# Patient Record
Sex: Male | Born: 1950
Health system: Southern US, Community
[De-identification: ages and names within clinical notes are randomized; demographics above are authoritative.]

## PROBLEM LIST (undated history)

## (undated) DIAGNOSIS — E785 Hyperlipidemia, unspecified: Secondary | ICD-10-CM

## (undated) DIAGNOSIS — E119 Type 2 diabetes mellitus without complications: Secondary | ICD-10-CM

## (undated) DIAGNOSIS — Z8701 Personal history of pneumonia (recurrent): Secondary | ICD-10-CM

## (undated) DIAGNOSIS — J45909 Unspecified asthma, uncomplicated: Secondary | ICD-10-CM

## (undated) DIAGNOSIS — R011 Cardiac murmur, unspecified: Secondary | ICD-10-CM

## (undated) DIAGNOSIS — T7840XA Allergy, unspecified, initial encounter: Secondary | ICD-10-CM

## (undated) DIAGNOSIS — I2510 Atherosclerotic heart disease of native coronary artery without angina pectoris: Secondary | ICD-10-CM

## (undated) DIAGNOSIS — I10 Essential (primary) hypertension: Secondary | ICD-10-CM

## (undated) DIAGNOSIS — I219 Acute myocardial infarction, unspecified: Secondary | ICD-10-CM

## (undated) HISTORY — DX: Atherosclerotic heart disease of native coronary artery without angina pectoris: I25.10

## (undated) HISTORY — DX: Hyperlipidemia, unspecified: E78.5

## (undated) HISTORY — DX: Essential (primary) hypertension: I10

## (undated) HISTORY — DX: Allergy, unspecified, initial encounter: T78.40XA

## (undated) HISTORY — DX: Type 2 diabetes mellitus without complications: E11.9

## (undated) HISTORY — PX: COLONOSCOPY: SHX174

## (undated) HISTORY — DX: Cardiac murmur, unspecified: R01.1

## (undated) HISTORY — DX: Acute myocardial infarction, unspecified: I21.9

## (undated) HISTORY — PX: UPPER GASTROINTESTINAL ENDOSCOPY: SHX188

## (undated) HISTORY — DX: Unspecified asthma, uncomplicated: J45.909

## (undated) HISTORY — PX: ADENOIDECTOMY: SUR15

## (undated) HISTORY — PX: VASECTOMY: SHX75

## (undated) HISTORY — PX: TONSILLECTOMY: SUR1361

---

## 2002-07-01 HISTORY — PX: TYMPANIC MEMBRANE REPAIR: SHX294

## 2009-07-01 DIAGNOSIS — I219 Acute myocardial infarction, unspecified: Secondary | ICD-10-CM

## 2009-07-01 HISTORY — PX: CORONARY STENT PLACEMENT: SHX1402

## 2009-07-01 HISTORY — DX: Acute myocardial infarction, unspecified: I21.9

## 2009-07-01 HISTORY — PX: CORONARY ARTERY BYPASS GRAFT: SHX141

## 2009-10-02 ENCOUNTER — Emergency Department (HOSPITAL_COMMUNITY): Admission: EM | Admit: 2009-10-02 | Discharge: 2009-10-02 | Payer: Self-pay | Admitting: Family Medicine

## 2009-10-02 ENCOUNTER — Telehealth: Payer: Self-pay | Admitting: Internal Medicine

## 2009-10-02 ENCOUNTER — Ambulatory Visit: Payer: Self-pay | Admitting: Cardiovascular Disease

## 2009-10-02 ENCOUNTER — Inpatient Hospital Stay (HOSPITAL_COMMUNITY): Admission: RE | Admit: 2009-10-02 | Discharge: 2009-10-05 | Payer: Self-pay | Admitting: Cardiovascular Disease

## 2009-10-04 ENCOUNTER — Encounter: Payer: Self-pay | Admitting: Cardiovascular Disease

## 2009-10-09 ENCOUNTER — Ambulatory Visit: Payer: Self-pay | Admitting: Internal Medicine

## 2009-10-09 DIAGNOSIS — E119 Type 2 diabetes mellitus without complications: Secondary | ICD-10-CM

## 2009-10-09 DIAGNOSIS — E785 Hyperlipidemia, unspecified: Secondary | ICD-10-CM

## 2009-10-09 DIAGNOSIS — I252 Old myocardial infarction: Secondary | ICD-10-CM | POA: Insufficient documentation

## 2009-10-09 DIAGNOSIS — I10 Essential (primary) hypertension: Secondary | ICD-10-CM

## 2009-10-11 DIAGNOSIS — I2510 Atherosclerotic heart disease of native coronary artery without angina pectoris: Secondary | ICD-10-CM | POA: Insufficient documentation

## 2009-10-31 ENCOUNTER — Ambulatory Visit: Payer: Self-pay | Admitting: Cardiovascular Disease

## 2009-11-23 ENCOUNTER — Telehealth: Payer: Self-pay | Admitting: Internal Medicine

## 2010-01-17 ENCOUNTER — Ambulatory Visit: Payer: Self-pay | Admitting: Internal Medicine

## 2010-01-17 LAB — CONVERTED CEMR LAB
ALT: 27 units/L (ref 0–53)
AST: 19 units/L (ref 0–37)
Albumin: 4.1 g/dL (ref 3.5–5.2)
BUN: 21 mg/dL (ref 6–23)
Bilirubin, Direct: 0.2 mg/dL (ref 0.0–0.3)
Calcium: 9 mg/dL (ref 8.4–10.5)
Cholesterol, target level: 200 mg/dL
HDL goal, serum: 40 mg/dL
LDL Goal: 70 mg/dL
Sodium: 140 meq/L (ref 135–145)
Total Bilirubin: 0.7 mg/dL (ref 0.3–1.2)

## 2010-01-22 ENCOUNTER — Encounter: Payer: Self-pay | Admitting: Internal Medicine

## 2010-01-23 ENCOUNTER — Encounter: Payer: Self-pay | Admitting: Cardiovascular Disease

## 2010-04-18 ENCOUNTER — Ambulatory Visit: Payer: Self-pay | Admitting: Internal Medicine

## 2010-04-18 ENCOUNTER — Telehealth: Payer: Self-pay | Admitting: Internal Medicine

## 2010-04-18 LAB — CONVERTED CEMR LAB
ALT: 26 units/L (ref 0–53)
AST: 19 units/L (ref 0–37)
BUN: 19 mg/dL (ref 6–23)
Basophils Absolute: 0 10*3/uL (ref 0.0–0.1)
Calcium: 9.4 mg/dL (ref 8.4–10.5)
Cholesterol: 77 mg/dL (ref 0–200)
Creatinine, Ser: 1.1 mg/dL (ref 0.4–1.5)
Glucose, Bld: 187 mg/dL — ABNORMAL HIGH (ref 70–99)
HCT: 43.4 % (ref 39.0–52.0)
Hemoglobin, Urine: NEGATIVE
Hemoglobin: 14.8 g/dL (ref 13.0–17.0)
Leukocytes, UA: NEGATIVE
Lymphs Abs: 1.7 10*3/uL (ref 0.7–4.0)
MCHC: 34.1 g/dL (ref 30.0–36.0)
Monocytes Absolute: 0.5 10*3/uL (ref 0.1–1.0)
Neutro Abs: 3.2 10*3/uL (ref 1.4–7.7)
Nitrite: NEGATIVE
PSA: 0.48 ng/mL (ref 0.10–4.00)
RBC: 5.26 M/uL (ref 4.22–5.81)
RDW: 13.6 % (ref 11.5–14.6)
Specific Gravity, Urine: <=1.005 (ref 1.000–1.030)
Total Protein, Urine: NEGATIVE mg/dL
Triglycerides: 84 mg/dL (ref 0.0–149.0)
VLDL: 16.8 mg/dL (ref 0.0–40.0)
WBC: 5.6 10*3/uL (ref 4.5–10.5)
pH: 6 (ref 5.0–8.0)

## 2010-05-18 ENCOUNTER — Ambulatory Visit (HOSPITAL_COMMUNITY): Admission: RE | Admit: 2010-05-18 | Discharge: 2010-05-18 | Payer: Self-pay | Admitting: Cardiovascular Disease

## 2010-05-18 ENCOUNTER — Ambulatory Visit: Payer: Self-pay | Admitting: Cardiovascular Disease

## 2010-05-18 ENCOUNTER — Ambulatory Visit: Payer: Self-pay | Admitting: Cardiology

## 2010-05-18 DIAGNOSIS — I2589 Other forms of chronic ischemic heart disease: Secondary | ICD-10-CM

## 2010-06-19 ENCOUNTER — Ambulatory Visit: Payer: Self-pay | Admitting: Internal Medicine

## 2010-07-03 ENCOUNTER — Encounter: Payer: Self-pay | Admitting: Internal Medicine

## 2010-07-04 ENCOUNTER — Encounter: Payer: Self-pay | Admitting: Internal Medicine

## 2010-07-31 NOTE — Letter (Signed)
Summary: Results Follow-up Letter  [facility]  [address] [place], [place] [address]   Phone: [phone]  Fax: [phone]    01/17/2010  [address] [place], [place]  [address]  Dear Mr. Babe,   The following are the results of your recent test(s):  Test     Result     Blood sugars   still too high Liver/kidney   normal   _________________________________________________________  Please call for an appointment soon to work on the Diabetes _________________________________________________________ _________________________________________________________ _________________________________________________________  Sincerely,  Sanda Linger MD [facility]

## 2010-07-31 NOTE — Progress Notes (Signed)
  Phone Note Refill Request Message from:  Fax from Pharmacy on Nov 23, 2009 10:54 AM  Refills Requested: Medication #1:  METFORMIN HCL 500 MG TABS Take 1 tablet by mouth two times a day Initial call taken by: Rock Nephew CMA,  Nov 23, 2009 10:54 AM    Prescriptions: METFORMIN HCL 500 MG TABS (METFORMIN HCL) Take 1 tablet by mouth two times a day  #60 x 5   Entered by:   Rock Nephew CMA   Authorized by:   Etta Grandchild MD   Signed by:   Rock Nephew CMA on 11/23/2009   Method used:   Electronically to        [id number] (retail)       [address]       [place], [place]  [address]       Ph: [phone] or [phone]       Fax: [phone]   RxID:   [id number]

## 2010-07-31 NOTE — Assessment & Plan Note (Signed)
Summary: CPX/WILL COME FASTING/CD   Vital Signs:  Patient profile:   60 year old male Height:      69 inches Weight:      197 pounds BMI:     29.20 O2 Sat:      97 % on Room air Temp:     98.2 degrees F oral Pulse rate:   56 / minute Pulse rhythm:   regular Resp:     16 per minute BP sitting:   122 / 76  (left arm) Cuff size:   large  Vitals Entered By: Rock Nephew CMA (April 18, 2010 9:04 AM)  Nutrition Counseling: Patient's BMI is greater than 25 and therefore counseled on weight management options.  O2 Flow:  Room air CC: CPX w/ labs, Preventive Care, Lipid Management, Hypertension Management Is Patient Diabetic? Yes Did you bring your meter with you today? No Pain Assessment Patient in pain? no       Does patient need assistance? Functional Status Self care Ambulation Normal   Primary Care Provider:  Etta Grandchild MD  CC:  CPX w/ labs, Preventive Care, Lipid Management, and Hypertension Management.  History of Present Illness: He returns for a complete physical but he also expresses concern about his blood sugar being consistently around 200 for the last few months.  Hypertension History:      He denies headache, chest pain, palpitations, dyspnea with exertion, orthopnea, PND, peripheral edema, visual symptoms, neurologic problems, syncope, and side effects from treatment.  He notes no problems with any antihypertensive medication side effects.        Positive major cardiovascular risk factors include male age 78 years old or older, diabetes, hyperlipidemia, and hypertension.  Negative major cardiovascular risk factors include negative family history for ischemic heart disease and non-tobacco-user status.        Positive history for target organ damage include ASHD (either angina/prior MI/prior CABG).  Further assessment for target organ damage reveals no history of cardiac end-organ damage (CHF/LVH), stroke/TIA, peripheral vascular disease, renal  insufficiency, or hypertensive retinopathy.    Lipid Management History:      Positive NCEP/ATP III risk factors include male age 45 years old or older, diabetes, hypertension, and ASHD (either angina/prior MI/prior CABG).  Negative NCEP/ATP III risk factors include no family history for ischemic heart disease, non-tobacco-user status, no prior stroke/TIA, no peripheral vascular disease, and no history of aortic aneurysm.        The patient states that he knows about the "Therapeutic Lifestyle Change" diet.  His compliance with the TLC diet is good.  The patient expresses understanding of adjunctive measures for cholesterol lowering.  Adjunctive measures started by the patient include aerobic exercise, fiber, omega-3 supplements, limit alcohol consumpton, and weight reduction.  He expresses no side effects from his lipid-lowering medication.  The patient denies any symptoms to suggest myopathy or liver disease.     Preventive Screening-Counseling & Management  Alcohol-Tobacco     Alcohol drinks/day: <1     Alcohol type: all     >5/day in last 3 mos: no     Alcohol Counseling: not indicated; use of alcohol is not excessive or problematic     Feels need to cut down: no     Feels annoyed by complaints: no     Feels guilty re: drinking: no     Needs 'eye opener' in am: no     Smoking Status: quit     Tobacco Counseling: to remain off tobacco products  Hep-HIV-STD-Contraception     Hepatitis Risk: no risk noted     HIV Risk: no risk noted     STD Risk: no risk noted     Dental Visit-last 6 months yes     Dental Care Counseling: to seek dental care; no dental care within six months     TSE monthly: yes     Testicular SE Education/Counseling to perform regular STE     Sun Exposure-Excessive: yes  Safety-Violence-Falls     Seat Belt Use: yes     Helmet Use: n/a     Firearms in the Home: no firearms in the home     Smoke Detectors: yes     Violence in the Home: no risk noted     Sexual  Abuse: no      Sexual History:  currently monogamous.        Drug Use:  no.        Blood Transfusions:  no.    Clinical Review Panels:  Prevention   Last Colonoscopy:  Normal; [facility] in [place]-no records yet (04/16/2005)  Immunizations   Last Tetanus Booster:  Historical (01/17/2010)  Diabetes Management   HgBA1C:  8.7 (01/17/2010)   Creatinine:  1.2 (01/17/2010)   Last Dilated Eye Exam:  normal (01/22/2010)   Last Foot Exam:  yes (04/18/2010)  Complete Metabolic Panel   Glucose:  140 (01/17/2010)   Sodium:  140 (01/17/2010)   Potassium:  4.9 (01/17/2010)   Chloride:  109 (01/17/2010)   CO2:  28 (01/17/2010)   BUN:  21 (01/17/2010)   Creatinine:  1.2 (01/17/2010)   Albumin:  4.1 (01/17/2010)   Total Protein:  6.3 (01/17/2010)   Calcium:  9.0 (01/17/2010)   Total Bili:  0.7 (01/17/2010)   Alk Phos:  55 (01/17/2010)   SGPT (ALT):  27 (01/17/2010)   SGOT (AST):  19 (01/17/2010)   Medications Prior to Update: 1)  Coreg 25 Mg Tabs (Carvedilol) .... Take 1 Tablet By Mouth Two Times A Day 2)  Lisinopril 10 Mg Tabs (Lisinopril) .... Take 1 Tablet By Mouth Once A Day 3)  Metformin Hcl 500 Mg Tabs (Metformin Hcl) .... Take 1 Tablet By Mouth Two Times A Day 4)  Nitrostat 0.4 Mg Subl (Nitroglycerin) .... As Needed Chest Pain 5)  Effient 10 Mg Tabs (Prasugrel Hcl) .... Take 1 Tablet By Mouth Once A Day 6)  Crestor 40 Mg Tabs (Rosuvastatin Calcium) .... Take 1 Tablet By Mouth Once A Day 7)  Aspirin Ec 325 Mg Tbec (Aspirin) .... Take One Tablet By Mouth Daily 8)  Fish Oil 1000 Mg Caps (Omega-3 Fatty Acids)  Current Medications (verified): 1)  Coreg 25 Mg Tabs (Carvedilol) .... Take 1 Tablet By Mouth Two Times A Day 2)  Lisinopril 10 Mg Tabs (Lisinopril) .... Take 1 Tablet By Mouth Once A Day 3)  Metformin Hcl 500 Mg Tabs (Metformin Hcl) .... Take 1 Tablet By Mouth Two Times A Day 4)  Nitrostat 0.4 Mg Subl (Nitroglycerin) .... As Needed Chest Pain 5)  Effient 10 Mg Tabs  (Prasugrel Hcl) .... Take 1 Tablet By Mouth Once A Day 6)  Crestor 40 Mg Tabs (Rosuvastatin Calcium) .... Take 1 Tablet By Mouth Once A Day 7)  Aspirin Ec 325 Mg Tbec (Aspirin) .... Take One Tablet By Mouth Daily 8)  Fish Oil 1000 Mg Caps (Omega-3 Fatty Acids) 9)  Kombiglyze Xr 10-998 Mg Xr24h-Tab (Saxagliptin-Metformin) .... One By Mouth Once Daily For Diabetes  Allergies (verified): No Known  Drug Allergies  Past History:  Past Medical History: Last updated: 10/31/2009 MYOCARDIAL INFARCTION, HX OF (ICD-412) CORONARY ARTERY DISEASE (ICD-414.00) s/p lateral MI 4/11, s/p Promus DES OM2. HYPERTENSION (ICD-401.9) HYPERLIPIDEMIA (ICD-272.4) FAMILY HISTORY DIABETES 1ST DEGREE RELATIVE (ICD-V18.0) FAMILY HISTORY OF CAD MALE 1ST DEGREE RELATIVE <50 (ICD-V17.3) DIABETES MELLITUS, TYPE II (ICD-250.00)    Past Surgical History: Last updated: 10/31/2009 PTCA/stent Vasectomy Tonsillectomy Adenoidectomy Eardrum repair Wisdom tooth removal  Family History: Last updated: 10/31/2009 Family History of CAD Male 1st degree relative <50 Family History Diabetes 1st degree relative Family History Hypertension Mother alive, Aortic valve and root replacement Father-deceased age 82 MI Sister alive, CAD, MI Brother alive, CAD, MI Brother deceased MVA  Social History: Last updated: 10/31/2009 Occupation: Writer Former tobacco-stopped 1979 Married, 3 children Alcohol use-yes, social Drug use-no Regular exercise-yes  Risk Factors: Alcohol Use: <1 (04/18/2010) >5 drinks/d w/in last 3 months: no (04/18/2010) Exercise: yes (10/09/2009)  Risk Factors: Smoking Status: quit (04/18/2010)  Family History: Reviewed history from 10/31/2009 and no changes required. Family History of CAD Male 1st degree relative <50 Family History Diabetes 1st degree relative Family History Hypertension Mother alive, Aortic valve and root replacement Father-deceased age 59 MI Sister alive,  CAD, MI Brother alive, CAD, MI Brother deceased MVA  Social History: Reviewed history from 10/31/2009 and no changes required. 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  The patient denies anorexia, fever, weight loss, chest pain, syncope, dyspnea on exertion, peripheral edema, prolonged cough, headaches, hemoptysis, abdominal pain, melena, hematochezia, severe indigestion/heartburn, hematuria, suspicious skin lesions, transient blindness, difficulty walking, depression, abnormal bleeding, enlarged lymph nodes, angioedema, and testicular masses.   GU:  Denies decreased libido, discharge, dysuria, erectile dysfunction, genital sores, hematuria, incontinence, nocturia, urinary frequency, and urinary hesitancy. Endo:  Denies cold intolerance, excessive hunger, excessive thirst, excessive urination, heat intolerance, and polyuria.  Physical Exam  General:  alert, well-developed, well-nourished, well-hydrated, and overweight-appearing.   Head:  normocephalic, atraumatic, no abnormalities observed, and no abnormalities palpated.   Eyes:  No corneal or conjunctival inflammation noted. EOMI. Perrla. Funduscopic exam benign, without hemorrhages, exudates or papilledema. Vision grossly normal. Mouth:  Oral mucosa and oropharynx without lesions or exudates.  Teeth in good repair. Neck:  supple, full ROM, no masses, no thyromegaly, no thyroid nodules or tenderness, no JVD, normal carotid upstroke, no carotid bruits, no cervical lymphadenopathy, and no neck tenderness.   Breasts:  No masses or gynecomastia noted Lungs:  normal respiratory effort, no intercostal retractions, no accessory muscle use, normal breath sounds, no dullness, no fremitus, no crackles,  and no wheezes.   Heart:  normal rate, regular rhythm, no murmur, no gallop, no rub, and no JVD.   Abdomen:  soft, non-tender, normal bowel sounds, no distention, no masses, no guarding, no rigidity, no rebound tenderness, no abdominal hernia, no inguinal hernia, no hepatomegaly, and no splenomegaly.   Rectal:  No external abnormalities noted. Normal sphincter tone. No rectal masses or tenderness. Genitalia:  circumcised, no hydrocele, no varicocele, no scrotal masses, no testicular masses or atrophy, no cutaneous lesions, and no urethral discharge.   Prostate:  Prostate gland firm and smooth, no enlargement, nodularity, tenderness, mass, asymmetry or induration. Msk:  No deformity or scoliosis noted of thoracic or lumbar spine.  Pulses:  R and L carotid,radial,femoral,dorsalis pedis and posterior tibial pulses are full and equal bilaterally Extremities:  No clubbing, cyanosis, edema, or deformity noted with normal full range of motion of all joints.   Neurologic:  No cranial nerve deficits noted. Station and gait are normal. Plantar reflexes are down-going bilaterally. DTRs are symmetrical throughout. Sensory, motor and coordinative functions appear intact. Skin:  Intact without suspicious lesions or rashes Cervical Nodes:  no anterior cervical adenopathy and no posterior cervical adenopathy.   Axillary Nodes:  no R axillary adenopathy and no L axillary adenopathy.   Inguinal Nodes:  no R inguinal adenopathy and no L inguinal adenopathy.   Psych:  Cognition and judgment appear intact. Alert and cooperative with normal attention span and concentration. No apparent delusions, illusions, hallucinations  Diabetes Management Exam:    Foot Exam (with socks and/or shoes not present):       Sensory-Pinprick/Light touch:          Left medial foot (L-4): normal          Left dorsal foot (L-5): normal          Left lateral foot (S-1): normal          Right medial foot (L-4): normal          Right  dorsal foot (L-5): normal          Right lateral foot (S-1): normal       Sensory-Monofilament:          Left foot: normal          Right foot: normal       Inspection:          Left foot: normal          Right foot: normal       Nails:          Left foot: normal          Right foot: normal   Impression & Recommendations:  Problem # 1:  ROUTINE GENERAL MEDICAL EXAM@HEALTH  CARE FACL (ICD-V70.0) Assessment Unchanged  Orders: Venipuncture (62831) TLB-Lipid Panel (80061-LIPID) TLB-BMP (Basic Metabolic Panel-BMET) (80048-METABOL) TLB-CBC Platelet - w/Differential (85025-CBCD) TLB-Hepatic/Liver Function Pnl (80076-HEPATIC) TLB-TSH (Thyroid Stimulating Hormone) (84443-TSH) TLB-A1C / Hgb A1C (Glycohemoglobin) (83036-A1C) TLB-PSA (Prostate Specific Antigen) (84153-PSA) TLB-Udip w/ Micro (81001-URINE) Gastroenterology Referral (GI) Hemoccult Guaiac-1 spec.(in office) (82270)  Colonoscopy: Normal; [facility] in [place]-no records yet (04/16/2005) Td Booster: Historical (01/17/2010)   HgbA1C: 8.7 (01/17/2010)    Discussed using sunscreen, use of alcohol, drug use, self testicular exam, routine dental care, routine eye care, routine physical exam, seat belts, multiple vitamins,  and recommendations for immunizations.  Discussed exercise and checking cholesterol.   Also recommend checking PSA.  Problem # 2:  DIABETES MELLITUS, TYPE II (ICD-250.00) Assessment: Deteriorated  His updated medication list for this problem includes:    Lisinopril 10 Mg Tabs (Lisinopril) .Marland Kitchen... Take 1 tablet by mouth once a day    Metformin Hcl 500 Mg Tabs (Metformin hcl) .Marland Kitchen... Take 1 tablet by mouth two times a day    Aspirin Ec 325 Mg Tbec (Aspirin) .Marland Kitchen... Take one tablet by mouth daily    Kombiglyze Xr 10-998 Mg Xr24h-tab (Saxagliptin-metformin) ..... One by mouth once daily for diabetes  Orders: Venipuncture (51761) TLB-Lipid Panel (80061-LIPID) TLB-BMP (Basic Metabolic Panel-BMET) (80048-METABOL) TLB-CBC  Platelet - w/Differential (85025-CBCD) TLB-Hepatic/Liver Function Pnl (80076-HEPATIC) TLB-TSH (Thyroid Stimulating Hormone) (84443-TSH) TLB-A1C / Hgb A1C (Glycohemoglobin) (83036-A1C) TLB-PSA (Prostate Specific Antigen) (84153-PSA) TLB-Udip w/ Micro (81001-URINE)  Labs Reviewed: Creat: 1.2 (01/17/2010)     Last Eye Exam: normal (01/22/2010) Reviewed HgBA1c results: 8.7 (01/17/2010)  Problem # 3:  HYPERTENSION (ICD-401.9) Assessment: Improved  His updated medication list for this problem includes:    Coreg 25 Mg Tabs (Carvedilol) .Marland Kitchen... Take 1 tablet by mouth two times a day    Lisinopril 10 Mg Tabs (Lisinopril) .Marland Kitchen... Take 1 tablet by mouth once a day  Orders: Venipuncture ([address]) TLB-Lipid Panel (80061-LIPID) TLB-BMP (Basic Metabolic Panel-BMET) (80048-METABOL) TLB-CBC Platelet - w/Differential (85025-CBCD) TLB-Hepatic/Liver Function Pnl (80076-HEPATIC) TLB-TSH (Thyroid Stimulating Hormone) (84443-TSH) TLB-A1C / Hgb A1C (Glycohemoglobin) (83036-A1C) TLB-PSA (Prostate Specific Antigen) (84153-PSA) TLB-Udip w/ Micro (81001-URINE)  BP today: 122/76 Prior BP: 128/80 (01/17/2010)  Prior 10 Yr Risk Heart Disease: N/A (01/17/2010)  Labs Reviewed: K+: 4.9 (01/17/2010) Creat: : 1.2 (01/17/2010)     Problem # 4:  HYPERLIPIDEMIA (ICD-272.4) Assessment: Unchanged  His updated medication list for this problem includes:    Crestor 40 Mg Tabs (Rosuvastatin calcium) .Marland Kitchen... Take 1 tablet by mouth once a day  Orders: Venipuncture ([address]) TLB-Lipid Panel (80061-LIPID) TLB-BMP (Basic Metabolic Panel-BMET) (80048-METABOL) TLB-CBC Platelet - w/Differential (85025-CBCD) TLB-Hepatic/Liver Function Pnl (80076-HEPATIC) TLB-TSH (Thyroid Stimulating Hormone) (84443-TSH) TLB-A1C / Hgb A1C (Glycohemoglobin) (83036-A1C) TLB-PSA (Prostate Specific Antigen) (84153-PSA) TLB-Udip w/ Micro (81001-URINE)  Complete Medication List: 1)  Coreg 25 Mg Tabs (Carvedilol) .... Take 1 tablet by mouth two  times a day 2)  Lisinopril 10 Mg Tabs (Lisinopril) .... Take 1 tablet by mouth once a day 3)  Metformin Hcl 500 Mg Tabs (Metformin hcl) .... Take 1 tablet by mouth two times a day 4)  Nitrostat 0.4 Mg Subl (Nitroglycerin) .... As needed chest pain 5)  Effient 10 Mg Tabs (Prasugrel hcl) .... Take 1 tablet by mouth once a day 6)  Crestor 40 Mg Tabs (Rosuvastatin calcium) .... Take 1 tablet by mouth once a day 7)  Aspirin Ec 325 Mg Tbec (Aspirin) .... Take one tablet by mouth daily 8)  Fish Oil 1000 Mg Caps (Omega-3 fatty acids) 9)  Kombiglyze Xr 10-998 Mg Xr24h-tab (Saxagliptin-metformin) .... One by mouth once daily for diabetes  Hypertension Assessment/Plan:      The patient's hypertensive risk group is category C: Target organ damage and/or diabetes.  Today's blood pressure is 122/76.  His blood pressure goal is < 130/80.  Lipid Assessment/Plan:      Based on NCEP/ATP III, the patient's risk factor category is "history of coronary disease, peripheral vascular disease, cerebrovascular disease, or aortic aneurysm along with either diabetes, current smoker, or LDL > 130 plus HDL < 40 plus triglycerides > 200".  The patient's lipid goals are as follows: Total cholesterol goal is 200; LDL cholesterol goal is 70; HDL cholesterol goal is 40; Triglyceride goal is 150.    Colorectal Screening:  Current Recommendations:    Hemoccult: NEG X 1 today; Given X 3    Colonoscopy recommended: scheduled with G.I.  PSA Screening:    Reviewed PSA screening recommendations: PSA ordered  Immunization & Chemoprophylaxis:    Tetanus vaccine: Historical  (01/17/2010)   Patient Instructions: 1)  Please schedule a follow-up appointment in 2 months. 2)  It is important that you exercise regularly at least 20 minutes 5 times a week. If you develop chest pain, have severe difficulty breathing, or feel very tired , stop exercising immediately and seek medical attention. 3)  You need to lose weight. Consider a  lower calorie diet and regular exercise.  4)  Schedule a colonoscopy/sigmoidoscopy to  help detect colon cancer. 5)  Check your blood sugars regularly. If your readings are usually above 200 or below 70 you should contact our office. 6)  It is important that your Diabetic A1c level is checked every 3 months. 7)  See your eye doctor yearly to check for diabetic eye damage. 8)  Check your feet each night for sore areas, calluses or signs of infection. 9)  Check your Blood Pressure regularly. If it is above 130/80: you should make an appointment. Prescriptions: KOMBIGLYZE XR 10-998 MG XR24H-TAB (SAXAGLIPTIN-METFORMIN) One by mouth once daily for diabetes  #140 x 0   Entered and Authorized by:   Etta Grandchild MD   Signed by:   Etta Grandchild MD on 04/18/2010   Method used:   Samples Given   RxID:   [id number]    Orders Added: 1)  Venipuncture [36415] 2)  TLB-Lipid Panel [80061-LIPID] 3)  TLB-BMP (Basic Metabolic Panel-BMET) [80048-METABOL] 4)  TLB-CBC Platelet - w/Differential [85025-CBCD] 5)  TLB-Hepatic/Liver Function Pnl [80076-HEPATIC] 6)  TLB-TSH (Thyroid Stimulating Hormone) [84443-TSH] 7)  TLB-A1C / Hgb A1C (Glycohemoglobin) [83036-A1C] 8)  TLB-PSA (Prostate Specific Antigen) [84153-PSA] 9)  TLB-Udip w/ Micro [81001-URINE] 10)  Gastroenterology Referral [GI] 11)  Hemoccult Guaiac-1 spec.(in office) [82270] 12)  Est. Patient 40-64 years [99396] 13)  Est. Patient Level IV [14782]   Immunization History:  Tetanus/Td Immunization History:    Tetanus/Td:  historical (01/17/2010)  Not Administered:    Influenza Vaccine not given due to: declined   Immunization History:  Tetanus/Td Immunization History:    Tetanus/Td:  Historical (01/17/2010)

## 2010-07-31 NOTE — Letter (Signed)
Summary: Results Follow-up Letter  [facility]  [address] [place], [place] [address]   Phone: [phone]  Fax: [phone]    04/18/2010  [address] [place], [place]  [address]  Dear Scott Dodson,   The following are the results of your recent test(s):  Test     Result     Blood sugars   way too high Potassium     high Liver/kidney   normal Thyroid     normal Prostate     normal Urine       normal CBC       normal   _________________________________________________________  Please call for an appointment as soon as possible for a potassium recheck _________________________________________________________ _________________________________________________________ _________________________________________________________  Sincerely,  Sanda Linger MD [facility]

## 2010-07-31 NOTE — Miscellaneous (Signed)
Summary: Appointment Canceled  Appointment status changed to canceled by [employer] on 05/04/2010 3:52 PM.  Cancellation Comments --------------------- echo/414.01,401.0/pla  Appointment Information ----------------------- Appt Type:  CARDIOLOGY ANCILLARY VISIT      Date:  Tuesday, May 08, 2010      Time:  2:00 PM for 60 min   Urgency:  Routine   Made By:  Pearson Grippe  To Visit:  [id number]    Reason:  echo/414.01,401.0/pla  Appt Comments ------------- -- 05/04/10 15:52: ([facility]EM[facility]) CANCELED -- echo/414.01,401.0/pla -- 04/09/10 16:24: (CEM[facility]) BOOKED -- Routine CARDIOLOGY ANCILLARY VISIT at 05/08/2010 2:00 PM for 60 min echo/414.01,401.0/pla

## 2010-07-31 NOTE — Letter (Signed)
Summary: [employer]   Imported By: Marylou Mccoy 03/19/2010 09:25:41  _____________________________________________________________________  External Attachment:    Type:   Image     Comment:   External Document

## 2010-07-31 NOTE — Progress Notes (Signed)
**Note De-identified Aarian Cleaver Obfuscation**       **Note De-Identified Koral Thaden Obfuscation** Prevention & Chronic Care Immunizations   Influenza vaccine: Not documented   Influenza vaccine deferral: Refused  (04/18/2010)    Tetanus booster: 01/17/2010: Historical    Pneumococcal vaccine: Not documented  Colorectal Screening   Hemoccult: Not documented   Hemoccult action/deferral: NEG X 1 today; Given X 3  (04/18/2010)    Colonoscopy: Normal; [facility] in [place] records yet  (04/16/2005)   Colonoscopy action/deferral: scheduled with G.I.  (04/18/2010)  Other Screening   PSA: Not documented   PSA action/deferral: PSA ordered  (04/18/2010)   Smoking status: quit  (04/18/2010)  Diabetes Mellitus   HgbA1C: 8.7  (01/17/2010)    Eye exam: normal  (01/22/2010)   Eye exam due: 01/2011    Foot exam: yes  (04/18/2010)   High risk foot: Not documented   Foot care education: Not documented    Urine microalbumin/creatinine ratio: Not documented  Lipids   Total Cholesterol: Not documented   LDL: Not documented   LDL Direct: Not documented   HDL: Not documented   Triglycerides: Not documented    SGOT (AST): 19  (01/17/2010)   SGPT (ALT): 27  (01/17/2010)   Alkaline phosphatase: 55  (01/17/2010)   Total bilirubin: 0.7  (01/17/2010)  Hypertension   Last Blood Pressure: 122 / 76  (04/18/2010)   Serum creatinine: 1.2  (01/17/2010)   Serum potassium 4.9  (01/17/2010)  Self-Management Support :    Diabetes self-management support: Not documented    Hypertension self-management support: Not documented    Lipid self-management support: Not documented

## 2010-07-31 NOTE — Letter (Signed)
Summary: [facility]   Imported By: Sherian Rein 01/26/2010 12:12:41  _____________________________________________________________________  External Attachment:    Type:   Image     Comment:   External Document

## 2010-07-31 NOTE — Letter (Signed)
Summary: Lipid Letter  [facility]  [address] [place], [place] [address]   Phone: [phone]  Fax: [phone]    04/18/2010  Scott Dodson [address] [place], [place]  [address]  Dear Scott Dodson:  We have carefully reviewed your last lipid profile from 04/18/2010 and the results are noted below with a summary of recommendations for lipid management.    Cholesterol:       77     Goal: <200   HDL "good" Cholesterol:   57.84     Goal: >40   LDL "bad" Cholesterol:   31     Goal: <70   Triglycerides:       84.0     Goal: <150        TLC Diet (Therapeutic Lifestyle Change): Saturated Fats & Transfatty acids should be kept < 7% of total calories ***Reduce Saturated Fats Polyunstaurated Fat can be up to 10% of total calories Monounsaturated Fat Fat can be up to 20% of total calories Total Fat should be no greater than 25-35% of total calories Carbohydrates should be 50-60% of total calories Protein should be approximately 15% of total calories Fiber should be at least 20-30 grams a day ***Increased fiber may help lower LDL Total Cholesterol should be < 200mg /day Consider adding plant stanol/sterols to diet (example: Benacol spread) ***A higher intake of unsaturated fat may reduce Triglycerides and Increase HDL    Adjunctive Measures (may lower LIPIDS and reduce risk of Heart Attack) include: Aerobic Exercise (20-30 minutes 3-4 times a week) Limit Alcohol Consumption Weight Reduction Aspirin 75-81 mg a day by mouth (if not allergic or contraindicated) Dietary Fiber 20-30 grams a day by mouth     Current Medications: 1)    Coreg 25 Mg Tabs (Carvedilol) .... Take 1 tablet by mouth two times a day 2)    Lisinopril 10 Mg Tabs (Lisinopril) .... Take 1 tablet by mouth once a day 3)    Metformin Hcl 500 Mg Tabs (Metformin hcl) .... Take 1 tablet by mouth two times a day 4)    Nitrostat 0.4 Mg Subl (Nitroglycerin) .... As needed chest pain 5)    Effient 10 Mg  Tabs (Prasugrel hcl) .... Take 1 tablet by mouth once a day 6)    Crestor 40 Mg Tabs (Rosuvastatin calcium) .... Take 1 tablet by mouth once a day 7)    Aspirin Ec 325 Mg Tbec (Aspirin) .... Take one tablet by mouth daily 8)    Fish Oil 1000 Mg Caps (Omega-3 fatty acids) 9)    Kombiglyze Xr 10-998 Mg Xr24h-tab (Saxagliptin-metformin) .... One by mouth once daily for diabetes  If you have any questions, please call. We appreciate being able to work with you.   Sincerely,    [facility] Etta Grandchild MD

## 2010-07-31 NOTE — Assessment & Plan Note (Signed)
Summary: 6 month follow up/CAD-stent 4/11/pla   Visit Type:  6 month follow up Primary Lacrystal Barbe:  Etta Grandchild MD  CC:  no complaints.  History of Present Illness: 60 yo WM with history of HTN, hyperlipidemia and DM admitted 10/02/09 with an acute lateral STEMI . He was cathed by Dr. Excell Seltzer who found a totally occluded second obtuse marginal branch of the Circumflex. A Promus drug eluting stent was placed in the ostium of the second obtuse marginal branch of the Circumflex.  He did well post MI and was discharged home on 10/05/09.  He is here today for follow up. I last saw him in April. He has been doing well. He is tolerating his medications well. He is having no chest pain, SOB, dizziness, palpitations, near syncope or syncope.  His energy level has been normal.   Echo today with low normal LV function, unchanged wall motion abnormality.     Current Medications (verified): 1)  Coreg 25 Mg Tabs (Carvedilol) .... Take 1 Tablet By Mouth Two Times A Day 2)  Lisinopril 10 Mg Tabs (Lisinopril) .... Take 1 Tablet By Mouth Once A Day 3)  Nitrostat 0.4 Mg Subl (Nitroglycerin) .... As Needed Chest Pain 4)  Effient 10 Mg Tabs (Prasugrel Hcl) .... Take 1 Tablet By Mouth Once A Day 5)  Crestor 40 Mg Tabs (Rosuvastatin Calcium) .... Take 1 Tablet By Mouth Once A Day 6)  Aspirin Ec 325 Mg Tbec (Aspirin) .... Take One Tablet By Mouth Daily 7)  Fish Oil 1000 Mg Caps (Omega-3 Fatty Acids) 8)  Kombiglyze Xr 10-998 Mg Xr24h-Tab (Saxagliptin-Metformin) .... One By Mouth Once Daily For Diabetes 9)  Garlic 320 Mg Caps (Garlic) .... Once Daily 10)  M2 Chromium 500 Mcg Tabs (Specialty Vitamins Products) .... Once Daily  Allergies (verified): No Known Drug Allergies  Past History:  Past Medical History: Reviewed history from 10/31/2009 and no changes required. MYOCARDIAL INFARCTION, HX OF (ICD-412) CORONARY ARTERY DISEASE (ICD-414.00) s/p lateral MI 4/11, s/p Promus DES OM2. HYPERTENSION  (ICD-401.9) HYPERLIPIDEMIA (ICD-272.4) FAMILY HISTORY DIABETES 1ST DEGREE RELATIVE (ICD-V18.0) FAMILY HISTORY OF CAD MALE 1ST DEGREE RELATIVE <50 (ICD-V17.3) DIABETES MELLITUS, TYPE II (ICD-250.00)    Social History: Reviewed history from 10/31/2009 and no changes required. Occupation: Writer Former tobacco-stopped 1979 Married, 3 children Alcohol use-yes, social Drug use-no Regular exercise-yes  Review of Systems  The patient denies fatigue, malaise, fever, weight gain/loss, vision loss, decreased hearing, hoarseness, chest pain, palpitations, shortness of breath, prolonged cough, wheezing, sleep apnea, coughing up blood, abdominal pain, blood in stool, nausea, vomiting, diarrhea, heartburn, incontinence, blood in urine, muscle weakness, joint pain, leg swelling, rash, skin lesions, headache, fainting, dizziness, depression, anxiety, enlarged lymph nodes, easy bruising or bleeding, and environmental allergies.    Vital Signs:  Patient profile:   60 year old male Height:      69 inches Weight:      200.50 pounds BMI:     29.72 Pulse rate:   62 / minute BP sitting:   140 / 82  (left arm) Cuff size:   regular  Vitals Entered By: Caralee Ates CMA (May 18, 2010 9:46 AM)  Physical Exam  General:  General: Well developed, well nourished, NAD Musculoskeletal: Muscle strength 5/5 all ext Psychiatric: Mood and affect normal Neck: No JVD, no carotid bruits, no thyromegaly, no lymphadenopathy. Lungs:Clear bilaterally, no wheezes, rhonci, crackles CV: RRR no murmurs, gallops rubs Abdomen: soft, NT, ND, BS present Extremities: No edema, pulses 2+.  Impression & Recommendations:  Problem # 1:  CORONARY ARTERY DISEASE (ICD-414.00) s/p drug eluting stent OM2 in April 2011. He is on ASA and Effient. He will lower his ASA to 81 mg per day. Continue Effient for at least 6 more months. He is to continue his statin, beta blocker and Ace-inhibitor.   His updated  medication list for this problem includes:    Coreg 25 Mg Tabs (Carvedilol) .Marland Kitchen... Take 1 tablet by mouth two times a day    Lisinopril 10 Mg Tabs (Lisinopril) .Marland Kitchen... Take 1 tablet by mouth once a day    Nitrostat 0.4 Mg Subl (Nitroglycerin) .Marland Kitchen... As needed chest pain    Effient 10 Mg Tabs (Prasugrel hcl) .Marland Kitchen... Take 1 tablet by mouth once a day    Aspirin Ec 325 Mg Tbec (Aspirin) .Marland Kitchen... Take one tablet by mouth daily  Problem # 2:  CARDIOMYOPATHY, ISCHEMIC (ICD-414.8) See above. Continue meds. Volume ok. LV function slightly improved.   His updated medication list for this problem includes:    Coreg 25 Mg Tabs (Carvedilol) .Marland Kitchen... Take 1 tablet by mouth two times a day    Lisinopril 10 Mg Tabs (Lisinopril) .Marland Kitchen... Take 1 tablet by mouth once a day    Nitrostat 0.4 Mg Subl (Nitroglycerin) .Marland Kitchen... As needed chest pain    Effient 10 Mg Tabs (Prasugrel hcl) .Marland Kitchen... Take 1 tablet by mouth once a day    Aspirin Ec 325 Mg Tbec (Aspirin) .Marland Kitchen... Take one tablet by mouth daily  Problem # 3:  HYPERTENSION (ICD-401.9) BP at upper limits of normal. He reports good control at home. He will record daily BP at home and check his machine vs manual cuff at his wifes next primary care visit. He will call if his BP is elevated. I would titrate his Ace-inhibitor next if better BP control is needed.   His updated medication list for this problem includes:    Coreg 25 Mg Tabs (Carvedilol) .Marland Kitchen... Take 1 tablet by mouth two times a day    Lisinopril 10 Mg Tabs (Lisinopril) .Marland Kitchen... Take 1 tablet by mouth once a day    Aspirin Ec 325 Mg Tbec (Aspirin) .Marland Kitchen... Take one tablet by mouth daily  Patient Instructions: 1)  Your physician recommends that you schedule a follow-up appointment in: 6 months. 2)  Your physician recommends that you continue on your current medications as directed. Please refer to the Current Medication list given to you today.

## 2010-07-31 NOTE — Assessment & Plan Note (Signed)
Summary: 2 MO ROV /NWS #   Vital Signs:  Patient profile:   60 year old male Height:      69 inches Weight:      196 pounds BMI:     29.05 O2 Sat:      96 % on Room air Temp:     97.2 degrees F oral Pulse rate:   64 / minute Pulse rhythm:   regular Resp:     16 per minute BP sitting:   128 / 80  (left arm) Cuff size:   large  Vitals Entered By: Rock Nephew CMA (January 17, 2010 8:12 AM)  Nutrition Counseling: Patient's BMI is greater than 25 and therefore counseled on weight management options.  O2 Flow:  Room air CC: follow up/ med refills, Lipid Management, Preventive Care Is Patient Diabetic? Yes Did you bring your meter with you today? No Pain Assessment Patient in pain? no        Primary Care Provider:  Etta Grandchild MD  CC:  follow up/ med refills, Lipid Management, and Preventive Care.  History of Present Illness:  Follow-Up Visit      This is a 60 year old man who presents for Follow-up visit.  The patient denies chest pain, palpitations, dizziness, syncope, low blood sugar symptoms, high blood sugar symptoms, edema, SOB, DOE, PND, and orthopnea.  Since the last visit the patient notes no new problems or concerns.  The patient reports taking meds as prescribed, monitoring BP, monitoring blood sugars, and dietary compliance.  When questioned about possible medication side effects, the patient notes none.    Lipid Management History:      Positive NCEP/ATP III risk factors include male age 26 years old or older, diabetes, hypertension, and ASHD (either angina/prior MI/prior CABG).  Negative NCEP/ATP III risk factors include no family history for ischemic heart disease, non-tobacco-user status, no prior stroke/TIA, no peripheral vascular disease, and no history of aortic aneurysm.        The patient states that he knows about the "Therapeutic Lifestyle Change" diet.  His compliance with the TLC diet is good.  The patient expresses understanding of adjunctive measures  for cholesterol lowering.  Adjunctive measures started by the patient include aerobic exercise, fiber, ASA, omega-3 supplements, limit alcohol consumpton, and weight reduction.  He expresses no side effects from his lipid-lowering medication.  The patient denies any symptoms to suggest myopathy or liver disease.     Preventive Screening-Counseling & Management  Alcohol-Tobacco     Alcohol drinks/day: <1     Alcohol type: all     >5/day in last 3 mos: no     Alcohol Counseling: not indicated; use of alcohol is not excessive or problematic     Feels need to cut down: no     Feels annoyed by complaints: no     Feels guilty re: drinking: no     Needs 'eye opener' in am: no     Smoking Status: quit     Tobacco Counseling: to remain off tobacco products  Hep-HIV-STD-Contraception     Hepatitis Risk: no risk noted     HIV Risk: no risk noted     STD Risk: no risk noted      Sexual History:  currently monogamous.        Drug Use:  no.        Blood Transfusions:  no.    Current Medications (verified): 1)  Coreg 25 Mg Tabs (Carvedilol) .... Take  1 Tablet By Mouth Two Times A Day 2)  Lisinopril 10 Mg Tabs (Lisinopril) .... Take 1 Tablet By Mouth Once A Day 3)  Metformin Hcl 500 Mg Tabs (Metformin Hcl) .... Take 1 Tablet By Mouth Two Times A Day 4)  Nitrostat 0.4 Mg Subl (Nitroglycerin) .... As Needed Chest Pain 5)  Effient 10 Mg Tabs (Prasugrel Hcl) .... Take 1 Tablet By Mouth Once A Day 6)  Crestor 40 Mg Tabs (Rosuvastatin Calcium) .... Take 1 Tablet By Mouth Once A Day 7)  Aspirin Ec 325 Mg Tbec (Aspirin) .... Take One Tablet By Mouth Daily 8)  Fish Oil 1000 Mg Caps (Omega-3 Fatty Acids)  Allergies (verified): No Known Drug Allergies  Past History:  Past Medical History: Last updated: 10/31/2009 MYOCARDIAL INFARCTION, HX OF (ICD-412) CORONARY ARTERY DISEASE (ICD-414.00) s/p lateral MI 4/11, s/p Promus DES OM2. HYPERTENSION (ICD-401.9) HYPERLIPIDEMIA (ICD-272.4) FAMILY HISTORY  DIABETES 1ST DEGREE RELATIVE (ICD-V18.0) FAMILY HISTORY OF CAD MALE 1ST DEGREE RELATIVE <50 (ICD-V17.3) DIABETES MELLITUS, TYPE II (ICD-250.00)    Past Surgical History: Last updated: 10/31/2009 PTCA/stent Vasectomy Tonsillectomy Adenoidectomy Eardrum repair Wisdom tooth removal  Family History: Last updated: 10/31/2009 Family History of CAD Male 1st degree relative <50 Family History Diabetes 1st degree relative Family History Hypertension Mother alive, Aortic valve and root replacement Father-deceased age 52 MI Sister alive, CAD, MI Brother alive, CAD, MI Brother deceased MVA  Social History: Last updated: 10/31/2009 Occupation: Writer Former tobacco-stopped 1979 Married, 3 children Alcohol use-yes, social Drug use-no Regular exercise-yes  Risk Factors: Alcohol Use: <1 (01/17/2010) >5 drinks/d w/in last 3 months: no (01/17/2010) Exercise: yes (10/09/2009)  Risk Factors: Smoking Status: quit (01/17/2010)  Family History: Reviewed history from 10/31/2009 and no changes required. Family History of CAD Male 1st degree relative <50 Family History Diabetes 1st degree relative Family History Hypertension Mother alive, Aortic valve and root replacement Father-deceased age 39 MI Sister alive, CAD, MI Brother alive, CAD, MI Brother deceased MVA  Social History: Reviewed history from 10/31/2009 and no changes required. 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    Physical Exam  General:  alert, well-developed, well-nourished, well-hydrated, and overweight-appearing.   Mouth:  Oral mucosa and oropharynx without lesions or exudates.  Teeth in good repair. Neck:  supple, full  ROM, no masses, no thyromegaly, no JVD, normal carotid upstroke, no carotid bruits, and no cervical lymphadenopathy.   Lungs:  normal respiratory effort, no intercostal retractions, no accessory muscle use, normal breath sounds, no dullness, no fremitus, no crackles, and no wheezes.   Heart:  normal rate, regular rhythm, no murmur, no gallop, and no rub.   Abdomen:  soft, non-tender, normal bowel sounds, no distention, no masses, no guarding, no rigidity, no hepatomegaly, and no splenomegaly.   Msk:  No deformity or scoliosis noted of thoracic or lumbar spine.   Pulses:  R and L carotid,radial,femoral,dorsalis pedis and posterior tibial pulses are full and equal bilaterally Extremities:  No clubbing, cyanosis, edema, or deformity noted with normal full range of motion of all joints.   Neurologic:  No cranial nerve deficits noted. Station and gait are normal. Plantar reflexes are down-going bilaterally. DTRs are symmetrical throughout. Sensory, motor and coordinative functions appear intact. Skin:  turgor normal, color normal, no rashes, no suspicious lesions, no  ecchymoses, no petechiae, no purpura, no ulcerations, and no edema.   Cervical Nodes:  no anterior cervical adenopathy and no posterior cervical adenopathy.   Psych:  Cognition and judgment appear intact. Alert and cooperative with normal attention span and concentration. No apparent delusions, illusions, hallucinations  Diabetes Management Exam:    Foot Exam (with socks and/or shoes not present):       Sensory-Pinprick/Light touch:          Left medial foot (L-4): normal          Left dorsal foot (L-5): normal          Left lateral foot (S-1): normal          Right medial foot (L-4): normal          Right dorsal foot (L-5): normal          Right lateral foot (S-1): normal       Sensory-Monofilament:          Left foot: normal          Right foot: normal       Inspection:          Left foot: normal          Right foot: normal        Nails:          Left foot: normal          Right foot: normal    Eye Exam:       Eye Exam done elsewhere          Date: 12/19/2009          Results: normal          Done by: Ashley Royalty   Impression & Recommendations:  Problem # 1:  HYPERTENSION (ICD-401.9) Assessment Improved  His updated medication list for this problem includes:    Coreg 25 Mg Tabs (Carvedilol) .Marland Kitchen... Take 1 tablet by mouth two times a day    Lisinopril 10 Mg Tabs (Lisinopril) .Marland Kitchen... Take 1 tablet by mouth once a day  Orders: Venipuncture (60454) TLB-BMP (Basic Metabolic Panel-BMET) (80048-METABOL) TLB-Hepatic/Liver Function Pnl (80076-HEPATIC) TLB-A1C / Hgb A1C (Glycohemoglobin) (83036-A1C)  BP today: 128/80 Prior BP: 138/85 (10/31/2009)  10 Yr Risk Heart Disease: N/A  Problem # 2:  DIABETES MELLITUS, TYPE II (ICD-250.00) Assessment: Unchanged  His updated medication list for this problem includes:    Lisinopril 10 Mg Tabs (Lisinopril) .Marland Kitchen... Take 1 tablet by mouth once a day    Metformin Hcl 500 Mg Tabs (Metformin hcl) .Marland Kitchen... Take 1 tablet by mouth two times a day    Aspirin Ec 325 Mg Tbec (Aspirin) .Marland Kitchen... Take one tablet by mouth daily  Orders: Venipuncture (09811) TLB-BMP (Basic Metabolic Panel-BMET) (80048-METABOL) TLB-Hepatic/Liver Function Pnl (80076-HEPATIC) TLB-A1C / Hgb A1C (Glycohemoglobin) (83036-A1C)  Last Eye Exam: normal (12/19/2009)  Problem # 3:  HYPERLIPIDEMIA (ICD-272.4) Assessment: Unchanged  His updated medication list for this problem includes:    Crestor 40 Mg Tabs (Rosuvastatin calcium) .Marland Kitchen... Take 1 tablet by mouth once a day  Orders: Venipuncture (91478) TLB-BMP (Basic Metabolic Panel-BMET) (80048-METABOL) TLB-Hepatic/Liver Function Pnl (80076-HEPATIC) TLB-A1C / Hgb A1C (Glycohemoglobin) (83036-A1C)  Lipid Goals: Chol Goal: 200 (01/17/2010)   HDL Goal: 40 (01/17/2010)   LDL Goal: 70 (01/17/2010)   TG Goal: 150 (01/17/2010)  10 Yr Risk Heart Disease: N/A  Problem # 4:   CORONARY ARTERY DISEASE (ICD-414.00) Assessment: Improved  His updated medication list for this problem includes:    Coreg  25 Mg Tabs (Carvedilol) .Marland Kitchen... Take 1 tablet by mouth two times a day    Lisinopril 10 Mg Tabs (Lisinopril) .Marland Kitchen... Take 1 tablet by mouth once a day    Nitrostat 0.4 Mg Subl (Nitroglycerin) .Marland Kitchen... As needed chest pain    Effient 10 Mg Tabs (Prasugrel hcl) .Marland Kitchen... Take 1 tablet by mouth once a day    Aspirin Ec 325 Mg Tbec (Aspirin) .Marland Kitchen... Take one tablet by mouth daily  Lipid Goals: Chol Goal: 200 (01/17/2010)   HDL Goal: 40 (01/17/2010)   LDL Goal: 70 (01/17/2010)   TG Goal: 150 (01/17/2010)  Complete Medication List: 1)  Coreg 25 Mg Tabs (Carvedilol) .... Take 1 tablet by mouth two times a day 2)  Lisinopril 10 Mg Tabs (Lisinopril) .... Take 1 tablet by mouth once a day 3)  Metformin Hcl 500 Mg Tabs (Metformin hcl) .... Take 1 tablet by mouth two times a day 4)  Nitrostat 0.4 Mg Subl (Nitroglycerin) .... As needed chest pain 5)  Effient 10 Mg Tabs (Prasugrel hcl) .... Take 1 tablet by mouth once a day 6)  Crestor 40 Mg Tabs (Rosuvastatin calcium) .... Take 1 tablet by mouth once a day 7)  Aspirin Ec 325 Mg Tbec (Aspirin) .... Take one tablet by mouth daily 8)  Fish Oil 1000 Mg Caps (Omega-3 fatty acids)  Lipid Assessment/Plan:      Based on NCEP/ATP III, the patient's risk factor category is "history of coronary disease, peripheral vascular disease, cerebrovascular disease, or aortic aneurysm along with either diabetes, current smoker, or LDL > 130 plus HDL < 40 plus triglycerides > 200".  The patient's lipid goals are as follows: Total cholesterol goal is 200; LDL cholesterol goal is 70; HDL cholesterol goal is 40; Triglyceride goal is 150.    Colorectal Screening:  Colonoscopy Results:    Date of Exam: 04/16/2005    Results: Normal; [facility] records yet  Patient Instructions: 1)  Please schedule a follow-up appointment in 3 months for a complete  physical. 2)  It is important that you exercise regularly at least 20 minutes 5 times a week. If you develop chest pain, have severe difficulty breathing, or feel very tired , stop exercising immediately and seek medical attention. 3)  You need to lose weight. Consider a lower calorie diet and regular exercise.  4)  Check your blood sugars regularly. If your readings are usually above 200 or below 70 you should contact our office. 5)  It is important that your Diabetic A1c level is checked every 3 months. 6)  See your eye doctor yearly to check for diabetic eye damage. 7)  Check your feet each night for sore areas, calluses or signs of infection. 8)  Check your Blood Pressure regularly. If it is above 130/80: you should make an appointment. Prescriptions: METFORMIN HCL 500 MG TABS (METFORMIN HCL) Take 1 tablet by mouth two times a day  #180 x 3   Entered and Authorized by:   Etta Grandchild MD   Signed by:   Etta Grandchild MD on 01/17/2010   Method used:   Electronically to        [facility]  [address] [id number] (retail)       [address]       [place], [place]  [address]       Ph: [phone] or [phone]       Fax: [phone]   RxID:  [id number]    Tetanus Vaccine (to be given today)     Appended Document: 2 MO ROV /NWS #    Diabetes Management Exam:    Eye Exam:       Eye Exam done elsewhere          Date: 01/22/2010          Results: normal          Done by: Norm Parcel

## 2010-07-31 NOTE — Assessment & Plan Note (Signed)
Summary: eph/ gd   Visit Type:  Follow-up Primary Provider:  Etta Grandchild MD  CC:  No complaints. .  History of Present Illness: 60 yo WM with history of HTN, hyperlipidemia and DM admitted 10/02/09 with an acute lateral STEMI . He was cathed by Dr. Excell Seltzer who found a totally occluded second obtuse marginal branch of the Circumflex. A Promus drug eluting stent was placed in the ostium of the second obtuse marginal branch of the Circumflex.  He did well post MI and was discharged home on 10/05/09.  He is here today for follow up. He has been doing well since discharge. He is tolerating his medications well. He is having no chest pain, SOB, dizziness, palpitations, near syncope or syncope.  His energy level has been normal. He is walking up to three miles per day. He returned to work and has done well.   Problems Prior to Update: 1)  Myocardial Infarction, Hx of  (ICD-412) 2)  Coronary Artery Disease  (ICD-414.00) 3)  Hypertension  (ICD-401.9) 4)  Hyperlipidemia  (ICD-272.4) 5)  Family History Diabetes 1st Degree Relative  (ICD-V18.0) 6)  Family History of Cad Male 1st Degree Relative <50  (ICD-V17.3) 7)  Diabetes Mellitus, Type II  (ICD-250.00)  Current Medications (verified): 1)  Coreg 25 Mg Tabs (Carvedilol) .... Take 1 Tablet By Mouth Two Times A Day 2)  Lisinopril 10 Mg Tabs (Lisinopril) .... Take 1 Tablet By Mouth Once A Day 3)  Metformin Hcl 500 Mg Tabs (Metformin Hcl) .... Take 1 Tablet By Mouth Two Times A Day 4)  Nitrostat 0.4 Mg Subl (Nitroglycerin) .... As Needed Chest Pain 5)  Effient 10 Mg Tabs (Prasugrel Hcl) .... Take 1 Tablet By Mouth Once A Day 6)  Crestor 40 Mg Tabs (Rosuvastatin Calcium) .... Take 1 Tablet By Mouth Once A Day 7)  Aspirin Ec 325 Mg Tbec (Aspirin) .... Take One Tablet By Mouth Daily  Allergies (verified): No Known Drug Allergies  Past History:  Past Medical History: MYOCARDIAL INFARCTION, HX OF (ICD-412) CORONARY ARTERY DISEASE (ICD-414.00) s/p  lateral MI 4/11, s/p Promus DES OM2. HYPERTENSION (ICD-401.9) HYPERLIPIDEMIA (ICD-272.4) FAMILY HISTORY DIABETES 1ST DEGREE RELATIVE (ICD-V18.0) FAMILY HISTORY OF CAD MALE 1ST DEGREE RELATIVE <50 (ICD-V17.3) DIABETES MELLITUS, TYPE II (ICD-250.00)    Past Surgical History: PTCA/stent Vasectomy Tonsillectomy Adenoidectomy Eardrum repair Wisdom tooth removal  Family History: Reviewed history from 10/09/2009 and no changes required. Family History of CAD Male 1st degree relative <50 Family History Diabetes 1st degree relative Family History Hypertension Mother alive, Aortic valve and root replacement Father-deceased age 71 MI Sister alive, CAD, MI Brother alive, CAD, MI Brother deceased MVA  Social History: Reviewed history from 10/09/2009 and no changes required. Occupation: Writer Former tobacco-stopped 1979 Married, 3 children Alcohol use-yes, social Drug use-no Regular exercise-yes  Review of Systems  The patient denies fatigue, malaise, fever, weight gain/loss, vision loss, decreased hearing, hoarseness, chest pain, palpitations, shortness of breath, prolonged cough, wheezing, sleep apnea, coughing up blood, abdominal pain, blood in stool, nausea, vomiting, diarrhea, heartburn, incontinence, blood in urine, muscle weakness, joint pain, leg swelling, rash, skin lesions, headache, fainting, dizziness, depression, anxiety, enlarged lymph nodes, easy bruising or bleeding, and environmental allergies.    Vital Signs:  Patient profile:   60 year old male Height:      69 inches Weight:      200 pounds BMI:     29.64 Pulse rate:   55 / minute BP sitting:   138 /  85  (left arm) Cuff size:   large  Vitals Entered By: Oswald Hillock (Oct 31, 2009 12:15 PM)  Physical Exam  General:  General: Well developed, well nourished, NAD HEENT: OP clear, mucus membranes moist SKIN: warm, dry Neuro: No focal deficits Musculoskeletal: Muscle strength 5/5 all  ext Psychiatric: Mood and affect normal Neck: No JVD, no carotid bruits, no thyromegaly, no lymphadenopathy. Lungs:Clear bilaterally, no wheezes, rhonci, crackles CV: RRR no murmurs, gallops rubs Abdomen: soft, NT, ND, BS present Extremities: No edema, pulses 2+.    Cardiac Cath  Procedure date:  10/02/2009  Findings:      FINDINGS:  Aortic pressure 163/105 with a mean of 132.   Left ventricular pressure is 164/34.   Coronary angiography:  Left mainstem is patent and there is no significant stenosis present.  The vessel divides into the LAD and left circumflex.   LAD:  The LAD is a large-caliber vessel that reaches the left ventricular apex.  It supplies a large first diagonal branch and small second and third diagonals.  The mid LAD has a 30-40% stenosis and there is mild diffuse plaque with minor luminal irregularities further down in the mid and distal vessel.  There is TIMI 3 flow in the LAD.   Left circumflex:  The left circumflex is dominant.  The vessel has minor irregularities in the proximal and midportions, but no areas of significant stenosis.  The first OM is small.  The second OM is totally occluded and this is the infarct artery.  There is a left posterolateral and left PDA branch present without significant stenosis.   Right coronary artery:  The RCA is a small, nondominant vessel.  There is an 80-90% stenosis in the midportion of the vessel.   Left ventriculography:  The LVEF is 45%.  There is inferoapical hypokinesis.  There is no significant mitral regurgitation.   ASSESSMENT: 1. Inferolateral ST-elevation myocardial infarction secondary to total     occlusion of the second obtuse marginal branch. 2. Successful primary percutaneous coronary intervention with a long     drug-eluting stent. 3. Severe right coronary artery stenosis (small, nondominant vessel). 4. Nonobstructive left anterior descending stenosis. 5. Mild segmental left ventricular  systolic dysfunction with elevated     left ventricular end-diastolic pressure.  EKG  Procedure date:  10/31/2009  Findings:      Sinus brady, rate 49 bpm. T wave inversions inferior, anterolateral.   Echocardiogram  Procedure date:  10/04/2009  Findings:       Left ventricle: The cavity size was normal. Wall thickness was   increased in a pattern of mild LVH. Systolic function was mildly to   moderately reduced. The estimated ejection fraction was in the range   of 40% to 45%. There is severe hypokinesis of the basal-mid   inferoposterior myocardium. Doppler parameters are consistent with   abnormal left ventricular relaxation (grade 1 diastolic   dysfunction).    Impression & Recommendations:  Problem # 1:  CORONARY ARTERY DISEASE (ICD-414.00) s/p recent lateral STEMI. with placement of a drug eluting stent in the second OM. Doing well. Continue ASA and Effient for at least 12 months, but given presentation with STEMI, consider longer therapy. Can reduce ASA to 162 mg per day after three months. Continue beta blocker, Ace-inhibitor, statin.   His updated medication list for this problem includes:    Coreg 25 Mg Tabs (Carvedilol) .Marland Kitchen... Take 1 tablet by mouth two times a day    Lisinopril 10  Mg Tabs (Lisinopril) .Marland Kitchen... Take 1 tablet by mouth once a day    Nitrostat 0.4 Mg Subl (Nitroglycerin) .Marland Kitchen... As needed chest pain    Effient 10 Mg Tabs (Prasugrel hcl) .Marland Kitchen... Take 1 tablet by mouth once a day    Aspirin Ec 325 Mg Tbec (Aspirin) .Marland Kitchen... Take one tablet by mouth daily  Orders: EKG w/ Interpretation (93000)  Problem # 2:  HYPERTENSION (ICD-401.9) Stable. Continue current meds.  His updated medication list for this problem includes:    Coreg 25 Mg Tabs (Carvedilol) .Marland Kitchen... Take 1 tablet by mouth two times a day    Lisinopril 10 Mg Tabs (Lisinopril) .Marland Kitchen... Take 1 tablet by mouth once a day    Aspirin Ec 325 Mg Tbec (Aspirin) .Marland Kitchen... Take one tablet by mouth daily  Problem # 3:   HYPERLIPIDEMIA (ICD-272.4) On statin.  His updated medication list for this problem includes:    Crestor 40 Mg Tabs (Rosuvastatin calcium) .Marland Kitchen... Take 1 tablet by mouth once a day  Patient Instructions: 1)  Your physician recommends that you schedule a follow-up appointment in: 6 months 2)  Your physician has requested that you have an echocardiogram.  Echocardiography is a painless test that uses sound waves to create images of your heart. It provides your doctor with information about the size and shape of your heart and how well your heart's chambers and valves are working.  This procedure takes approximately one hour. There are no restrictions for this procedure. To be done in 6 months

## 2010-07-31 NOTE — Assessment & Plan Note (Signed)
Summary: NEW [facility] PT--PKG--STC   Vital Signs:  Patient profile:   60 year old male Height:      69 inches Weight:      200.25 pounds BMI:     29.68 O2 Sat:      97 % Temp:     98.3 degrees F oral Pulse rate:   60 / minute Pulse rhythm:   regular Resp:     16 per minute BP sitting:   112 / 70  (left arm)  Vitals Entered By: Rock Nephew CMA (October 09, 2009 4:04 PM)  Nutrition Counseling: Patient's BMI is greater than 25 and therefore counseled on weight management options.  Primary Care Provider:  Etta Grandchild MD   History of Present Illness: New to me this gentleman is s/p a stent for ischemia one week ago and says that he is doing well with no CP/DOE/SOB/EDEMA/BLEEDING.  Preventive Screening-Counseling & Management  Alcohol-Tobacco     Alcohol drinks/day: <1     Alcohol type: all     >5/day in last 3 mos: no     Alcohol Counseling: not indicated; use of alcohol is not excessive or problematic     Feels need to cut down: no     Feels annoyed by complaints: no     Feels guilty re: drinking: no     Needs 'eye opener' in am: no     Smoking Status: quit > 6 months  Caffeine-Diet-Exercise     Does Patient Exercise: yes  Hep-HIV-STD-Contraception     Hepatitis Risk: no risk noted     HIV Risk: no risk noted     STD Risk: no risk noted      Sexual History:  currently monogamous.        Drug Use:  no.        Blood Transfusions:  no.    Current Medications (verified): 1)  Coreg 25 Mg Tabs (Carvedilol) .... Take 1 Tablet By Mouth Two Times A Day 2)  Lisinopril 10 Mg Tabs (Lisinopril) .... Take 1 Tablet By Mouth Once A Day 3)  Metformin Hcl 500 Mg Tabs (Metformin Hcl) .... Take 1 Tablet By Mouth Two Times A Day 4)  Nitrostat 0.4 Mg Subl (Nitroglycerin) .... As Needed Chest Pain 5)  Effient 10 Mg Tabs (Prasugrel Hcl) .... Take 1 Tablet By Mouth Once A Day 6)  Crestor 40 Mg Tabs (Rosuvastatin Calcium) .... Take 1 Tablet By Mouth Once A Day  Allergies  (verified): No Known Drug Allergies  Past History:  Past Medical History: Diabetes mellitus, type II Hyperlipidemia Hypertension Myocardial infarction, hx of Coronary artery disease  Past Surgical History: PTCA/stent Vasectomy  Family History: Family History of CAD Male 1st degree relative <50 Family History Diabetes 1st degree relative Family History Hypertension  Social History: Occupation: Writer Married Alcohol use-yes Drug use-no Regular exercise-yes Smoking Status:  quit > 6 months Hepatitis Risk:  no risk noted HIV Risk:  no risk noted STD Risk:  no risk noted Sexual History:  currently monogamous Blood Transfusions:  no Drug Use:  no Does Patient Exercise:  yes  Review of Systems  The patient denies anorexia, weight loss, weight gain, chest pain, syncope, dyspnea on exertion, peripheral edema, prolonged cough, headaches, abdominal pain, hematuria, difficulty walking, depression, and angioedema.   Endo:  Complains of polyuria; denies cold intolerance, excessive hunger, excessive thirst, excessive urination, heat intolerance, and weight change.  Physical Exam  General:  alert, well-developed, well-nourished, well-hydrated, and  overweight-appearing.   Head:  normocephalic, atraumatic, no abnormalities observed, and no abnormalities palpated.   Mouth:  Oral mucosa and oropharynx without lesions or exudates.  Teeth in good repair. Neck:  supple, full ROM, no masses, no thyromegaly, no JVD, normal carotid upstroke, no carotid bruits, and no cervical lymphadenopathy.   Lungs:  normal respiratory effort, no intercostal retractions, no accessory muscle use, normal breath sounds, no dullness, no fremitus, no crackles, and no wheezes.   Heart:  normal rate, regular rhythm, no murmur, no gallop, and no rub.   Abdomen:  soft, non-tender, normal bowel sounds, no distention, no masses, no guarding, no rigidity, no hepatomegaly, and no splenomegaly.   Msk:   No deformity or scoliosis noted of thoracic or lumbar spine.   Pulses:  R and L carotid,radial,femoral,dorsalis pedis and posterior tibial pulses are full and equal bilaterally Extremities:  No clubbing, cyanosis, edema, or deformity noted with normal full range of motion of all joints.   Neurologic:  No cranial nerve deficits noted. Station and gait are normal. Plantar reflexes are down-going bilaterally. DTRs are symmetrical throughout. Sensory, motor and coordinative functions appear intact. Skin:  turgor normal, color normal, no rashes, no suspicious lesions, no ecchymoses, no petechiae, no purpura, no ulcerations, and no edema.   Cervical Nodes:  no anterior cervical adenopathy and no posterior cervical adenopathy.   Axillary Nodes:  no R axillary adenopathy and no L axillary adenopathy.   Inguinal Nodes:  no R inguinal adenopathy and no L inguinal adenopathy.   Psych:  Cognition and judgment appear intact. Alert and cooperative with normal attention span and concentration. No apparent delusions, illusions, hallucinations  Diabetes Management Exam:    Foot Exam (with socks and/or shoes not present):       Sensory-Pinprick/Light touch:          Left medial foot (L-4): normal          Left dorsal foot (L-5): normal          Left lateral foot (S-1): normal          Right medial foot (L-4): normal          Right dorsal foot (L-5): normal          Right lateral foot (S-1): normal       Sensory-Monofilament:          Left foot: normal          Right foot: normal       Inspection:          Left foot: normal          Right foot: normal       Nails:          Left foot: normal          Right foot: normal   Impression & Recommendations:  Problem # 1:  DIABETES MELLITUS, TYPE II (ICD-250.00) Assessment Unchanged his A1C=12.1 in the hospital His updated medication list for this problem includes:    Lisinopril 10 Mg Tabs (Lisinopril) .Marland Kitchen... Take 1 tablet by mouth once a day    Metformin  Hcl 500 Mg Tabs (Metformin hcl) .Marland Kitchen... Take 1 tablet by mouth two times a day  Orders: Ophthalmology Referral (Ophthalmology)  Problem # 2:  HYPERTENSION (ICD-401.9) Assessment: Improved  His updated medication list for this problem includes:    Coreg 25 Mg Tabs (Carvedilol) .Marland Kitchen... Take 1 tablet by mouth two times a day    Lisinopril 10 Mg Tabs (Lisinopril) .Marland KitchenMarland KitchenMarland KitchenMarland Kitchen  Take 1 tablet by mouth once a day  BP today: 112/70  Problem # 3:  HYPERLIPIDEMIA (ICD-272.4) Assessment: Improved  His updated medication list for this problem includes:    Crestor 40 Mg Tabs (Rosuvastatin calcium) .Marland Kitchen... Take 1 tablet by mouth once a day  Complete Medication List: 1)  Coreg 25 Mg Tabs (Carvedilol) .... Take 1 tablet by mouth two times a day 2)  Lisinopril 10 Mg Tabs (Lisinopril) .... Take 1 tablet by mouth once a day 3)  Metformin Hcl 500 Mg Tabs (Metformin hcl) .... Take 1 tablet by mouth two times a day 4)  Nitrostat 0.4 Mg Subl (Nitroglycerin) .... As needed chest pain 5)  Effient 10 Mg Tabs (Prasugrel hcl) .... Take 1 tablet by mouth once a day 6)  Crestor 40 Mg Tabs (Rosuvastatin calcium) .... Take 1 tablet by mouth once a day  Patient Instructions: 1)  Please schedule a follow-up appointment in 2 months. 2)  It is important that you exercise regularly at least 20 minutes 5 times a week. If you develop chest pain, have severe difficulty breathing, or feel very tired , stop exercising immediately and seek medical attention. 3)  You need to lose weight. Consider a lower calorie diet and regular exercise.  4)  Take an Aspirin every day. 5)  Check your blood sugars regularly. If your readings are usually above 200  or below 70 you should contact our office. 6)  It is important that your Diabetic A1c level is checked every 3 months. 7)  See your eye doctor yearly to check for diabetic eye damage. 8)  Check your feet each night for sore areas, calluses or signs of infection. 9)  Check your Blood Pressure  regularly. If it is above 130: 80you should make an appointment.

## 2010-07-31 NOTE — Progress Notes (Signed)
Summary: CHEST PAIN   Phone Note Call from Patient   Summary of Call: Pt is scheduled to be a new patient of Dr Yetta Barre on April 11th. Spoke w/wife. Pt has c/o chest pressure, jaw discomfort and sob off and on since last wednesday. I advised ER. She says he is reluctant to get evaluated but has just recently voiced these symptoms. She will try to bring pt to ER or UC if possible and keep f/u office visit on the 11th.  Initial call taken by: Lamar Sprinkles, CMA,  October 02, 2009 3:01 PM

## 2010-08-02 NOTE — Letter (Signed)
Summary: [facility]   Imported By: Sherian Rein 07/12/2010 14:12:20  _____________________________________________________________________  External Attachment:    Type:   Image     Comment:   External Document

## 2010-08-02 NOTE — Letter (Signed)
Summary: Referral - not able to see patient  [facility]  [address] [place], [place] [address]   Phone: [phone]  Fax: [phone]    July 03, 2010   Sanda Linger, MD [address] [place], [place] [address]    Re:   Scott Dodson DOB:  02-Oct-1950 MRN:   [id number]    Dear Dr. Yetta Barre:  Thank you for your kind referral of the above patient.  We have attempted to schedule the recommended procedure Screening Colonoscopy but have not been able to schedule because:  X   The patient was not available by phone and/or has not returned our calls.  ___ The patient declined to schedule the procedure at this time.  We appreciate the referral and hope that we will have the opportunity to treat this patient in the future.    Sincerely,    [facility] [phone]

## 2010-09-19 LAB — BASIC METABOLIC PANEL
BUN: 11 mg/dL (ref 6–23)
CO2: 25 mEq/L (ref 19–32)
CO2: 26 mEq/L (ref 19–32)
Calcium: 8.8 mg/dL (ref 8.4–10.5)
Calcium: 8.8 mg/dL (ref 8.4–10.5)
Calcium: 8.9 mg/dL (ref 8.4–10.5)
Creatinine, Ser: 0.97 mg/dL (ref 0.4–1.5)
Creatinine, Ser: 0.99 mg/dL (ref 0.4–1.5)
GFR calc Af Amer: >60 mL/min (ref >60)
GFR calc Af Amer: >60 mL/min (ref >60)
GFR calc Af Amer: >60 mL/min (ref >60)
GFR calc non Af Amer: >60 mL/min (ref >60)
GFR calc non Af Amer: >60 mL/min (ref >60)
Glucose, Bld: 278 mg/dL — ABNORMAL HIGH (ref 70–99)
Glucose, Bld: 282 mg/dL — ABNORMAL HIGH (ref 70–99)
Potassium: 4.8 mEq/L (ref 3.5–5.1)
Sodium: 137 mEq/L (ref 135–145)
Sodium: 137 mEq/L (ref 135–145)

## 2010-09-19 LAB — GLUCOSE, CAPILLARY
Glucose-Capillary: 227 mg/dL — ABNORMAL HIGH (ref 70–99)
Glucose-Capillary: 240 mg/dL — ABNORMAL HIGH (ref 70–99)
Glucose-Capillary: 262 mg/dL — ABNORMAL HIGH (ref 70–99)
Glucose-Capillary: 313 mg/dL — ABNORMAL HIGH (ref 70–99)
Glucose-Capillary: 318 mg/dL — ABNORMAL HIGH (ref 70–99)
Glucose-Capillary: 343 mg/dL — ABNORMAL HIGH (ref 70–99)
Glucose-Capillary: 392 mg/dL — ABNORMAL HIGH (ref 70–99)
Glucose-Capillary: 431 mg/dL — ABNORMAL HIGH (ref 70–99)

## 2010-09-19 LAB — POCT I-STAT, CHEM 8
BUN: 17 mg/dL (ref 6–23)
Calcium, Ion: 1.15 mmol/L (ref 1.12–1.32)
Chloride: 97 mEq/L (ref 96–112)
Glucose, Bld: 338 mg/dL — ABNORMAL HIGH (ref 70–99)
HCT: 40 % (ref 39.0–52.0)
Hemoglobin: 13.6 g/dL (ref 13.0–17.0)
Potassium: 3.6 mEq/L (ref 3.5–5.1)
Sodium: 130 mEq/L — ABNORMAL LOW (ref 135–145)
TCO2: 22 mmol/L (ref 0–100)

## 2010-09-19 LAB — LIPID PANEL
Cholesterol: 173 mg/dL (ref 0–200)
HDL: 38 mg/dL — ABNORMAL LOW (ref >39)
LDL Cholesterol: 104 mg/dL — ABNORMAL HIGH (ref 0–99)

## 2010-09-19 LAB — COMPREHENSIVE METABOLIC PANEL
ALT: 50 U/L (ref 0–53)
BUN: 15 mg/dL (ref 6–23)
CO2: 22 mEq/L (ref 19–32)
Chloride: 97 mEq/L (ref 96–112)
GFR calc non Af Amer: >60 mL/min (ref >60)
Glucose, Bld: 345 mg/dL — ABNORMAL HIGH (ref 70–99)
Sodium: 126 mEq/L — ABNORMAL LOW (ref 135–145)
Total Bilirubin: 0.6 mg/dL (ref 0.3–1.2)

## 2010-09-19 LAB — CBC
HCT: 39.6 % (ref 39.0–52.0)
HCT: 41.3 % (ref 39.0–52.0)
Hemoglobin: 15.1 g/dL (ref 13.0–17.0)
MCHC: 33.5 g/dL (ref 30.0–36.0)
MCHC: 33.8 g/dL (ref 30.0–36.0)
MCV: 80.4 fL (ref 78.0–100.0)
MCV: 81 fL (ref 78.0–100.0)
Platelets: 172 10*3/uL (ref 150–400)
Platelets: 226 10*3/uL (ref 150–400)
RDW: 13 % (ref 11.5–15.5)
RDW: 13.2 % (ref 11.5–15.5)
RDW: 13.4 % (ref 11.5–15.5)

## 2010-09-19 LAB — HEMOGLOBIN A1C: Mean Plasma Glucose: 301 mg/dL

## 2010-09-19 LAB — MRSA PCR SCREENING: MRSA by PCR: NEGATIVE

## 2010-09-19 LAB — DIFFERENTIAL
Basophils Absolute: 0 10*3/uL (ref 0.0–0.1)
Lymphocytes Relative: 21 % (ref 12–46)
Monocytes Absolute: 0.9 10*3/uL (ref 0.1–1.0)
Monocytes Relative: 11 % (ref 3–12)

## 2010-09-19 LAB — CARDIAC PANEL(CRET KIN+CKTOT+MB+TROPI)
CK, MB: 2.5 ng/mL (ref 0.3–4.0)
Relative Index: 2 (ref 0.0–2.5)

## 2010-09-19 LAB — TROPONIN I: Troponin I: 8.57 ng/mL (ref 0.00–0.06)

## 2010-09-19 LAB — GLUCOSE, RANDOM: Glucose, Bld: 410 mg/dL — ABNORMAL HIGH (ref 70–99)

## 2010-09-19 LAB — CK TOTAL AND CKMB (NOT AT ARMC)
CK, MB: 4.3 ng/mL — ABNORMAL HIGH (ref 0.3–4.0)
Total CK: 157 U/L (ref 7–232)

## 2011-03-08 ENCOUNTER — Other Ambulatory Visit: Payer: Self-pay | Admitting: *Deleted

## 2011-03-08 MED ORDER — ROSUVASTATIN CALCIUM 40 MG PO TABS: 40.0000 mg | ORAL_TABLET | Freq: Every day | ORAL | Status: DC

## 2011-04-01 ENCOUNTER — Other Ambulatory Visit: Payer: Self-pay | Admitting: Cardiovascular Disease

## 2011-04-02 ENCOUNTER — Other Ambulatory Visit: Payer: Self-pay

## 2011-04-02 MED ORDER — CARVEDILOL 25 MG PO TABS: 25.0000 mg | ORAL_TABLET | Freq: Two times a day (BID) | ORAL | Status: DC

## 2011-04-03 ENCOUNTER — Other Ambulatory Visit: Payer: Self-pay

## 2011-04-03 MED ORDER — ROSUVASTATIN CALCIUM 40 MG PO TABS: 40.0000 mg | ORAL_TABLET | Freq: Every day | ORAL | Status: DC

## 2011-04-03 MED ORDER — LISINOPRIL 10 MG PO TABS: 10.0000 mg | ORAL_TABLET | Freq: Every day | ORAL | Status: DC

## 2011-04-03 MED ORDER — PRASUGREL HCL 10 MG PO TABS: 10.0000 mg | ORAL_TABLET | Freq: Every day | ORAL | Status: DC

## 2011-04-03 MED ORDER — CARVEDILOL 25 MG PO TABS: 25.0000 mg | ORAL_TABLET | Freq: Two times a day (BID) | ORAL | Status: DC

## 2011-04-03 NOTE — Telephone Encounter (Signed)
Medication ordered

## 2012-02-12 ENCOUNTER — Other Ambulatory Visit: Payer: Self-pay | Admitting: Cardiovascular Disease

## 2012-04-19 ENCOUNTER — Other Ambulatory Visit: Payer: Self-pay | Admitting: Cardiovascular Disease

## 2012-04-20 ENCOUNTER — Other Ambulatory Visit: Payer: Self-pay | Admitting: Cardiovascular Disease

## 2012-04-20 ENCOUNTER — Other Ambulatory Visit: Payer: Self-pay | Admitting: *Deleted

## 2012-04-20 NOTE — Telephone Encounter (Signed)
Pt needs to schedule appt with Dr. Clifton James. I called and spoke with him and appt made for May 26, 2012 at 12:15.  He states he does not need refill of Coreg yet and will get refilled when here for visit in November.

## 2012-04-20 NOTE — Telephone Encounter (Signed)
He is due for follow up appt with Dr. Clifton James. I spoke with pt and he is doing well. Appt made for him to see Dr. Clifton James on May 26, 2012 at 12:15.  Will send 3 month supply of Effient with no refills to mail order. He is aware he will need to keep this appt for further refills.

## 2012-05-16 ENCOUNTER — Other Ambulatory Visit: Payer: Self-pay | Admitting: Cardiovascular Disease

## 2012-05-26 ENCOUNTER — Encounter: Payer: Self-pay | Admitting: Cardiovascular Disease

## 2012-05-26 ENCOUNTER — Ambulatory Visit (INDEPENDENT_AMBULATORY_CARE_PROVIDER_SITE_OTHER): Payer: 59 | Admitting: Cardiovascular Disease

## 2012-05-26 VITALS — BP 188/101 | HR 71 | Resp 16 | Ht 69.0 in | Wt 204.8 lb

## 2012-05-26 DIAGNOSIS — I2510 Atherosclerotic heart disease of native coronary artery without angina pectoris: Secondary | ICD-10-CM

## 2012-05-26 DIAGNOSIS — I10 Essential (primary) hypertension: Secondary | ICD-10-CM

## 2012-05-26 MED ORDER — ATORVASTATIN CALCIUM 10 MG PO TABS: 10.0000 mg | ORAL_TABLET | Freq: Every day | ORAL | Status: DC

## 2012-05-26 MED ORDER — LISINOPRIL 10 MG PO TABS: 10.0000 mg | ORAL_TABLET | Freq: Every day | ORAL | Status: DC

## 2012-05-26 NOTE — Progress Notes (Signed)
History of Present Illness: 61 yo WM with history of HTN, hyperlipidemia and DM admitted 10/02/09 with an acute lateral STEMI . He was cathed by Dr. Excell Seltzer who found a totally occluded second obtuse marginal branch of the Circumflex. A Promus drug eluting stent was placed in the ostium of the second obtuse marginal branch of the Circumflex. He did well post MI and was discharged home on 10/05/09. He was last seen in our office November 2011.  He is here today for cardiac follow up.  He has been out of his Crestor and Lisinopril. He is having no chest pain, SOB, dizziness, palpitations, near syncope or syncope. His energy level has been normal. He is walking up to three miles per day. He returned to work and has done well.   Primary Care Physician: Sanda Linger  Last Lipid Profile:  Lipid Panel     Component Value Date/Time   CHOL 77 04/18/2010 0908   TRIG 84.0 04/18/2010 0908   HDL 28.90* 04/18/2010 0908   CHOLHDL 3 04/18/2010 0908   VLDL 16.8 04/18/2010 0908   LDLCALC 31 04/18/2010 0908      Past Medical History  Diagnosis Date  . Myocardial infarct   . Coronary artery disease     s/p lateral MI 4 /11, s/p Promus DES OM2  . Hypertension   . Hyperlipidemia     No past surgical history on file.  Current Outpatient Prescriptions  Medication Sig Dispense Refill  . aspirin 81 MG tablet Take 81 mg by mouth daily.      . carvedilol (COREG) 25 MG tablet TAKE 1 TABLET (25MG  TOTAL) TWICE A DAY  60 tablet  0  . EFFIENT 10 MG TABS TAKE 1 TABLET DAILY  90 tablet  0  . [DISCONTINUED] carvedilol (COREG) 25 MG tablet TAKE 1 TABLET TWICE A DAY        No Known Allergies  History   Social History  . Marital Status: Married    Spouse Name: N/A    Number of Children: N/A  . Years of Education: N/A   Occupational History  . Company secretary    Social History Main Topics  . Smoking status: Not on file  . Smokeless tobacco: Not on file  . Alcohol Use: Not on file  . Drug  Use: Not on file  . Sexually Active: Not on file   Other Topics Concern  . Not on file   Social History Narrative   Occupation- Writer Former -tobacco -stopped ,3 children  Alcohol use - yes , social Drug use - noRegular exercise- yes     No family history on file.  Review of Systems:  As stated in the HPI and otherwise negative.   BP 188/101  Pulse 71  Resp 16  Ht 5\' 9"  (1.753 m)  Wt 204 lb 12.8 oz (92.897 kg)  BMI 30.24 kg/m2  Physical Examination: General: Well developed, well nourished, NAD HEENT: OP clear, mucus membranes moist SKIN: warm, dry. No rashes. Neuro: No focal deficits Musculoskeletal: Muscle strength 5/5 all ext Psychiatric: Mood and affect normal Neck: No JVD, no carotid bruits, no thyromegaly, no lymphadenopathy. Lungs:Clear bilaterally, no wheezes, rhonci, crackles Cardiovascular: Regular rate and rhythm. No murmurs, gallops or rubs. Abdomen:Soft. Bowel sounds present. Non-tender.  Extremities: No lower extremity edema. Pulses are 2 + in the bilateral DP/PT.  EKG: NSR, rate 65 bpm. T wave flattening anterolateral leads.   Cardiac Cath 10/02/2009   Left mainstem  is patent and there is no  significant stenosis present. The vessel divides into the LAD and left  circumflex.  LAD: The LAD is a large-caliber vessel that reaches the left  ventricular apex. It supplies a large first diagonal branch and small  second and third diagonals. The mid LAD has a 30-40% stenosis and there  is mild diffuse plaque with minor luminal irregularities further down in  the mid and distal vessel. There is TIMI 3 flow in the LAD.  Left circumflex: The left circumflex is dominant. The vessel has minor  irregularities in the proximal and midportions, but no areas of  significant stenosis. The first OM is small. The second OM is totally  occluded and this is the infarct artery. There is a left posterolateral  and left PDA branch present without  significant stenosis.  Right coronary artery: The RCA is a small, nondominant vessel. There  is an 80-90% stenosis in the midportion of the vessel.  Left ventriculography: The LVEF is 45%. There is inferoapical  hypokinesis. There is no significant mitral regurgitation.   Echo 2011:  Left ventricle: The cavity size was normal. Wall thickness was  increased in a pattern of mild LVH. Systolic function was mildly to  moderately reduced. The estimated ejection fraction was in the range  of 40% to 45%. There is severe hypokinesis of the basal-mid  inferoposterior myocardium. Doppler parameters are consistent with  abnormal left ventricular relaxation (grade 1 diastolic  dysfunction).    Assessment and Plan:   1. CAD: s/p lateral STEMI 2011 with placement of a drug eluting stent in the second OM. Doing well. Will continue ASA. Will stop Effient. Continue Coreg. Will restart Ace-inhibitor, statin.   2. HTN: Elevated off of Lisinopril. Will restart Lisinopril 10 mg po Qdaily.   3. HYPERLIPIDEMIA: No recent lipids. He stopped his statin. Will start atorvastatin 10 mg po QHS. Check lipids and LFTs in 12 weeks.

## 2012-05-26 NOTE — Patient Instructions (Addendum)
Your physician wants you to follow-up in:  12 months. You will receive a reminder letter in the mail two months in advance. If you don't receive a letter, please call our office to schedule the follow-up appointment.  Your physician has recommended you make the following change in your medication: Stop Effient.  Start atorvastatin 10 mg by mouth daily; Start Lisinopril 10 mg by mouth daily.  Your physician recommends that you return for fasting lab work in:  3 months.--week of August 10, 2012.  The lab opens at 8:30 daily.

## 2012-07-08 ENCOUNTER — Other Ambulatory Visit: Payer: Self-pay | Admitting: Cardiovascular Disease

## 2012-07-22 ENCOUNTER — Other Ambulatory Visit: Payer: Self-pay

## 2012-07-22 DIAGNOSIS — I2510 Atherosclerotic heart disease of native coronary artery without angina pectoris: Secondary | ICD-10-CM

## 2012-07-22 MED ORDER — ATORVASTATIN CALCIUM 10 MG PO TABS: 10.0000 mg | ORAL_TABLET | Freq: Every day | ORAL | Status: DC

## 2012-08-08 ENCOUNTER — Ambulatory Visit (INDEPENDENT_AMBULATORY_CARE_PROVIDER_SITE_OTHER): Payer: 59 | Admitting: Family Medicine

## 2012-08-08 VITALS — BP 198/98 | HR 72 | Temp 98.1°F | Resp 18 | Ht 68.5 in | Wt 203.0 lb

## 2012-08-08 DIAGNOSIS — R519 Headache, unspecified: Secondary | ICD-10-CM

## 2012-08-08 DIAGNOSIS — R51 Headache: Secondary | ICD-10-CM

## 2012-08-08 DIAGNOSIS — T2020XA Burn of second degree of head, face, and neck, unspecified site, initial encounter: Secondary | ICD-10-CM

## 2012-08-08 DIAGNOSIS — I10 Essential (primary) hypertension: Secondary | ICD-10-CM

## 2012-08-08 MED ORDER — HYDROCODONE-ACETAMINOPHEN 5-500 MG PO TABS: 1.0000 | ORAL_TABLET | ORAL | Status: DC | PRN
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
## 2012-08-08 NOTE — Patient Instructions (Addendum)
Change dressing tomorrow if needed, otherwise return Monday morning for me to recheck and change dressing as directed.  If you have problems tomorrow call PA Herbert Seta I have discussed the case with.  Ibuprofen for pain. If more severe pain use the hydrocodone.

## 2012-08-10 ENCOUNTER — Ambulatory Visit (INDEPENDENT_AMBULATORY_CARE_PROVIDER_SITE_OTHER): Payer: 59 | Admitting: Family Medicine

## 2012-08-10 VITALS — BP 158/90 | HR 63 | Temp 98.0°F | Resp 16 | Ht 70.0 in | Wt 203.0 lb

## 2012-08-10 DIAGNOSIS — Z5189 Encounter for other specified aftercare: Secondary | ICD-10-CM

## 2012-08-10 DIAGNOSIS — T300 Burn of unspecified body region, unspecified degree: Secondary | ICD-10-CM

## 2012-08-10 DIAGNOSIS — T2025XD Burn of second degree of scalp [any part], subsequent encounter: Secondary | ICD-10-CM

## 2012-08-10 DIAGNOSIS — Z23 Encounter for immunization: Secondary | ICD-10-CM
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
## 2012-08-10 NOTE — Patient Instructions (Addendum)
Continue dressing changes and silvadene to scalp each day.  Recheck in 4 days (08/14/12 after 2 pm with Dr. Alwyn Ren). Return to the clinic or go to the nearest emergency room if any of your symptoms worsen or new symptoms occur.

## 2012-08-11 ENCOUNTER — Other Ambulatory Visit (INDEPENDENT_AMBULATORY_CARE_PROVIDER_SITE_OTHER): Payer: 59

## 2012-08-11 DIAGNOSIS — I2510 Atherosclerotic heart disease of native coronary artery without angina pectoris: Secondary | ICD-10-CM

## 2012-08-11 LAB — HEPATIC FUNCTION PANEL
ALT: 19 U/L (ref 0–53)
AST: 13 U/L (ref 0–37)
Total Bilirubin: 1 mg/dL (ref 0.3–1.2)
Total Protein: 6.9 g/dL (ref 6.0–8.3)

## 2012-08-11 LAB — LDL CHOLESTEROL, DIRECT: Direct LDL: 74.6 mg/dL

## 2012-08-19 ENCOUNTER — Telehealth: Payer: Self-pay | Admitting: *Deleted

## 2012-08-19 DIAGNOSIS — I10 Essential (primary) hypertension: Secondary | ICD-10-CM

## 2012-08-19 NOTE — Telephone Encounter (Signed)
Spoke with pt who reports elevated blood pressure. He checks blood pressure several times per week and usual range is 159-162/98-99. This AM was 168/99.  This has been going on since last office visit in November. He is taking Coreg and Lisinopril as listed on med list. States he is feeling fine and has no other complaints. Will send to Dr. Clifton James for review

## 2012-08-20 MED ORDER — CARVEDILOL 25 MG PO TABS: 25.0000 mg | ORAL_TABLET | Freq: Two times a day (BID) | ORAL | Status: DC

## 2012-08-20 MED ORDER — LISINOPRIL 20 MG PO TABS: 20.0000 mg | ORAL_TABLET | Freq: Every day | ORAL | Status: DC

## 2012-08-20 NOTE — Telephone Encounter (Signed)
Spoke with pt and gave him instructions from Dr. Clifton James. He will call us if blood pressure remains elevated after increasing Lisinopril. Will send to [facility] on [address].

## 2012-08-20 NOTE — Telephone Encounter (Signed)
We can increase his Lisinopril to 20 mg po Qdaily.

## 2013-02-15 ENCOUNTER — Emergency Department (INDEPENDENT_AMBULATORY_CARE_PROVIDER_SITE_OTHER): Payer: 59

## 2013-02-15 ENCOUNTER — Emergency Department (HOSPITAL_COMMUNITY)
Admission: EM | Admit: 2013-02-15 | Discharge: 2013-02-16 | Disposition: A | Discharge status: Home / Self Care | Payer: 59 | Attending: Emergency Medicine | Admitting: Emergency Medicine

## 2013-02-15 ENCOUNTER — Encounter (HOSPITAL_COMMUNITY): Payer: Self-pay | Admitting: Emergency Medicine

## 2013-02-15 ENCOUNTER — Emergency Department (INDEPENDENT_AMBULATORY_CARE_PROVIDER_SITE_OTHER)
Admission: EM | Admit: 2013-02-15 | Discharge: 2013-02-15 | Disposition: A | Discharge status: Hospice | Payer: 59 | Source: Home / Self Care | Attending: Emergency Medicine | Admitting: Emergency Medicine

## 2013-02-15 DIAGNOSIS — Z7982 Long term (current) use of aspirin: Secondary | ICD-10-CM | POA: Insufficient documentation

## 2013-02-15 DIAGNOSIS — R51 Headache: Secondary | ICD-10-CM | POA: Insufficient documentation

## 2013-02-15 DIAGNOSIS — J069 Acute upper respiratory infection, unspecified: Secondary | ICD-10-CM | POA: Insufficient documentation

## 2013-02-15 DIAGNOSIS — R0602 Shortness of breath: Secondary | ICD-10-CM | POA: Insufficient documentation

## 2013-02-15 DIAGNOSIS — E7800 Pure hypercholesterolemia, unspecified: Secondary | ICD-10-CM | POA: Insufficient documentation

## 2013-02-15 DIAGNOSIS — R9389 Abnormal findings on diagnostic imaging of other specified body structures: Secondary | ICD-10-CM

## 2013-02-15 DIAGNOSIS — R918 Other nonspecific abnormal finding of lung field: Secondary | ICD-10-CM

## 2013-02-15 DIAGNOSIS — I2510 Atherosclerotic heart disease of native coronary artery without angina pectoris: Secondary | ICD-10-CM | POA: Insufficient documentation

## 2013-02-15 DIAGNOSIS — R509 Fever, unspecified: Secondary | ICD-10-CM | POA: Insufficient documentation

## 2013-02-15 DIAGNOSIS — Z9861 Coronary angioplasty status: Secondary | ICD-10-CM | POA: Insufficient documentation

## 2013-02-15 DIAGNOSIS — R05 Cough: Secondary | ICD-10-CM | POA: Insufficient documentation

## 2013-02-15 DIAGNOSIS — J3489 Other specified disorders of nose and nasal sinuses: Secondary | ICD-10-CM | POA: Insufficient documentation

## 2013-02-15 DIAGNOSIS — I10 Essential (primary) hypertension: Secondary | ICD-10-CM | POA: Insufficient documentation

## 2013-02-15 DIAGNOSIS — R059 Cough, unspecified: Secondary | ICD-10-CM | POA: Insufficient documentation

## 2013-02-15 DIAGNOSIS — R0902 Hypoxemia: Secondary | ICD-10-CM

## 2013-02-15 DIAGNOSIS — I252 Old myocardial infarction: Secondary | ICD-10-CM | POA: Insufficient documentation

## 2013-02-15 DIAGNOSIS — J029 Acute pharyngitis, unspecified: Secondary | ICD-10-CM | POA: Insufficient documentation

## 2013-02-15 DIAGNOSIS — Z87891 Personal history of nicotine dependence: Secondary | ICD-10-CM | POA: Insufficient documentation

## 2013-02-15 LAB — BASIC METABOLIC PANEL
BUN: 17 mg/dL (ref 6–23)
CO2: 22 mEq/L (ref 19–32)
Chloride: 101 mEq/L (ref 96–112)
Creatinine, Ser: 1.03 mg/dL (ref 0.50–1.35)
GFR calc Af Amer: 89 mL/min — ABNORMAL LOW (ref >90)

## 2013-02-15 LAB — CBC
HCT: 42.5 % (ref 39.0–52.0)
MCV: 81.1 fL (ref 78.0–100.0)
RDW: 13.2 % (ref 11.5–15.5)
WBC: 12.5 10*3/uL — ABNORMAL HIGH (ref 4.0–10.5)

## 2013-02-15 NOTE — ED Notes (Signed)
PT. REPORTS MID CHEST HEAVINESS WITH CHEST CONGESTION / PRODUCTIVE COUGH ONSET YESTERDAY , SLIGHT SOB , NO NAUSEA OR DIAPHORESIS. CHEST X-RAY DONE AT URGENT CARE THIS EVENING . PT. STATED HISTORY OF CAD / CORONARY STENT BY [place] CARDIOLOGIST.

## 2013-02-15 NOTE — ED Notes (Addendum)
Pt seen at [facility] PTA, sent here d/t CXR, c/o cough, congestion (nasal and chest), cold sx, fever, some mild sob, light headed & chest pressure, voice is hoarse, (denies: nvd, dizziness, palpitations or chest pain), sx onset Saturday, pt has strong cardiac hx, pt "thinks sx are respiritory related and not heart related", no dyspnea noted, congested cough present, no pedal edema present. Reports "had fever PTA". CXR PTA reviewed. Labs resulted & reviewed.

## 2013-02-15 NOTE — ED Notes (Signed)
Dr.Wofford EDP at [facility]

## 2013-02-15 NOTE — ED Provider Notes (Signed)
CSN: [id number]     Arrival date & time 02/15/13  2119 History     First MD Initiated Contact with Patient 02/15/13 2303     Chief Complaint  Patient presents with  . Chest Pain   (Consider location/radiation/quality/duration/timing/severity/associated sxs/prior Treatment) Patient is a 62 y.o. male presenting with URI.  URI Presenting symptoms: congestion, cough, fever and sore throat   Severity:  Severe Onset quality:  Gradual Duration:  2 days Timing:  Constant Progression:  Worsening Chronicity:  New Relieved by:  Nothing Ineffective treatments:  None tried Associated symptoms: headaches   Associated symptoms: no sinus pain     Past Medical History  Diagnosis Date  . Myocardial infarct   . Coronary artery disease     s/p lateral MI 4 /11, s/p Promus DES OM2  . Hypertension   . Hyperlipidemia   . Allergy    Past Surgical History  Procedure Laterality Date  . Coronary stent placement    . Tonsillectomy    . Adenoidectomy    . Vasectomy     No family history on file. History  Substance Use Topics  . Smoking status: Former Games developer  . Smokeless tobacco: Never Used  . Alcohol Use: Yes    Review of Systems  Constitutional: Positive for fever.  HENT: Positive for congestion and sore throat.   Respiratory: Positive for cough and shortness of breath.   Cardiovascular: Negative for chest pain.  Gastrointestinal: Negative for nausea, vomiting, abdominal pain and diarrhea.  Neurological: Positive for headaches.  All other systems reviewed and are negative.    Allergies  Review of patient's allergies indicates no known allergies.  Home Medications   Current Outpatient Rx  Name  Route  Sig  Dispense  Refill  . aspirin 81 MG tablet   Oral   Take 81 mg by mouth daily.         Marland Kitchen atorvastatin (LIPITOR) 10 MG tablet   Oral   Take 1 tablet (10 mg total) by mouth daily.   90 tablet   3   . carvedilol (COREG) 25 MG tablet   Oral   Take 1 tablet (25 mg  total) by mouth 2 (two) times daily with a meal.   180 tablet   3   . lisinopril (PRINIVIL,ZESTRIL) 20 MG tablet   Oral   Take 1 tablet (20 mg total) by mouth daily.   90 tablet   3    BP 139/79  Pulse 81  Temp(Src) 98.7 F (37.1 C) (Oral)  Resp 22  Ht 5\' 9"  (1.753 m)  Wt 190 lb (86.183 kg)  BMI 28.05 kg/m2  SpO2 95% Physical Exam  Nursing note and vitals reviewed. Constitutional: He is oriented to person, place, and time. He appears well-developed and well-nourished. No distress.  HENT:  Head: Normocephalic and atraumatic.  Nose: Right sinus exhibits no maxillary sinus tenderness and no frontal sinus tenderness. Left sinus exhibits no maxillary sinus tenderness and no frontal sinus tenderness.  Mouth/Throat: Oropharynx is clear and moist.  congestion  Eyes: Conjunctivae are normal. Pupils are equal, round, and reactive to light. No scleral icterus.  Neck: Neck supple.  Cardiovascular: Normal rate, regular rhythm, normal heart sounds and intact distal pulses.   No murmur heard. Pulmonary/Chest: Effort normal and breath sounds normal. No stridor. No respiratory distress. He has no wheezes. He has no rales.  Abdominal: Soft. He exhibits no distension. There is no tenderness.  Musculoskeletal: Normal range of motion. He exhibits no edema  and no tenderness.  Neurological: He is alert and oriented to person, place, and time.  Skin: Skin is warm and dry. No rash noted.  Psychiatric: He has a normal mood and affect. His behavior is normal.    ED Course   Procedures (including critical care time)  Labs Reviewed  CBC - Abnormal; Notable for the following:    WBC 12.5 (*)    Platelets 138 (*)    All other components within normal limits  BASIC METABOLIC PANEL - Abnormal; Notable for the following:    Glucose, Bld 215 (*)    GFR calc non Af Amer 77 (*)    GFR calc Af Amer 89 (*)    All other components within normal limits  PRO B NATRIURETIC PEPTIDE - Abnormal; Notable for  the following:    Pro B Natriuretic peptide (BNP) 470.4 (*)    All other components within normal limits  POCT I-STAT TROPONIN I   Dg Chest 2 View  02/15/2013   *RADIOLOGY REPORT*  Clinical Data: Shortness of breath.  Nasal congestion.  CHEST - 2 VIEW  Comparison: 10/02/2009  Findings: There is a focal area of atelectasis at the left base posteriorly.  There is also peribronchial thickening consistent bronchitis.  No consolidative infiltrate or effusion.  Heart size and vascularity are normal.  No acute osseous abnormality.  IMPRESSION: Bronchitic changes with focal atelectasis at the left base posteriorly.   Original Report Authenticated By: Francene Boyers, M.D.  All radiology studies independently viewed by me.      1. Acute URI     MDM  62 yo male with 2 days of subjective fevers, chills, URI symptoms, and cough.  Low grade temp here.  Sent from urgent care secondary to atelectasis seen on CXR and ?Hypoxia.  I see no documented hypoxia in urgent care's notes and patient is not hypoxic or in any respiratory distress here.  Denies chest pain.  Does endorse shortness of breath earlier.  Lungs clear.  Labs drawn per protocol show mild leukocytosis.  Otherwise nonspecific.  Suspect viral URi, but will ambulate to check pulse ox.    1:18 AM Pt has remained well appearing.  One documented pulse ox of 89% appears to be a false reading, as he has been consistently in the mid 90s, reports no SOB, is in no distress, and ambulated without becoming hypoxic.  Advised symptomatic care for his URI and advised close PCP follow up.  There does not appear to be an indication for abx use at this time.  Return precautions given.    Candyce Churn, MD 02/16/13 [phone]

## 2013-02-15 NOTE — ED Provider Notes (Signed)
CSN: [id number]     Arrival date & time 02/15/13  1916 History     First MD Initiated Contact with Patient 02/15/13 1959     Chief Complaint  Patient presents with  . Shortness of Breath  . Nasal Congestion   (Consider location/radiation/quality/duration/timing/severity/associated sxs/prior Treatment) HPI Comments: 62 year old male with a history of type 2 diabetes, MI/CAD with ischemic cardiomyopathy, hypertension, hyperlipidemia presents complaining of sore throat, nasal congestion, chest congestion, cough, shortness of breath on exertion. This began 3 days ago with a sore throat. The next day, he started to have a lot of pressure in his sinuses. Now, the sinuses are started to clear up but he is still having the sore throat. He feels like this is from the sinuses down into his chest. His cough is mostly nonproductive. He denies feeling short of breath at rest but he does get short of breath very easily with walking. He denies pleuritic chest pain or any chest pain. He does admit to an episode of shaking chills earlier today as well as some mild nausea. He also has been feeling lightheaded today. He has tried taking over-the-counter medications for this without relief. He denies measured fever at home, leg swelling, or recent travel.  Patient is a 62 y.o. male presenting with shortness of breath.  Shortness of Breath Associated symptoms: cough and sore throat   Associated symptoms: no abdominal pain, no chest pain, no fever, no neck pain, no rash and no vomiting     Past Medical History  Diagnosis Date  . Myocardial infarct   . Coronary artery disease     s/p lateral MI 4 /11, s/p Promus DES OM2  . Hypertension   . Hyperlipidemia   . Allergy    History reviewed. No pertinent past surgical history. History reviewed. No pertinent family history. History  Substance Use Topics  . Smoking status: Former Games developer  . Smokeless tobacco: Never Used  . Alcohol Use: Not on file    Review  of Systems  Constitutional: Positive for chills and fatigue. Negative for fever.  HENT: Positive for congestion, sore throat and sinus pressure. Negative for neck pain and neck stiffness.   Eyes: Negative for visual disturbance.  Respiratory: Positive for cough, chest tightness and shortness of breath.   Cardiovascular: Negative for chest pain, palpitations and leg swelling.  Gastrointestinal: Positive for nausea. Negative for vomiting, abdominal pain, diarrhea and constipation.  Genitourinary: Negative for dysuria, urgency, frequency and hematuria.  Musculoskeletal: Negative for myalgias and arthralgias.  Skin: Negative for rash.  Neurological: Positive for light-headedness. Negative for dizziness and weakness.    Allergies  Review of patient's allergies indicates no known allergies.  Home Medications   Current Outpatient Rx  Name  Route  Sig  Dispense  Refill  . aspirin 81 MG tablet   Oral   Take 81 mg by mouth daily.         Marland Kitchen atorvastatin (LIPITOR) 10 MG tablet   Oral   Take 1 tablet (10 mg total) by mouth daily.   90 tablet   3   . carvedilol (COREG) 25 MG tablet   Oral   Take 1 tablet (25 mg total) by mouth 2 (two) times daily with a meal.   180 tablet   3   . lisinopril (PRINIVIL,ZESTRIL) 20 MG tablet   Oral   Take 1 tablet (20 mg total) by mouth daily.   90 tablet   3   . HYDROcodone-acetaminophen (VICODIN) 5-500 MG per  tablet   Oral   Take 1 tablet by mouth every 4 (four) hours as needed for pain.   12 tablet   0    BP 151/81  Pulse 101  Temp(Src) 100.5 F (38.1 C) (Oral)  Resp 20  SpO2 94% Physical Exam  Nursing note and vitals reviewed. Constitutional: He is oriented to person, place, and time. He appears well-developed and well-nourished. No distress.  HENT:  Head: Normocephalic and atraumatic.  Mouth/Throat: Oropharynx is clear and moist. No oropharyngeal exudate.  Neck: Normal range of motion. Neck supple.  Cardiovascular: Normal rate and  regular rhythm.  Exam reveals no gallop and no friction rub.   No murmur heard. Pulmonary/Chest: Effort normal. He has rales (Right upper lobe).  Lymphadenopathy:    He has cervical adenopathy (tonsillar).  Neurological: He is oriented to person, place, and time. Coordination normal.  Skin: Skin is warm and dry. No rash noted. He is not diaphoretic.  Psychiatric: He has a normal mood and affect. Judgment normal.    ED Course   Procedures (including critical care time)  Labs Reviewed - No data to display Dg Chest 2 View  02/15/2013   *RADIOLOGY REPORT*  Clinical Data: Shortness of breath.  Nasal congestion.  CHEST - 2 VIEW  Comparison: 10/02/2009  Findings: There is a focal area of atelectasis at the left base posteriorly.  There is also peribronchial thickening consistent bronchitis.  No consolidative infiltrate or effusion.  Heart size and vascularity are normal.  No acute osseous abnormality.  IMPRESSION: Bronchitic changes with focal atelectasis at the left base posteriorly.   Original Report Authenticated By: Francene Boyers, M.D.   1. Hypoxemia   2. URI (upper respiratory infection)   3. Abnormal chest x-ray     MDM  62 year old male with shortness of breath, chest congestion, sore throat , low oxygen saturation, abnormal chest x-ray. Recent history of MI. Transferred to the emergency department for further workup.  Graylon Good, PA-C 02/15/13 2051

## 2013-02-15 NOTE — ED Provider Notes (Signed)
Medical screening examination/treatment/procedure(s) were performed by non-physician practitioner and as supervising physician I was immediately available for consultation/collaboration.  Leslee Home, M.D.  Reuben Likes, MD 02/15/13 2139

## 2013-02-15 NOTE — ED Notes (Signed)
C/o sob and chest congestion which started Saturday evening.  Patient states it started as a sore throat.  Patient states when he coughs throat hurts.  No appetite is low.  allerclear taken but no relief.  Cough is a little productive with green mucus.

## 2013-02-16 NOTE — ED Notes (Signed)
Alert, NAD, calm, interactive, denies sx or questions, "feels better, OK and fine", VSS, wife at [facility]

## 2013-02-16 NOTE — ED Notes (Signed)
Dr. Loretha Stapler at [facility]

## 2013-05-15 ENCOUNTER — Other Ambulatory Visit: Payer: Self-pay | Admitting: Cardiovascular Disease

## 2013-07-20 ENCOUNTER — Ambulatory Visit: Payer: 59 | Admitting: Family Medicine

## 2013-07-20 ENCOUNTER — Ambulatory Visit: Payer: 59

## 2013-07-20 VITALS — BP 120/60 | HR 86 | Temp 99.1°F | Resp 16 | Ht 69.0 in | Wt 192.0 lb

## 2013-07-20 DIAGNOSIS — J209 Acute bronchitis, unspecified: Secondary | ICD-10-CM

## 2013-07-20 DIAGNOSIS — R05 Cough: Secondary | ICD-10-CM

## 2013-07-20 DIAGNOSIS — R059 Cough, unspecified: Secondary | ICD-10-CM

## 2013-07-20 LAB — POCT CBC
GRANULOCYTE PERCENT: 75 % (ref 37–80)
HEMATOCRIT: 43.1 % — AB (ref 43.5–53.7)
HEMOGLOBIN: 13.7 g/dL — AB (ref 14.1–18.1)
Lymph, poc: 2.2 (ref 0.6–3.4)
MCH, POC: 28 pg (ref 27–31.2)
MCHC: 31.8 g/dL (ref 31.8–35.4)
MCV: 88 fL (ref 80–97)
MID (cbc): 0.9 (ref 0–0.9)
MPV: 9.2 fL (ref 0–99.8)
POC GRANULOCYTE: 9.2 — AB (ref 2–6.9)
POC LYMPH %: 17.9 % (ref 10–50)
POC MID %: 7.1 %M (ref 0–12)
Platelet Count, POC: 255 10*3/uL (ref 142–424)
RBC: 4.9 M/uL (ref 4.69–6.13)
RDW, POC: 13.8 %
WBC: 12.3 10*3/uL — AB (ref 4.6–10.2)

## 2013-07-20 MED ORDER — AZITHROMYCIN 250 MG PO TABS: ORAL_TABLET | ORAL | Status: DC

## 2013-07-20 MED ORDER — HYDROCOD POLST-CHLORPHEN POLST 10-8 MG/5ML PO LQCR: 5.0000 mL | Freq: Two times a day (BID) | ORAL | Status: DC | PRN

## 2013-07-20 MED ORDER — IPRATROPIUM BROMIDE 0.03 % NA SOLN: 2.0000 | Freq: Four times a day (QID) | NASAL | Status: DC

## 2013-07-20 NOTE — Progress Notes (Signed)
Subjective:    Patient ID: Scott Dodson, male    DOB: [birth year], 63 y.o.   MRN: [id number] Chief Complaint  Patient presents with  . Cough  . Headache  . chest congestion    HPI  Started feeling ill 3 wks prior - keeps thinking it was going away but still a little sluggish, then coughed all night and can hardly move today.  Started w/ a head cold then moved into throat and then chest after 3-4d so now has had a coughing illness for 2 1/2 wks. Has tried mucinex, robitussion, nyquil, dayquil and a few other things otc none of which provided any relief.  Has had subj f/c.  Now is having left shoulder pain which has persisted all day, rib pain from all the coughing.  Cough has been productive and varied in sputum - white, green, gray. Has had episodes of rigors and then has had some episodes of SHoB and wheezing - definitely worse at night and when laying down.  Past Medical History  Diagnosis Date  . Myocardial infarct   . Coronary artery disease     s/p lateral MI 4 /11, s/p Promus DES OM2  . Hypertension   . Hyperlipidemia   . Allergy   . Asthma   . Heart murmur    Current Outpatient Prescriptions on File Prior to Visit  Medication Sig Dispense Refill  . aspirin 81 MG tablet Take 81 mg by mouth daily.      Marland Kitchen atorvastatin (LIPITOR) 10 MG tablet Take 1 tablet (10 mg total) by mouth daily.  90 tablet  3  . carvedilol (COREG) 25 MG tablet Take 1 tablet (25 mg total) by mouth 2 (two) times daily with a meal.  180 tablet  3  . lisinopril (PRINIVIL,ZESTRIL) 20 MG tablet Take 1 tablet (20 mg total) by mouth daily.  90 tablet  3   No current facility-administered medications on file prior to visit.   No Known Allergies  Review of Systems  Constitutional: Positive for fever, chills, diaphoresis, activity change and fatigue. Negative for appetite change and unexpected weight change.  HENT: Positive for congestion, postnasal drip, rhinorrhea and sore throat. Negative for ear pain, mouth  sores and sinus pressure.   Respiratory: Positive for cough, shortness of breath and wheezing.   Cardiovascular: Positive for chest pain.  Gastrointestinal: Negative for nausea, vomiting, abdominal pain, diarrhea and constipation.  Genitourinary: Negative for dysuria.  Musculoskeletal: Positive for arthralgias, myalgias and neck pain.  Skin: Negative for rash.  Neurological: Positive for headaches. Negative for syncope.  Hematological: Negative for adenopathy.  Psychiatric/Behavioral: Positive for sleep disturbance.      BP 120/60  Pulse 86  Temp(Src) 99.1 F (37.3 C) (Oral)  Resp 16  Ht 5\' 9"  (1.753 m)  Wt 192 lb (87.091 kg)  BMI 28.34 kg/m2  SpO2 95% Objective:   Physical Exam  Constitutional: He is oriented to person, place, and time. He appears well-developed and well-nourished. No distress.  HENT:  Head: Normocephalic and atraumatic.  Right Ear: External ear and ear canal normal. A middle ear effusion is present.  Left Ear: External ear and ear canal normal. A middle ear effusion is present.  Nose: Mucosal edema and rhinorrhea present.  Mouth/Throat: Uvula is midline and mucous membranes are normal. Posterior oropharyngeal erythema present. No oropharyngeal exudate or posterior oropharyngeal edema.  Eyes: Conjunctivae are normal. Right eye exhibits no discharge. Left eye exhibits no discharge. No scleral icterus.  Neck: Normal range of  motion. Neck supple. No thyromegaly present.  Cardiovascular: Normal rate, regular rhythm, normal heart sounds and intact distal pulses.   Pulmonary/Chest: Effort normal. No respiratory distress. He has decreased breath sounds in the right lower field and the left lower field.  Lymphadenopathy:       Head (right side): No submandibular adenopathy present.       Head (left side): No submandibular adenopathy present.    He has no cervical adenopathy.       Right: No supraclavicular adenopathy present.       Left: No supraclavicular adenopathy  present.  Neurological: He is alert and oriented to person, place, and time.  Skin: Skin is warm. He is diaphoretic. No erythema.  Psychiatric: He has a normal mood and affect. His behavior is normal.   Results for orders placed in visit on 07/20/13  POCT CBC      Result Value Range   WBC 12.3 (*) 4.6 - 10.2 K/uL   Lymph, poc 2.2  0.6 - 3.4   POC LYMPH PERCENT 17.9  10 - 50 %L   MID (cbc) 0.9  0 - 0.9   POC MID % 7.1  0 - 12 %M   POC Granulocyte 9.2 (*) 2 - 6.9   Granulocyte percent 75.0  37 - 80 %G   RBC 4.90  4.69 - 6.13 M/uL   Hemoglobin 13.7 (*) 14.1 - 18.1 g/dL   HCT, POC 43.1 (*) 43.5 - 53.7 %   MCV 88.0  80 - 97 fL   MCH, POC 28.0  27 - 31.2 pg   MCHC 31.8  31.8 - 35.4 g/dL   RDW, POC 13.8     Platelet Count, POC 255  142 - 424 K/uL   MPV 9.2  0 - 99.8 fL      [facility] reading (PRIMARY) by  Dr. Brigitte Pulse. CXR: increased interstitial markers, increased right lower lobe infiltrate Assessment & Plan:   Cough - Plan: POCT CBC, DG Chest 2 View  Acute bronchitis  Meds ordered this encounter  Medications  . azithromycin (ZITHROMAX) 250 MG tablet    Sig: Take 2 tabs PO x 1 dose, then 1 tab PO QD x 4 days    Dispense:  6 tablet    Refill:  0  . chlorpheniramine-HYDROcodone (TUSSIONEX PENNKINETIC ER) 10-8 MG/5ML LQCR    Sig: Take 5 mLs by mouth every 12 (twelve) hours as needed.    Dispense:  120 mL    Refill:  0  . ipratropium (ATROVENT) 0.03 % nasal spray    Sig: Place 2 sprays into the nose 4 (four) times daily.    Dispense:  30 mL    Refill:  1   Delman Cheadle, MD MPH

## 2013-07-20 NOTE — Patient Instructions (Signed)
Hot showers or breathing in steam may help loosen the congestion.  Using a netti pot or sinus rinse is also likely to help you feel better and keep this from progressing.  Use the atrovent nasal spray as needed throughout the day and use the fluticasone nasal spray every night before bed for at least 2 weeks.  I recommend augmenting with generic mucinex to help you move out the congestion.  If no improvement or you are getting worse, come back as you might need a course of steroids but hopefully with all of the above, you can avoid it.  You should stay away from all cough and cold medications containing decongestant, especially phenylephrine and pseudoephedrine (will be listed under the active ingredient list).  To make it easier, CoricidinHBP is a product tailored towards people with hypertension.    Acute Bronchitis Bronchitis is inflammation of the airways that extend from the windpipe into the lungs (bronchi). The inflammation often causes mucus to develop. This leads to a cough, which is the most common symptom of bronchitis.  In acute bronchitis, the condition usually develops suddenly and goes away over time, usually in a couple weeks. Smoking, allergies, and asthma can make bronchitis worse. Repeated episodes of bronchitis may cause further lung problems.  CAUSES Acute bronchitis is most often caused by the same virus that causes a cold. The virus can spread from person to person (contagious).  SIGNS AND SYMPTOMS   Cough.   Fever.   Coughing up mucus.   Body aches.   Chest congestion.   Chills.   Shortness of breath.   Sore throat.  DIAGNOSIS  Acute bronchitis is usually diagnosed through a physical exam. Tests, such as chest X-rays, are sometimes done to rule out other conditions.  TREATMENT  Acute bronchitis usually goes away in a couple weeks. Often times, no medical treatment is necessary. Medicines are sometimes given for relief of fever or cough. Antibiotics are  usually not needed but may be prescribed in certain situations. In some cases, an inhaler may be recommended to help reduce shortness of breath and control the cough. A cool mist vaporizer may also be used to help thin bronchial secretions and make it easier to clear the chest.  HOME CARE INSTRUCTIONS  Get plenty of rest.   Drink enough fluids to keep your urine clear or pale yellow (unless you have a medical condition that requires fluid restriction). Increasing fluids may help thin your secretions and will prevent dehydration.   Only take over-the-counter or prescription medicines as directed by your health care provider.   Avoid smoking and secondhand smoke. Exposure to cigarette smoke or irritating chemicals will make bronchitis worse. If you are a smoker, consider using nicotine gum or skin patches to help control withdrawal symptoms. Quitting smoking will help your lungs heal faster.   Reduce the chances of another bout of acute bronchitis by washing your hands frequently, avoiding people with cold symptoms, and trying not to touch your hands to your mouth, nose, or eyes.   Follow up with your health care provider as directed.  SEEK MEDICAL CARE IF: Your symptoms do not improve after 1 week of treatment.  SEEK IMMEDIATE MEDICAL CARE IF:  You develop an increased fever or chills.   You have chest pain.   You have severe shortness of breath.  You have bloody sputum.   You develop dehydration.  You develop fainting.  You develop repeated vomiting.  You develop a severe headache. MAKE SURE YOU:  Understand these instructions.  Will watch your condition.  Will get help right away if you are not doing well or get worse. Document Released: 07/25/2004 Document Revised: 02/17/2013 Document Reviewed: 12/08/2012 [facility] Patient Information 2014 [place].

## 2013-07-25 ENCOUNTER — Ambulatory Visit: Payer: 59 | Admitting: Family Medicine

## 2013-07-25 ENCOUNTER — Ambulatory Visit: Payer: 59

## 2013-07-25 VITALS — BP 158/92 | HR 77 | Temp 98.4°F | Resp 16 | Ht 69.0 in | Wt 190.4 lb

## 2013-07-25 DIAGNOSIS — J189 Pneumonia, unspecified organism: Secondary | ICD-10-CM

## 2013-07-25 DIAGNOSIS — R0789 Other chest pain: Secondary | ICD-10-CM

## 2013-07-25 DIAGNOSIS — J90 Pleural effusion, not elsewhere classified: Secondary | ICD-10-CM

## 2013-07-25 DIAGNOSIS — R509 Fever, unspecified: Secondary | ICD-10-CM

## 2013-07-25 DIAGNOSIS — R071 Chest pain on breathing: Secondary | ICD-10-CM

## 2013-07-25 LAB — POCT CBC
Granulocyte percent: 64.5 %G (ref 37–80)
HCT, POC: 43 % — AB (ref 43.5–53.7)
HEMOGLOBIN: 13.2 g/dL — AB (ref 14.1–18.1)
LYMPH, POC: 1 (ref 0.6–3.4)
MCH: 27 pg (ref 27–31.2)
MCHC: 30.7 g/dL — AB (ref 31.8–35.4)
MCV: 88 fL (ref 80–97)
MID (CBC): 0.7 (ref 0–0.9)
MPV: 9.4 fL (ref 0–99.8)
POC GRANULOCYTE: 3 (ref 2–6.9)
POC LYMPH %: 21.1 % (ref 10–50)
POC MID %: 14.4 % — AB (ref 0–12)
Platelet Count, POC: 279 10*3/uL (ref 142–424)
RBC: 4.89 M/uL (ref 4.69–6.13)
RDW, POC: 13.5 %
WBC: 4.6 10*3/uL (ref 4.6–10.2)

## 2013-07-25 MED ORDER — HYDROCODONE-ACETAMINOPHEN 5-325 MG PO TABS: 1.0000 | ORAL_TABLET | Freq: Four times a day (QID) | ORAL | Status: DC | PRN

## 2013-07-25 NOTE — Patient Instructions (Signed)
Your blood count is improved and chest xray also looks to be improved today, but not back to normal. I don't see a rib fracture, so you may have pulled a muscle with coughing. It is okay to use Ibuprofen or Aleve over the counter as needed in addition to the other pain medication as discussed, but this should not be taken with cough syrup. Use one or the other. Recheck with myself or Dr. Brigitte Pulse in the next 3-4 days. Return to the clinic or go to the nearest emergency room if any of your symptoms worsen or new symptoms occur.    Pneumonia, Adult Pneumonia is an infection of the lungs. Pleural Effusion The lining covering your lungs and the inside of your chest is called the pleura. Usually, the space between the 2 pleura contains no air and only a thin layer of fluid. A pleural effusion is an abnormal buildup of fluid in the pleural space. Fluid gathers when there is increased pressure in the lung vessels. This forces fluids out of the lungs and into the pleural space. Vessels may also leak fluids when there are infections, such as pneumonia, or other causes of soreness and redness (inflammation). Fluids leak into the lungs when protein in the blood is low or when certain vessels (lymphatics) are blocked. Finding a pleural effusion is important because it is usually caused by another disease. In order to treat a pleural effusion, your caregiver needs to find its cause. If left untreated, a large amount of fluid can build up and cause collapse of the lung. CAUSES   Heart failure.  Infections (pneumonia, tuberculosis), pulmonary embolism, pulmonary infarction.  Cancer (primary lung and metastatic), asbestosis.  Liver failure (cirrhosis).  Nephrotic syndrome, peritoneal dialysis, kidney problems (uremia).  Collagen vascular disease (systemic lupus erythematosis, rheumatoid arthritis).  Injury (trauma) to the chest or rupture of the digestive tube (esophagus).  Material in the chest or pleural  space (hemothorax, chylothorax).  Pancreatitis.  Surgery.  Drug reactions. SYMPTOMS  A pleural effusion can decrease the amount of space available for breathing and make you short of breath. The fluid can become infected, which may cause pain and fever. Often, the pain is worse when taking a deep breath. The underlying disease (heart failure, pneumonia, blood clot, tuberculosis, cancer) may also cause symptoms. DIAGNOSIS   Your caregiver can usually tell what is wrong by talking to you (taking a history), doing an exam, and taking a routine X-ray. If the X-ray shows fluid in your chest, often fluid is removed from your chest with a needle for testing (diagnostic thoracentesis).  Sometimes, more specialized X-rays may be needed.  Sometimes, a small piece of tissue is removed and examined by a specialist (biopsy). TREATMENT  Treatment varies based on what caused the pleural effusion. Treatments include:  Removing as much fluid as possible using a needle (thoracentesis) to improve the cough and shortness of breath. This is a simple procedure which can be done at bedside. The risks are bleeding, infection, collapse of a lung, or low blood pressure.  Placing a tube in the chest to drain the effusion (tube thoracostomy). This is often used when there is an infection in the fluid. This is a simple procedure which can often be done at bedside or in a clinic. The procedure may be painful. The risks are the same as using a needle to drain the fluid. The chest tube usually remains for a few days and is connected to suction to improve fluid drainage. The  tube, after placement, usually does not cause much discomfort.  Surgical removal of fibrous debris in and around the pleural space (decortication). This may be done with a flexible telescope (thoracoscope) through a small or large cut (incision). This is helpful for patients who have fibrosis or scar tissue that prevents complete lung expansion. The risks  are infection, blood loss, and side effects from general anesthesia.  Sometimes, a procedure called pleurodesis is done. A chest tube is placed and the fluid is drained. Next, an agent (tetracycline, talc powder) is added to the pleural space. This causes the lung and chest wall to stick together (adhesion). This leaves no potential space for fluid to build up. The risks include infection, blood loss, and side effects from general anesthesia.  If the effusion is caused by infection, it may be treated with antibiotics and improve without draining. HOME CARE INSTRUCTIONS   Take any medicines exactly as prescribed.  Follow up with your caregiver as directed.  Monitor your exercise capacity (the amount of walking you can do before you get short of breath).  Do not smoke. Ask your caregiver for help quitting. SEEK MEDICAL CARE IF:   Your exercise capacity seems to get worse or does not improve with time.  You do not recover from your illness. SEEK IMMEDIATE MEDICAL CARE IF:   Shortness of breath or chest pain develops or gets worse.  You have an oral temperature above 102 F (38.9 C), not controlled by medicine.  You develop a new cough, especially if the mucus (phlegm) is discolored. MAKE SURE YOU:   Understand these instructions.  Will watch your condition.  Will get help right away if you are not doing well or get worse. Document Released: 06/17/2005 Document Revised: 04/07/2013 Document Reviewed: 02/06/2007 [facility] Patient Information 2014 [place].  CAUSES Pneumonia may be caused by bacteria or a virus. Usually, these infections are caused by breathing infectious particles into the lungs (respiratory tract). SYMPTOMS   Cough.  Fever.  Chest pain.  Increased rate of breathing.  Wheezing.  Mucus production. DIAGNOSIS  If you have the common symptoms of pneumonia, your caregiver will typically confirm the diagnosis with a chest X-ray. The X-ray will show an  abnormality in the lung (pulmonary infiltrate) if you have pneumonia. Other tests of your blood, urine, or sputum may be done to find the specific cause of your pneumonia. Your caregiver may also do tests (blood gases or pulse oximetry) to see how well your lungs are working. TREATMENT  Some forms of pneumonia may be spread to other people when you cough or sneeze. You may be asked to wear a mask before and during your exam. Pneumonia that is caused by bacteria is treated with antibiotic medicine. Pneumonia that is caused by the influenza virus may be treated with an antiviral medicine. Most other viral infections must run their course. These infections will not respond to antibiotics.  PREVENTION A pneumococcal shot (vaccine) is available to prevent a common bacterial cause of pneumonia. This is usually suggested for:  People over 50 years old.  Patients on chemotherapy.  People with chronic lung problems, such as bronchitis or emphysema.  People with immune system problems. If you are over 65 or have a high risk condition, you may receive the pneumococcal vaccine if you have not received it before. In some countries, a routine influenza vaccine is also recommended. This vaccine can help prevent some cases of pneumonia.You may be offered the influenza vaccine as part of  your care. If you smoke, it is time to quit. You may receive instructions on how to stop smoking. Your caregiver can provide medicines and counseling to help you quit. HOME CARE INSTRUCTIONS   Cough suppressants may be used if you are losing too much rest. However, coughing protects you by clearing your lungs. You should avoid using cough suppressants if you can.  Your caregiver may have prescribed medicine if he or she thinks your pneumonia is caused by a bacteria or influenza. Finish your medicine even if you start to feel better.  Your caregiver may also prescribe an expectorant. This loosens the mucus to be coughed  up.  Only take over-the-counter or prescription medicines for pain, discomfort, or fever as directed by your caregiver.  Do not smoke. Smoking is a common cause of bronchitis and can contribute to pneumonia. If you are a smoker and continue to smoke, your cough may last several weeks after your pneumonia has cleared.  A cold steam vaporizer or humidifier in your room or home may help loosen mucus.  Coughing is often worse at night. Sleeping in a semi-upright position in a recliner or using a couple pillows under your head will help with this.  Get rest as you feel it is needed. Your body will usually let you know when you need to rest. SEEK IMMEDIATE MEDICAL CARE IF:   Your illness becomes worse. This is especially true if you are elderly or weakened from any other disease.  You cannot control your cough with suppressants and are losing sleep.  You begin coughing up blood.  You develop pain which is getting worse or is uncontrolled with medicines.  You have a fever.  Any of the symptoms which initially brought you in for treatment are getting worse rather than better.  You develop shortness of breath or chest pain. MAKE SURE YOU:   Understand these instructions.  Will watch your condition.  Will get help right away if you are not doing well or get worse. Document Released: 06/17/2005 Document Revised: 09/09/2011 Document Reviewed: 09/06/2010 [facility] Patient Information 2014 [place], [place].

## 2013-07-25 NOTE — Progress Notes (Addendum)
Subjective:    Patient ID: Scott Dodson, male    DOB: [birth year], 63 y.o.   MRN: [id number] This chart was scribed for Merri Ray, MD by Anastasia Pall, ED Scribe. This patient was seen in room 12 and the patient's care was started at 4:47 PM.  Chief Complaint  Patient presents with  . Cough    x3 weeks, just dx with pneumonia Tuesday  . Rib pain    Pain on R side, started x3 days, "sharp/shooting"    HPI Scott Dodson is a 63 y.o. male Pt seen 5 days ago by Dr. Brigitte Pulse. Started on Zpack, Tussinex, and Atrovent nasal spray for right lower lobe pneumonia.   EXAM:  CHEST 2 VIEW - IMPRESSION:  Strandy densities in left lung base with small left pleural effusion  in addition to patchy airspace opacities in right lung base,  constellation of findings may reflect pneumonia and follow-up chest  radiograph is recommended after treatment to verify improvement.   Lab Results  Component Value Date   WBC 12.3* 07/20/2013   HGB 13.7* 07/20/2013   HCT 43.1* 07/20/2013   MCV 88.0 07/20/2013   PLT 138* 02/15/2013    Notes reviewed from phone call regarding this report. Here for follow up.   He reports his chest congestion seems to be improving, coming out. He reports sharp right rib pain, onset 3 days ago, after rotating his trunk. He states that laying down and deep breathing exacerbates his pain. He reports associated constant, subjective fever with his cough and congestion. He also reports associated night sweats. He reports taking his prescribed medication from his last visit regularly. He reports his feet have been cramping, but states he has been trying to drink a lot of Gatorade, fluids. He denies any long distant travel. He denies SOB, calf pain, and any other associated symptoms. He denies h/o DVT.   PCP - Scarlette Calico, MD  Patient Active Problem List   Diagnosis Date Noted  . CARDIOMYOPATHY, ISCHEMIC 05/18/2010  . CORONARY ARTERY DISEASE 10/11/2009  . DIABETES MELLITUS, TYPE II  10/09/2009  . HYPERLIPIDEMIA 10/09/2009  . HYPERTENSION 10/09/2009  . MYOCARDIAL INFARCTION, HX OF 10/09/2009   Past Medical History  Diagnosis Date  . Myocardial infarct   . Coronary artery disease     s/p lateral MI 4 /11, s/p Promus DES OM2  . Hypertension   . Hyperlipidemia   . Allergy   . Asthma   . Heart murmur    Past Surgical History  Procedure Laterality Date  . Coronary stent placement    . Tonsillectomy    . Adenoidectomy    . Vasectomy     No Known Allergies Prior to Admission medications   Medication Sig Start Date End Date Taking? Authorizing Provider  aspirin 81 MG tablet Take 81 mg by mouth daily.   Yes Historical Provider, MD  azithromycin (ZITHROMAX) 250 MG tablet Take 2 tabs PO x 1 dose, then 1 tab PO QD x 4 days 07/20/13  Yes Shawnee Knapp, MD  carvedilol (COREG) 25 MG tablet Take 1 tablet (25 mg total) by mouth 2 (two) times daily with a meal. 08/20/12  Yes Burnell Blanks, MD  chlorpheniramine-HYDROcodone (TUSSIONEX PENNKINETIC ER) 10-8 MG/5ML LQCR Take 5 mLs by mouth every 12 (twelve) hours as needed. 07/20/13  Yes Shawnee Knapp, MD  ipratropium (ATROVENT) 0.03 % nasal spray Place 2 sprays into the nose 4 (four) times daily. 07/20/13  Yes Shawnee Knapp, MD  lisinopril (PRINIVIL,ZESTRIL) 20 MG tablet Take 1 tablet (20 mg total) by mouth daily. 08/20/12 08/20/13 Yes Burnell Blanks, MD  atorvastatin (LIPITOR) 10 MG tablet Take 1 tablet (10 mg total) by mouth daily. 07/22/12   Burnell Blanks, MD   History   Social History  . Marital Status: Married    Spouse Name: N/A    Number of Children: N/A  . Years of Education: N/A   Occupational History  . Charity fundraiser    Social History Main Topics  . Smoking status: Former Research scientist (life sciences)  . Smokeless tobacco: Never Used  . Alcohol Use: Yes  . Drug Use: No  . Sexual Activity: Not on file   Other Topics Concern  . Not on file   Social History Narrative   Occupation- Actor    Former -tobacco -stopped 1979   Married ,3 children     Alcohol use - yes , social    Drug use - no   Regular exercise- yes     Review of Systems  Constitutional: Positive for fever (subjective) and diaphoresis.  Respiratory: Positive for cough. Negative for shortness of breath.   Cardiovascular: Positive for chest pain (right, rib).  Musculoskeletal: Positive for myalgias (Bilateral feet cramping. No calf pain.).       Objective:   Physical Exam  Vitals reviewed. Constitutional: He is oriented to person, place, and time. He appears well-developed and well-nourished.  HENT:  Head: Normocephalic and atraumatic.  Right Ear: Tympanic membrane, external ear and ear canal normal.  Left Ear: Tympanic membrane, external ear and ear canal normal.  Nose: No rhinorrhea.  Mouth/Throat: Oropharynx is clear and moist and mucous membranes are normal. No oropharyngeal exudate or posterior oropharyngeal erythema.  Eyes: Conjunctivae are normal. Pupils are equal, round, and reactive to light.  Neck: Neck supple.  Cardiovascular: Normal rate, regular rhythm, normal heart sounds and intact distal pulses.   No murmur heard. Pulmonary/Chest: Effort normal. He has no wheezes. He has rhonchi in the right lower field. He has no rales. He exhibits tenderness (Describes pain around lower scapular border on the right, but no focal tenderness. ).  Distant breath sounds throughout.   Abdominal: Soft. There is no tenderness.  Lymphadenopathy:    He has no cervical adenopathy.  Neurological: He is alert and oriented to person, place, and time.  Skin: Skin is warm and dry. No rash noted.  Skin is intact. No rash.  Psychiatric: He has a normal mood and affect. His behavior is normal.    BP 162/98  Pulse 77  Temp(Src) 98.4 F (36.9 C) (Oral)  Resp 16  Ht 5\' 9"  (1.753 m)  Wt 190 lb 6.4 oz (86.365 kg)  BMI 28.10 kg/m2  SpO2 96%  [facility] reading (PRIMARY) by Dr. Carlota Raspberry: CXR with rib series: L  pleural effusion and increased markings RLL>LLL, but improved aeration.   Results for orders placed in visit on 07/25/13  POCT CBC      Result Value Range   WBC 4.6  4.6 - 10.2 K/uL   Lymph, poc 1.0  0.6 - 3.4   POC LYMPH PERCENT 21.1  10 - 50 %L   MID (cbc) 0.7  0 - 0.9   POC MID % 14.4 (*) 0 - 12 %M   POC Granulocyte 3.0  2 - 6.9   Granulocyte percent 64.5  37 - 80 %G   RBC 4.89  4.69 - 6.13 M/uL   Hemoglobin 13.2 (*)  14.1 - 18.1 g/dL   HCT, POC 43.0 (*) 43.5 - 53.7 %   MCV 88.0  80 - 97 fL   MCH, POC 27.0  27 - 31.2 pg   MCHC 30.7 (*) 31.8 - 35.4 g/dL   RDW, POC 13.5     Platelet Count, POC 279  142 - 424 K/uL   MPV 9.4  0 - 99.8 fL          Assessment & Plan:   Scott Dodson is a 63 y.o. male Pneumonia - Plan: DG Ribs Unilateral W/Chest Right, DG Chest 1 View, POCT CBC  Pleural effusion - Plan: DG Ribs Unilateral W/Chest Right, DG Chest 1 View, POCT CBC  Chest wall pain - Plan: DG Ribs Unilateral W/Chest Right, DG Chest 1 View, POCT CBC, HYDROcodone-acetaminophen (NORCO/VICODIN) 5-325 MG per tablet  Fever and chills - Plan: DG Ribs Unilateral W/Chest Right, DG Chest 1 View, POCT CBC  Pneumonia with L pleural effusion.  clinically and symptomatically appears to be improving. Status post Zpack witih improved WBC on blood counts and chest xray appears to be improved, but will have radiology over read.  Chest wall pain likely due to intercostals, so can try Ibuprofen, but if needed Lortab was also prescribed. Discussed using this or Hydrocodone cough syrup, not to combine. Recheck in the next 3 days wit myself or Dr. Brigitte Pulse. ER/return to clinic precautions discussed.  Meds ordered this encounter  Medications  . HYDROcodone-acetaminophen (NORCO/VICODIN) 5-325 MG per tablet    Sig: Take 1 tablet by mouth every 6 (six) hours as needed for moderate pain.    Dispense:  15 tablet    Refill:  0   Patient Instructions  Your blood count is improved and chest xray also looks to be  improved today, but not back to normal. I don't see a rib fracture, so you may have pulled a muscle with coughing. It is okay to use Ibuprofen or Aleve over the counter as needed in addition to the other pain medication as discussed, but this should not be taken with cough syrup. Use one or the other. Recheck with myself or Dr. Brigitte Pulse in the next 3-4 days. Return to the clinic or go to the nearest emergency room if any of your symptoms worsen or new symptoms occur.    Pneumonia, Adult Pneumonia is an infection of the lungs. Pleural Effusion The lining covering your lungs and the inside of your chest is called the pleura. Usually, the space between the 2 pleura contains no air and only a thin layer of fluid. A pleural effusion is an abnormal buildup of fluid in the pleural space. Fluid gathers when there is increased pressure in the lung vessels. This forces fluids out of the lungs and into the pleural space. Vessels may also leak fluids when there are infections, such as pneumonia, or other causes of soreness and redness (inflammation). Fluids leak into the lungs when protein in the blood is low or when certain vessels (lymphatics) are blocked. Finding a pleural effusion is important because it is usually caused by another disease. In order to treat a pleural effusion, your caregiver needs to find its cause. If left untreated, a large amount of fluid can build up and cause collapse of the lung. CAUSES   Heart failure.  Infections (pneumonia, tuberculosis), pulmonary embolism, pulmonary infarction.  Cancer (primary lung and metastatic), asbestosis.  Liver failure (cirrhosis).  Nephrotic syndrome, peritoneal dialysis, kidney problems (uremia).  Collagen vascular disease (systemic lupus erythematosis,  rheumatoid arthritis).  Injury (trauma) to the chest or rupture of the digestive tube (esophagus).  Material in the chest or pleural space (hemothorax,  chylothorax).  Pancreatitis.  Surgery.  Drug reactions. SYMPTOMS  A pleural effusion can decrease the amount of space available for breathing and make you short of breath. The fluid can become infected, which may cause pain and fever. Often, the pain is worse when taking a deep breath. The underlying disease (heart failure, pneumonia, blood clot, tuberculosis, cancer) may also cause symptoms. DIAGNOSIS   Your caregiver can usually tell what is wrong by talking to you (taking a history), doing an exam, and taking a routine X-ray. If the X-ray shows fluid in your chest, often fluid is removed from your chest with a needle for testing (diagnostic thoracentesis).  Sometimes, more specialized X-rays may be needed.  Sometimes, a small piece of tissue is removed and examined by a specialist (biopsy). TREATMENT  Treatment varies based on what caused the pleural effusion. Treatments include:  Removing as much fluid as possible using a needle (thoracentesis) to improve the cough and shortness of breath. This is a simple procedure which can be done at bedside. The risks are bleeding, infection, collapse of a lung, or low blood pressure.  Placing a tube in the chest to drain the effusion (tube thoracostomy). This is often used when there is an infection in the fluid. This is a simple procedure which can often be done at bedside or in a clinic. The procedure may be painful. The risks are the same as using a needle to drain the fluid. The chest tube usually remains for a few days and is connected to suction to improve fluid drainage. The tube, after placement, usually does not cause much discomfort.  Surgical removal of fibrous debris in and around the pleural space (decortication). This may be done with a flexible telescope (thoracoscope) through a small or large cut (incision). This is helpful for patients who have fibrosis or scar tissue that prevents complete lung expansion. The risks are infection,  blood loss, and side effects from general anesthesia.  Sometimes, a procedure called pleurodesis is done. A chest tube is placed and the fluid is drained. Next, an agent (tetracycline, talc powder) is added to the pleural space. This causes the lung and chest wall to stick together (adhesion). This leaves no potential space for fluid to build up. The risks include infection, blood loss, and side effects from general anesthesia.  If the effusion is caused by infection, it may be treated with antibiotics and improve without draining. HOME CARE INSTRUCTIONS   Take any medicines exactly as prescribed.  Follow up with your caregiver as directed.  Monitor your exercise capacity (the amount of walking you can do before you get short of breath).  Do not smoke. Ask your caregiver for help quitting. SEEK MEDICAL CARE IF:   Your exercise capacity seems to get worse or does not improve with time.  You do not recover from your illness. SEEK IMMEDIATE MEDICAL CARE IF:   Shortness of breath or chest pain develops or gets worse.  You have an oral temperature above 102 F (38.9 C), not controlled by medicine.  You develop a new cough, especially if the mucus (phlegm) is discolored. MAKE SURE YOU:   Understand these instructions.  Will watch your condition.  Will get help right away if you are not doing well or get worse. Document Released: 06/17/2005 Document Revised: 04/07/2013 Document Reviewed: 02/06/2007 Exit[employer] Patient Information  2014 [place], [place].  CAUSES Pneumonia may be caused by bacteria or a virus. Usually, these infections are caused by breathing infectious particles into the lungs (respiratory tract). SYMPTOMS   Cough.  Fever.  Chest pain.  Increased rate of breathing.  Wheezing.  Mucus production. DIAGNOSIS  If you have the common symptoms of pneumonia, your caregiver will typically confirm the diagnosis with a chest X-ray. The X-ray will show an abnormality in  the lung (pulmonary infiltrate) if you have pneumonia. Other tests of your blood, urine, or sputum may be done to find the specific cause of your pneumonia. Your caregiver may also do tests (blood gases or pulse oximetry) to see how well your lungs are working. TREATMENT  Some forms of pneumonia may be spread to other people when you cough or sneeze. You may be asked to wear a mask before and during your exam. Pneumonia that is caused by bacteria is treated with antibiotic medicine. Pneumonia that is caused by the influenza virus may be treated with an antiviral medicine. Most other viral infections must run their course. These infections will not respond to antibiotics.  PREVENTION A pneumococcal shot (vaccine) is available to prevent a common bacterial cause of pneumonia. This is usually suggested for:  People over 72 years old.  Patients on chemotherapy.  People with chronic lung problems, such as bronchitis or emphysema.  People with immune system problems. If you are over 65 or have a high risk condition, you may receive the pneumococcal vaccine if you have not received it before. In some countries, a routine influenza vaccine is also recommended. This vaccine can help prevent some cases of pneumonia.You may be offered the influenza vaccine as part of your care. If you smoke, it is time to quit. You may receive instructions on how to stop smoking. Your caregiver can provide medicines and counseling to help you quit. HOME CARE INSTRUCTIONS   Cough suppressants may be used if you are losing too much rest. However, coughing protects you by clearing your lungs. You should avoid using cough suppressants if you can.  Your caregiver may have prescribed medicine if he or she thinks your pneumonia is caused by a bacteria or influenza. Finish your medicine even if you start to feel better.  Your caregiver may also prescribe an expectorant. This loosens the mucus to be coughed up.  Only take  over-the-counter or prescription medicines for pain, discomfort, or fever as directed by your caregiver.  Do not smoke. Smoking is a common cause of bronchitis and can contribute to pneumonia. If you are a smoker and continue to smoke, your cough may last several weeks after your pneumonia has cleared.  A cold steam vaporizer or humidifier in your room or home may help loosen mucus.  Coughing is often worse at night. Sleeping in a semi-upright position in a recliner or using a couple pillows under your head will help with this.  Get rest as you feel it is needed. Your body will usually let you know when you need to rest. SEEK IMMEDIATE MEDICAL CARE IF:   Your illness becomes worse. This is especially true if you are elderly or weakened from any other disease.  You cannot control your cough with suppressants and are losing sleep.  You begin coughing up blood.  You develop pain which is getting worse or is uncontrolled with medicines.  You have a fever.  Any of the symptoms which initially brought you in for treatment are getting worse rather than  better.  You develop shortness of breath or chest pain. MAKE SURE YOU:   Understand these instructions.  Will watch your condition.  Will get help right away if you are not doing well or get worse. Document Released: 06/17/2005 Document Revised: 09/09/2011 Document Reviewed: 09/06/2010 [facility] Patient Information 2014 [place], [place].       I personally performed the services described in this documentation, which was scribed in my presence. The recorded information has been reviewed and considered, and addended by me as needed.

## 2013-08-11 ENCOUNTER — Other Ambulatory Visit: Payer: Self-pay | Admitting: Cardiovascular Disease

## 2013-10-10 ENCOUNTER — Other Ambulatory Visit: Payer: Self-pay | Admitting: Cardiovascular Disease

## 2013-12-02 ENCOUNTER — Telehealth: Payer: Self-pay | Admitting: Cardiovascular Disease

## 2013-12-02 NOTE — Telephone Encounter (Signed)
New message         Pt appt is 7/8 @ 4pm with dr Angelena Form / pt needs refill of medication lisinopril 20mg 

## 2013-12-03 ENCOUNTER — Other Ambulatory Visit: Payer: Self-pay

## 2013-12-03 MED ORDER — LISINOPRIL 20 MG PO TABS: ORAL_TABLET | ORAL | Status: DC

## 2014-01-05 ENCOUNTER — Encounter: Payer: Self-pay | Admitting: Cardiovascular Disease

## 2014-01-05 ENCOUNTER — Ambulatory Visit (INDEPENDENT_AMBULATORY_CARE_PROVIDER_SITE_OTHER): Payer: 59 | Admitting: Cardiovascular Disease

## 2014-01-05 VITALS — BP 142/90 | HR 65 | Ht 69.0 in | Wt 198.0 lb

## 2014-01-05 DIAGNOSIS — E785 Hyperlipidemia, unspecified: Secondary | ICD-10-CM

## 2014-01-05 DIAGNOSIS — I10 Essential (primary) hypertension: Secondary | ICD-10-CM

## 2014-01-05 DIAGNOSIS — I2510 Atherosclerotic heart disease of native coronary artery without angina pectoris: Secondary | ICD-10-CM

## 2014-01-05 MED ORDER — ATORVASTATIN CALCIUM 10 MG PO TABS: 10.0000 mg | ORAL_TABLET | Freq: Every day | ORAL | Status: DC

## 2014-01-05 MED ORDER — LISINOPRIL 40 MG PO TABS
40.0000 mg | ORAL_TABLET | Freq: Every day | ORAL | Status: DC
Start: 2014-01-05 — End: 2016-11-13

## 2014-01-05 NOTE — Progress Notes (Signed)
History of Present Illness: 63 yo WM with history of CAD, HTN, hyperlipidemia and DM here today for cardiac follow up. He was admitted 10/02/09 with an acute lateral STEMI . He was cathed by Dr. Burt Knack who found a totally occluded second obtuse marginal branch of the Circumflex. A Promus drug eluting stent was placed in the ostium of the second obtuse marginal branch of the Circumflex. He did well post MI and was discharged home on 10/05/09.   He is here today for cardiac follow up.  He is having no chest pain, SOB, dizziness, palpitations, near syncope or syncope.   Primary Care Physician: Scarlette Calico  Last Lipid Profile:  Lipid Panel     Component Value Date/Time   CHOL 150 08/11/2012 0939   TRIG 238.0* 08/11/2012 0939   HDL 35.80* 08/11/2012 0939   CHOLHDL 4 08/11/2012 0939   VLDL 47.6* 08/11/2012 0939   LDLCALC 76 08/11/2012 0939    Past Medical History  Diagnosis Date  . Myocardial infarct   . Coronary artery disease     s/p lateral MI 4 /11, s/p Promus DES OM2  . Hypertension   . Hyperlipidemia   . Allergy   . Asthma   . Heart murmur     Past Surgical History  Procedure Laterality Date  . Coronary stent placement    . Tonsillectomy    . Adenoidectomy    . Vasectomy      Current Outpatient Prescriptions  Medication Sig Dispense Refill  . aspirin 81 MG tablet Take 81 mg by mouth daily.      Marland Kitchen atorvastatin (LIPITOR) 10 MG tablet Take 1 tablet (10 mg total) by mouth daily.  90 tablet  3  . carvedilol (COREG) 25 MG tablet TAKE 1 TABLET (25 MG TOTAL) BY MOUTH 2 (TWO) TIMES DAILY WITH A MEAL.  60 tablet  0  . lisinopril (PRINIVIL,ZESTRIL) 20 MG tablet TAKE 1 TABLET (20 MG TOTAL) BY MOUTH DAILY.  30 tablet  1   No current facility-administered medications for this visit.    No Known Allergies  History   Social History  . Marital Status: Married    Spouse Name: N/A    Number of Children: N/A  . Years of Education: N/A   Occupational History  . Engineer, building services    Social History Main Topics  . Smoking status: Former Research scientist (life sciences)  . Smokeless tobacco: Never Used  . Alcohol Use: Yes  . Drug Use: No  . Sexual Activity: Not on file   Other Topics Concern  . Not on file   Social History Narrative   Occupation- Photographer    Former -tobacco -stopped 1979   Married ,3 children     Alcohol use - yes , social    Drug use - no   Regular exercise- yes     Family History  Problem Relation Age of Onset  . Heart disease Mother   . Hypertension Mother   . Heart disease Father   . Hypertension Father   . Heart disease Sister   . Heart disease Brother     Review of Systems:  As stated in the HPI and otherwise negative.   BP 142/90  Pulse 65  Ht 5\' 9"  (1.753 m)  Wt 198 lb (89.812 kg)  BMI 29.23 kg/m2  Physical Examination: General: Well developed, well nourished, NAD HEENT: OP clear, mucus membranes moist SKIN: warm, dry. No rashes. Neuro: No focal deficits Musculoskeletal: Muscle  strength 5/5 all ext Psychiatric: Mood and affect normal Neck: No JVD, no carotid bruits, no thyromegaly, no lymphadenopathy. Lungs:Clear bilaterally, no wheezes, rhonci, crackles Cardiovascular: Regular rate and rhythm. No murmurs, gallops or rubs. Abdomen:Soft. Bowel sounds present. Non-tender.  Extremities: No lower extremity edema. Pulses are 2 + in the bilateral DP/PT.  EKG: NSR, rate 65 bpm. Non-specific T wave abnormality.   Cardiac Cath 10/02/2009   Left mainstem is patent and there is no  significant stenosis present. The vessel divides into the LAD and left  circumflex.  LAD: The LAD is a large-caliber vessel that reaches the left  ventricular apex. It supplies a large first diagonal branch and small  second and third diagonals. The mid LAD has a 30-40% stenosis and there  is mild diffuse plaque with minor luminal irregularities further down in  the mid and distal vessel. There is TIMI 3 flow in the LAD.  Left circumflex: The  left circumflex is dominant. The vessel has minor  irregularities in the proximal and midportions, but no areas of  significant stenosis. The first OM is small. The second OM is totally  occluded and this is the infarct artery. There is a left posterolateral  and left PDA branch present without significant stenosis.  Right coronary artery: The RCA is a small, nondominant vessel. There  is an 80-90% stenosis in the midportion of the vessel.  Left ventriculography: The LVEF is 45%. There is inferoapical  hypokinesis. There is no significant mitral regurgitation.   Echo 2011:  Left ventricle: The cavity size was normal. Wall thickness was  increased in a pattern of mild LVH. Systolic function was mildly to  moderately reduced. The estimated ejection fraction was in the range  of 40% to 45%. There is severe hypokinesis of the basal-mid  inferoposterior myocardium. Doppler parameters are consistent with  abnormal left ventricular relaxation (grade 1 diastolic  dysfunction).   Assessment and Plan:   1. CAD: s/p lateral STEMI 2011 with placement of a drug eluting stent in the second OM. Doing well. Will continue ASA, Coreg, Ace-inhibitor and statin.   2. HTN: BP elevated. Will increase Lisinopril to 40 mg daily. BMET one week.   3. HYPERLIPIDEMIA: Continue statin. Due for repeat lipids and LFTs now.

## 2014-01-05 NOTE — Patient Instructions (Signed)
Your physician wants you to follow-up in:  12 months.  You will receive a reminder letter in the mail two months in advance. If you don't receive a letter, please call our office to schedule the follow-up appointment.  Your physician recommends that you return for fasting lab work next week.  (Tuesday).  The lab opens at 7:30 AM  Your physician has recommended you make the following change in your medication:  Increase Lisinopril to 40 mg by mouth daily

## 2014-01-11 ENCOUNTER — Other Ambulatory Visit (INDEPENDENT_AMBULATORY_CARE_PROVIDER_SITE_OTHER): Payer: 59

## 2014-01-11 DIAGNOSIS — I2510 Atherosclerotic heart disease of native coronary artery without angina pectoris: Secondary | ICD-10-CM

## 2014-01-11 LAB — LIPID PANEL
CHOLESTEROL: 131 mg/dL (ref 0–200)
HDL: 43.4 mg/dL (ref >39.00)
LDL Cholesterol: 56 mg/dL (ref 0–99)
NONHDL: 87.6
Total CHOL/HDL Ratio: 3
Triglycerides: 159 mg/dL — ABNORMAL HIGH (ref 0.0–149.0)
VLDL: 31.8 mg/dL (ref 0.0–40.0)

## 2014-01-11 LAB — BASIC METABOLIC PANEL
BUN: 20 mg/dL (ref 6–23)
CHLORIDE: 106 meq/L (ref 96–112)
CO2: 24 meq/L (ref 19–32)
Calcium: 9.1 mg/dL (ref 8.4–10.5)
Creatinine, Ser: 1.1 mg/dL (ref 0.4–1.5)
GFR: 76.33 mL/min (ref >60.00)
Glucose, Bld: 236 mg/dL — ABNORMAL HIGH (ref 70–99)
Potassium: 3.9 mEq/L (ref 3.5–5.1)
SODIUM: 137 meq/L (ref 135–145)

## 2014-01-11 LAB — HEPATIC FUNCTION PANEL
ALT: 22 U/L (ref 0–53)
AST: 16 U/L (ref 0–37)
Albumin: 4.1 g/dL (ref 3.5–5.2)
Alkaline Phosphatase: 62 U/L (ref 39–117)
BILIRUBIN DIRECT: 0 mg/dL (ref 0.0–0.3)
BILIRUBIN TOTAL: 0.7 mg/dL (ref 0.2–1.2)
Total Protein: 6.6 g/dL (ref 6.0–8.3)

## 2014-05-18 ENCOUNTER — Ambulatory Visit (INDEPENDENT_AMBULATORY_CARE_PROVIDER_SITE_OTHER): Payer: 59 | Admitting: Family Medicine

## 2014-05-18 VITALS — BP 142/94 | HR 58 | Temp 98.2°F | Resp 17 | Ht 69.5 in | Wt 196.0 lb

## 2014-05-18 DIAGNOSIS — Z7689 Persons encountering health services in other specified circumstances: Secondary | ICD-10-CM

## 2014-05-18 DIAGNOSIS — Z0000 Encounter for general adult medical examination without abnormal findings: Secondary | ICD-10-CM

## 2014-05-18 NOTE — Progress Notes (Signed)
Subjective: Here for a pertussis Vaccine. He got tdAP last year. He did not realizeTHAT IT INCLUDED PERTUSSIS. hE IS GOING TO SEE HIS GRANDCHILD TOMORROW.  PLAN: cANCEL VISIT

## 2014-05-18 NOTE — Patient Instructions (Signed)
No injection needed

## 2014-09-13 ENCOUNTER — Ambulatory Visit (INDEPENDENT_AMBULATORY_CARE_PROVIDER_SITE_OTHER): Payer: 59

## 2014-09-13 ENCOUNTER — Ambulatory Visit (INDEPENDENT_AMBULATORY_CARE_PROVIDER_SITE_OTHER): Payer: 59 | Admitting: Emergency Medicine

## 2014-09-13 ENCOUNTER — Encounter: Payer: Self-pay | Admitting: Family Medicine

## 2014-09-13 VITALS — BP 150/88 | HR 86 | Temp 98.6°F | Resp 16 | Ht 69.0 in | Wt 198.0 lb

## 2014-09-13 DIAGNOSIS — R05 Cough: Secondary | ICD-10-CM

## 2014-09-13 DIAGNOSIS — J069 Acute upper respiratory infection, unspecified: Secondary | ICD-10-CM

## 2014-09-13 DIAGNOSIS — R059 Cough, unspecified: Secondary | ICD-10-CM

## 2014-09-13 LAB — POCT CBC
Granulocyte percent: 85.8 %G — AB (ref 37–80)
HEMATOCRIT: 44.4 % (ref 43.5–53.7)
Hemoglobin: 14 g/dL — AB (ref 14.1–18.1)
Lymph, poc: 0.9 (ref 0.6–3.4)
MCH: 26.4 pg — AB (ref 27–31.2)
MCHC: 31.6 g/dL — AB (ref 31.8–35.4)
MCV: 83.6 fL (ref 80–97)
MID (cbc): 0.3 (ref 0–0.9)
MPV: 7.4 fL (ref 0–99.8)
PLATELET COUNT, POC: 192 10*3/uL (ref 142–424)
POC Granulocyte: 7.4 — AB (ref 2–6.9)
POC LYMPH PERCENT: 10.8 %L (ref 10–50)
POC MID %: 3.4 %M (ref 0–12)
RBC: 5.31 M/uL (ref 4.69–6.13)
RDW, POC: 13.2 %
WBC: 8.6 10*3/uL (ref 4.6–10.2)

## 2014-09-13 MED ORDER — HYDROCOD POLST-CHLORPHEN POLST 10-8 MG/5ML PO LQCR: 5.0000 mL | Freq: Two times a day (BID) | ORAL | Status: DC | PRN

## 2014-09-13 MED ORDER — AZITHROMYCIN 250 MG PO TABS: ORAL_TABLET | ORAL | Status: DC

## 2014-09-13 MED ORDER — ALBUTEROL SULFATE (2.5 MG/3ML) 0.083% IN NEBU
2.5000 mg | INHALATION_SOLUTION | Freq: Once | RESPIRATORY_TRACT | Status: AC
  Administered 2014-09-13: 2.5 mg via RESPIRATORY_TRACT

## 2014-09-13 MED ORDER — ALBUTEROL SULFATE HFA 108 (90 BASE) MCG/ACT IN AERS: 2.0000 | INHALATION_SPRAY | RESPIRATORY_TRACT | Status: DC | PRN

## 2014-09-13 MED ORDER — IPRATROPIUM BROMIDE 0.02 % IN SOLN
0.5000 mg | Freq: Once | RESPIRATORY_TRACT | Status: AC
  Administered 2014-09-13: 0.5 mg via RESPIRATORY_TRACT
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
## 2014-09-13 NOTE — Patient Instructions (Signed)
Take ibuprofen or advil  2-3 times a day for muscle aches or fever. If you have worsening SOB or cough, return to clinic or go to ER Increase fluids.

## 2014-10-20 ENCOUNTER — Other Ambulatory Visit: Payer: Self-pay | Admitting: *Deleted

## 2015-01-15 IMAGING — CR DG CHEST 2V
2 series · 2 of 2 positions shown · non-contrast
Comparison: 10/02/2009

CLINICAL DATA: Shortness of breath.  Nasal congestion.

CHEST - 2 VIEW

[view not recorded (1 of 2)]
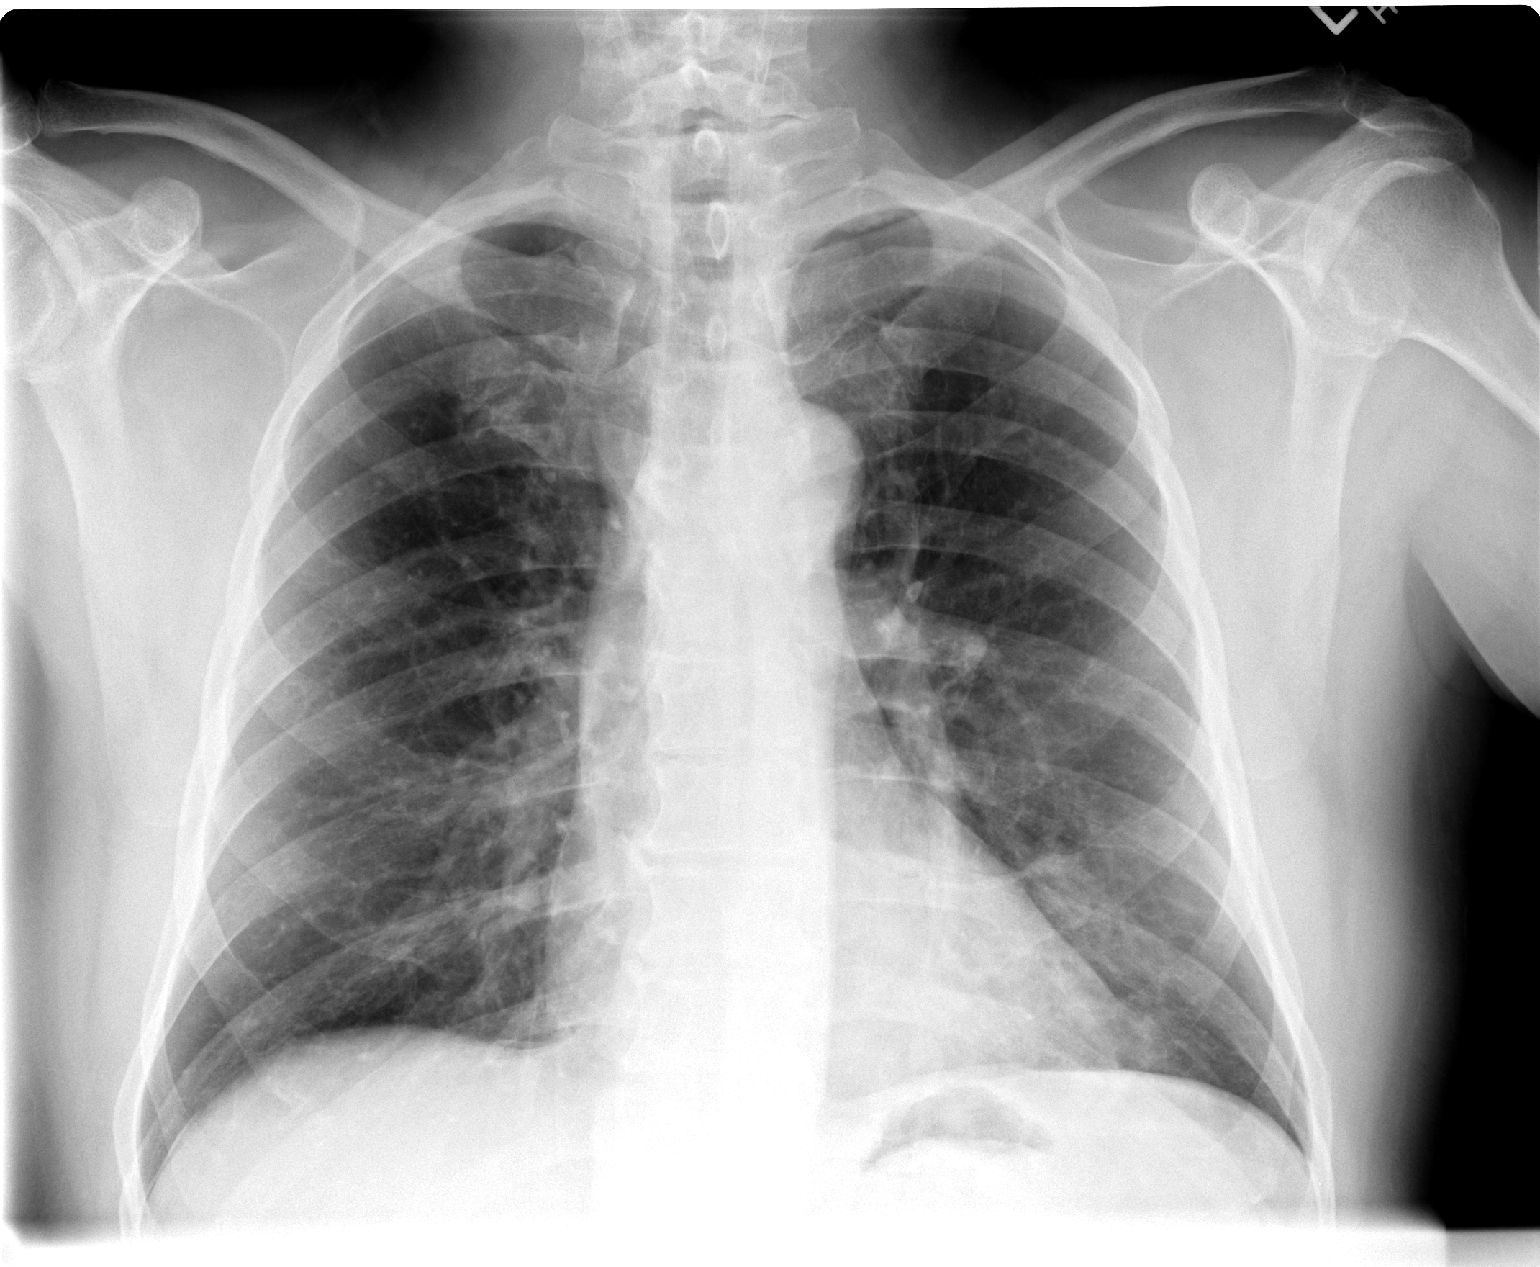

[view not recorded (2 of 2)]
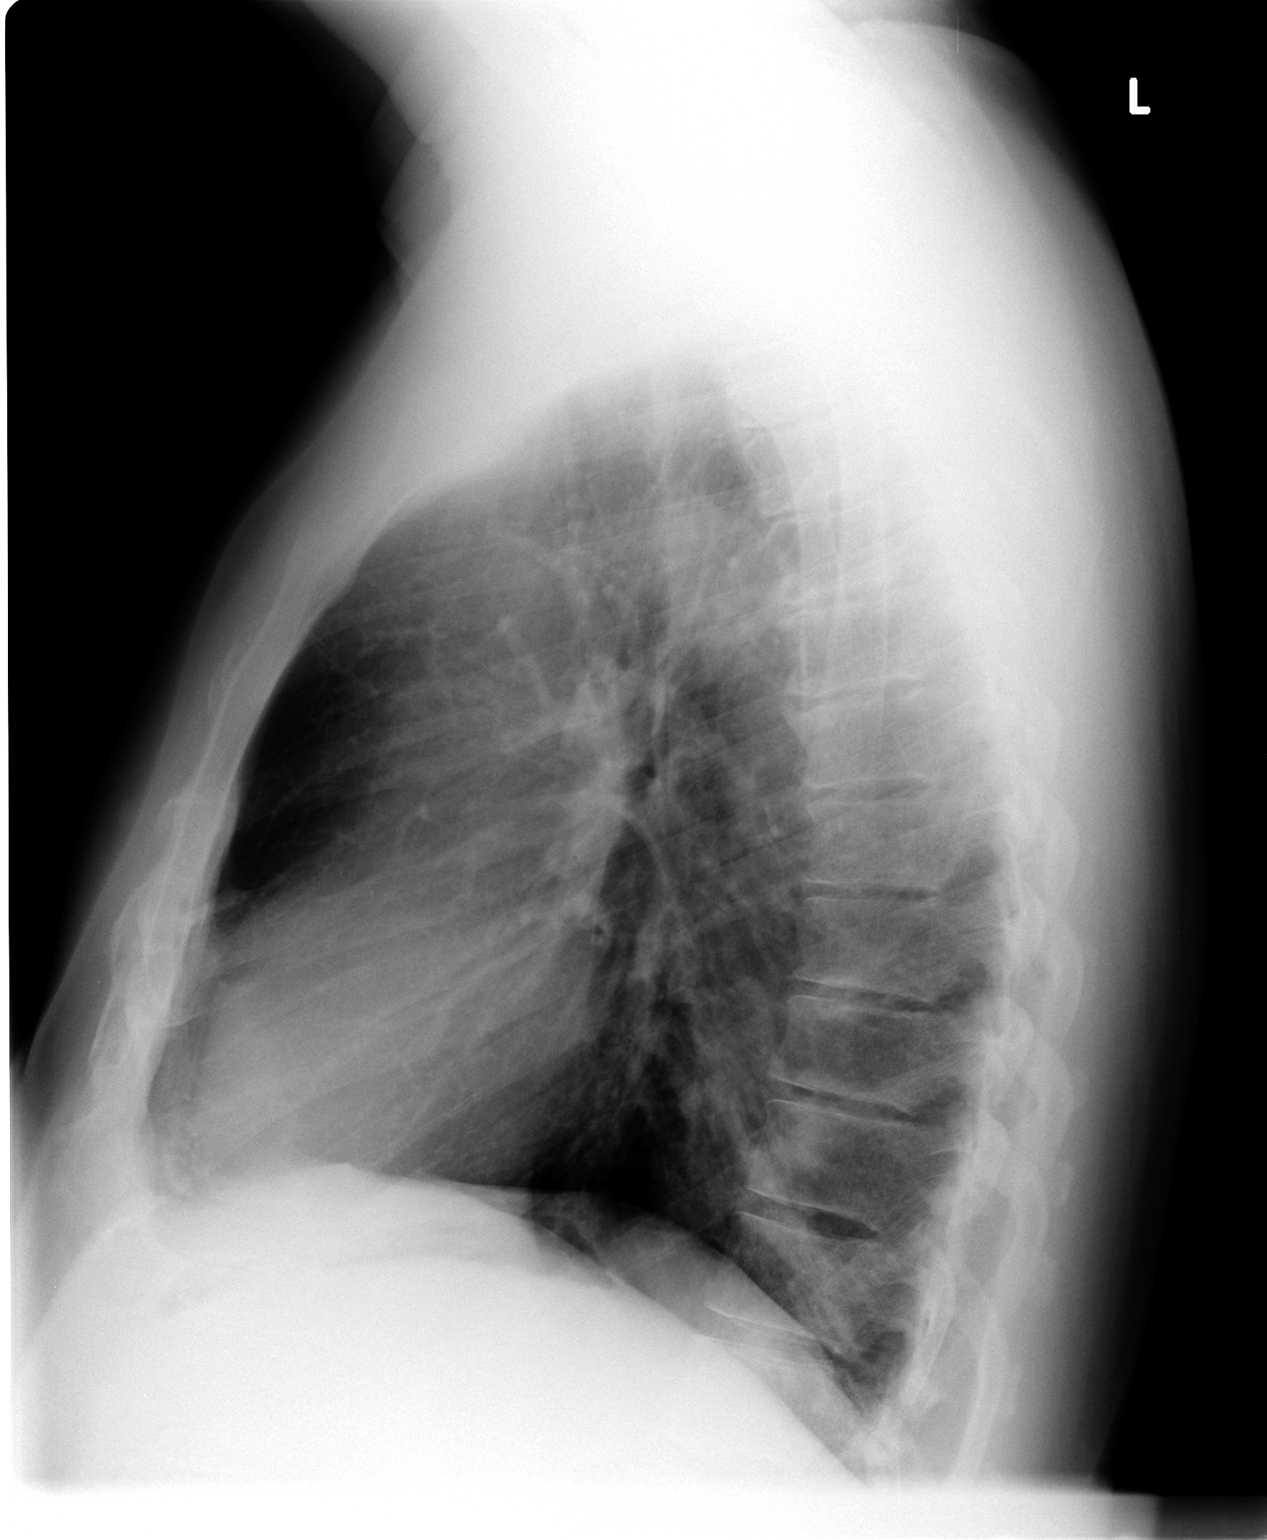

[2 of 2 positions shown; findings below may reference images not displayed]

FINDINGS: There is a focal area of atelectasis at the left base
posteriorly.  There is also peribronchial thickening consistent
bronchitis.  No consolidative infiltrate or effusion.  Heart size
and vascularity are normal.  No acute osseous abnormality.
IMPRESSION: Bronchitic changes with focal atelectasis at the left base
posteriorly.

## 2016-11-13 ENCOUNTER — Ambulatory Visit (INDEPENDENT_AMBULATORY_CARE_PROVIDER_SITE_OTHER): Payer: BLUE CROSS/BLUE SHIELD | Admitting: Family Medicine

## 2016-11-13 ENCOUNTER — Encounter: Payer: Self-pay | Admitting: Family Medicine

## 2016-11-13 VITALS — BP 150/90 | HR 95 | Temp 98.4°F | Resp 18 | Ht 69.0 in | Wt 193.6 lb

## 2016-11-13 DIAGNOSIS — R05 Cough: Secondary | ICD-10-CM

## 2016-11-13 DIAGNOSIS — J45901 Unspecified asthma with (acute) exacerbation: Secondary | ICD-10-CM | POA: Diagnosis not present

## 2016-11-13 DIAGNOSIS — J069 Acute upper respiratory infection, unspecified: Secondary | ICD-10-CM

## 2016-11-13 DIAGNOSIS — R059 Cough, unspecified: Secondary | ICD-10-CM

## 2016-11-13 DIAGNOSIS — R0600 Dyspnea, unspecified: Secondary | ICD-10-CM | POA: Diagnosis not present

## 2016-11-13 MED ORDER — PREDNISONE 10 MG PO TABS: ORAL_TABLET | ORAL | 0 refills | Status: DC

## 2016-11-13 MED ORDER — IPRATROPIUM BROMIDE 0.02 % IN SOLN
0.5000 mg | Freq: Once | RESPIRATORY_TRACT | Status: AC
  Administered 2016-11-13: 0.5 mg via RESPIRATORY_TRACT

## 2016-11-13 MED ORDER — ALBUTEROL SULFATE (2.5 MG/3ML) 0.083% IN NEBU
2.5000 mg | INHALATION_SOLUTION | Freq: Once | RESPIRATORY_TRACT | Status: AC
  Administered 2016-11-13: 2.5 mg via RESPIRATORY_TRACT

## 2016-11-13 MED ORDER — ALBUTEROL SULFATE HFA 108 (90 BASE) MCG/ACT IN AERS: 2.0000 | INHALATION_SPRAY | RESPIRATORY_TRACT | 1 refills | Status: DC | PRN

## 2016-11-13 MED ORDER — LISINOPRIL 40 MG PO TABS: 40.0000 mg | ORAL_TABLET | Freq: Every day | ORAL | 0 refills | Status: DC

## 2016-11-13 MED ORDER — CARVEDILOL 25 MG PO TABS: ORAL_TABLET | ORAL | 0 refills | Status: DC

## 2016-11-13 NOTE — Patient Instructions (Addendum)
I think you are having an asthma exacerbation from a combination of your recent illness and the pollen in the air.   Use the albuterol inhaler every 4 - 6 hours as needed for any cough or wheezing.  Take the prednisone as follows:  Take 6 pills today, 5 pills tomorrow, 4 pills today after, 3 pills after, 2 pills after, 1 pill last day  Your EKG was the same as in 2015.  It does not show that anything is going on with your heart right now.  Come back to see [place] in a couple weeks so we can recheck your blood pressure.  It would also be good to address your other medical issues then as well.    IF you received an x-ray today, you will receive an invoice from [facility]. Please contact [facility] at [phone] with questions or concerns regarding your invoice.   IF you received labwork today, you will receive an invoice from [place]. Please contact [facility] at [phone] with questions or concerns regarding your invoice.   Our billing staff will not be able to assist you with questions regarding bills from these companies.  You will be contacted with the lab results as soon as they are available. The fastest way to get your results is to activate your My Chart account. Instructions are located on the last page of this paperwork. If you have not heard from [place] regarding the results in 2 weeks, please contact this office.

## 2016-11-13 NOTE — Progress Notes (Signed)
Scott Dodson is a 66 y.o. male who presents to Primary Care at [facility] today for dyspnea:  1.  Dyspnea:  Patient with recent "chest congestion" that lasted for about 2 weeks, occurred 3 weeks ago.  Resolved with OTC medications. Towards tail end of infection, began having worsening cough and shortness of breath on exertion.  This has persisted.  No further sinus drainage.  Cough was productive of thick green sputum, but currently just dry/hacking.  Worse at night.  Has difficulty sleeping at night due to hacking cough.    - PMH signficant for MI s/p stent placement in 2011.  He is not currently taking any daily medications -- no statin, beta blocker, HTN meds, or inhaler.  Denies any chest pain, dyspnea, palpitations except for past week.  No fevers, chills, N/V.    Also history of "adult-onset asthma."  Previously has been treated with albuterol inhalers for this.    ROS as above.  Marland Kitchen   PMH reviewed. Patient is a nonsmoker.   Past Medical History:  Diagnosis Date  . Allergy   . Asthma   . Coronary artery disease    s/p lateral MI 4 /11, s/p Promus DES OM2  . Heart murmur   . Hyperlipidemia   . Hypertension   . Myocardial infarct [facility])    Past Surgical History:  Procedure Laterality Date  . ADENOIDECTOMY    . CORONARY STENT PLACEMENT    . TONSILLECTOMY    . VASECTOMY      Medications reviewed. Current Outpatient Prescriptions  Medication Sig Dispense Refill  . albuterol (PROVENTIL HFA;VENTOLIN HFA) 108 (90 BASE) MCG/ACT inhaler Inhale 2 puffs into the lungs every 4 (four) hours as needed for wheezing or shortness of breath (cough, shortness of breath or wheezing.). (Patient not taking: Reported on 11/13/2016) 1 Inhaler 1  . aspirin 81 MG tablet Take 81 mg by mouth daily.    Marland Kitchen atorvastatin (LIPITOR) 10 MG tablet Take 1 tablet (10 mg total) by mouth daily. (Patient not taking: Reported on 11/13/2016) 90 tablet 3  . azithromycin (ZITHROMAX) 250 MG tablet Take 2 tablets today then 1  a day until finished (Patient not taking: Reported on 11/13/2016) 6 tablet 0  . carvedilol (COREG) 25 MG tablet TAKE 1 TABLET (25 MG TOTAL) BY MOUTH 2 (TWO) TIMES DAILY WITH A MEAL. (Patient not taking: Reported on 11/13/2016) 60 tablet 0  . chlorpheniramine-HYDROcodone (TUSSIONEX PENNKINETIC ER) 10-8 MG/5ML LQCR Take 5 mLs by mouth every 12 (twelve) hours as needed. (Patient not taking: Reported on 11/13/2016) 140 mL 0  . lisinopril (PRINIVIL,ZESTRIL) 40 MG tablet Take 1 tablet (40 mg total) by mouth daily. (Patient not taking: Reported on 11/13/2016) 90 tablet 3   No current facility-administered medications for this visit.      Physical Exam:  BP (!) 171/106   Pulse 95   Temp 98.4 F (36.9 C) (Oral)   Resp 18   Ht 5\' 9"  (1.753 m)   Wt 193 lb 9.6 oz (87.8 kg)   SpO2 97%   BMI 28.59 kg/m  Gen:  Patient sitting on exam table, appears stated age in no acute distress Head: Normocephalic atraumatic Eyes: EOMI, PERRL, sclera and conjunctiva non-erythematous Ears:  Canals clear bilaterally.  TMs pearly gray bilaterally without erythema or bulging.   Nose:  Nasal turbinates with some exudates BL Mouth: Mucosa membranes moist. Tonsils +2, nonenlarged, non-erythematous. Neck: No cervical lymphadenopathy noted Heart:  RRR, no murmurs auscultated. Pulm:  Clear to auscultation bilaterally  with good air movement.  No wheezes or rales noted.    EKG:  Sinus rhtyhm.  With some mild T wave flattening in inferior lead (present prior EKG in 2015).  Question of atrial enlargment.    Assessment and Plan:  1.  Dyspnea: - asthma exacerbation, triggered by recent viral illness and pollen - no evidence of acute CAD ischemia.  - infectious symptoms have all resolved.  Treating as asthma exacerbation with inhaler plus prednisone  2.  HTN: - I have refilled his blood pressure medicines -- ACE-I and beta blocker - to FU in 2-3 weeks for BP recheck  3.  CAD:  - known history.  No evidence of acute issues  now or recently. - stent placed in 2011 - not currently on beta blocker, HLD treatment, etc - should FU with cardiology in next 1-2 months to re-establish regular care, even if just once a year.  He himself reports he was "cleared" by cardiology and to only FU if necessary.   4.  Health maintenance:  - Can address other medical issues (DM2, etc) at next visit as well.

## 2016-12-10 ENCOUNTER — Other Ambulatory Visit: Payer: Self-pay | Admitting: Family Medicine

## 2016-12-12 ENCOUNTER — Encounter: Payer: Self-pay | Admitting: Family Medicine

## 2016-12-12 ENCOUNTER — Ambulatory Visit (INDEPENDENT_AMBULATORY_CARE_PROVIDER_SITE_OTHER): Payer: BLUE CROSS/BLUE SHIELD | Admitting: Family Medicine

## 2016-12-12 VITALS — BP 146/86 | HR 63 | Temp 97.7°F | Resp 18 | Ht 68.9 in | Wt 187.0 lb

## 2016-12-12 DIAGNOSIS — J45909 Unspecified asthma, uncomplicated: Secondary | ICD-10-CM | POA: Diagnosis not present

## 2016-12-12 DIAGNOSIS — E785 Hyperlipidemia, unspecified: Secondary | ICD-10-CM

## 2016-12-12 DIAGNOSIS — I10 Essential (primary) hypertension: Secondary | ICD-10-CM | POA: Diagnosis not present

## 2016-12-12 LAB — LIPID PANEL
CHOLESTEROL TOTAL: 170 mg/dL (ref 100–199)
Chol/HDL Ratio: 3.1 ratio (ref 0.0–5.0)
HDL: 55 mg/dL (ref >39)
LDL CALC: 99 mg/dL (ref 0–99)
TRIGLYCERIDES: 82 mg/dL (ref 0–149)
VLDL CHOLESTEROL CAL: 16 mg/dL (ref 5–40)

## 2016-12-12 LAB — COMPREHENSIVE METABOLIC PANEL
A/G RATIO: 2.4 — AB (ref 1.2–2.2)
ALT: 15 IU/L (ref 0–44)
AST: 12 IU/L (ref 0–40)
Albumin: 4.3 g/dL (ref 3.6–4.8)
Alkaline Phosphatase: 86 IU/L (ref 39–117)
BILIRUBIN TOTAL: 0.6 mg/dL (ref 0.0–1.2)
BUN/Creatinine Ratio: 15 (ref 10–24)
BUN: 17 mg/dL (ref 8–27)
CALCIUM: 9.5 mg/dL (ref 8.6–10.2)
CHLORIDE: 101 mmol/L (ref 96–106)
CO2: 24 mmol/L (ref 20–29)
Creatinine, Ser: 1.16 mg/dL (ref 0.76–1.27)
GFR calc Af Amer: 77 mL/min/{1.73_m2} (ref >59)
GFR, EST NON AFRICAN AMERICAN: 67 mL/min/{1.73_m2} (ref >59)
Globulin, Total: 1.8 g/dL (ref 1.5–4.5)
Glucose: 336 mg/dL — ABNORMAL HIGH (ref 65–99)
POTASSIUM: 5.1 mmol/L (ref 3.5–5.2)
Sodium: 140 mmol/L (ref 134–144)
Total Protein: 6.1 g/dL (ref 6.0–8.5)

## 2016-12-12 LAB — CBC
HEMOGLOBIN: 15.6 g/dL (ref 13.0–17.7)
Hematocrit: 48.6 % (ref 37.5–51.0)
MCH: 26.4 pg — ABNORMAL LOW (ref 26.6–33.0)
MCHC: 32.1 g/dL (ref 31.5–35.7)
MCV: 82 fL (ref 79–97)
Platelets: 148 10*3/uL — ABNORMAL LOW (ref 150–379)
RBC: 5.92 x10E6/uL — AB (ref 4.14–5.80)
RDW: 14.2 % (ref 12.3–15.4)
WBC: 5.6 10*3/uL (ref 3.4–10.8)

## 2016-12-12 MED ORDER — CARVEDILOL 25 MG PO TABS: ORAL_TABLET | ORAL | 2 refills | Status: DC

## 2016-12-12 MED ORDER — LISINOPRIL 40 MG PO TABS: 40.0000 mg | ORAL_TABLET | Freq: Every day | ORAL | 1 refills | Status: DC

## 2016-12-12 NOTE — Patient Instructions (Addendum)
  It was good to see you again today.  I have refilled your medicines for you.  Also checking some labs today. We'll let you these results next week.  Let us know if you have any questions or concerns.   IF you received an x-ray today, you will receive an invoice from [facility]. Please contact [facility] at [phone] with questions or concerns regarding your invoice.   IF you received labwork today, you will receive an invoice from [place]. Please contact [facility] at [phone] with questions or concerns regarding your invoice.   Our billing staff will not be able to assist you with questions regarding bills from these companies.  You will be contacted with the lab results as soon as they are available. The fastest way to get your results is to activate your My Chart account. Instructions are located on the last page of this paperwork. If you have not heard from [place] regarding the results in 2 weeks, please contact this office.

## 2016-12-12 NOTE — Progress Notes (Signed)
Scott Dodson is a 66 y.o. male who presents to Primary Care at [facility] today for FU for HTN and asthma exacerbation:  1.   Hypertension:  Long-term problem for this patient.  No adverse effects from medication.  Not checking it regularly.  No HA, CP, dizziness, shortness of breath, palpitations, or LE swelling.   BP Readings from Last 3 Encounters:  12/12/16 (!) 146/86  11/13/16 (!) 150/90  09/13/14 (!) 150/88   2.  Asthma exacerbation:  Much improved.  Last visit was mid-May.  States that he began having much improvement without need for daily inhaler about a week after office visit here. His no longer on the prednisone or azithromycin. He has to use his albuterol "occasionally" since then which means essentially once every 2-3 days if he has been outside most. No dyspnea on exertion. No chest pain on exertion. No lower extremity edema. No fevers or chills. He feels very well currently.  ROS as above.    PMH reviewed. Patient is a nonsmoker.   Past Medical History:  Diagnosis Date  . Allergy   . Asthma   . Coronary artery disease    s/p lateral MI 4 /11, s/p Promus DES OM2  . Heart murmur   . Hyperlipidemia   . Hypertension   . Myocardial infarct [facility])    Past Surgical History:  Procedure Laterality Date  . ADENOIDECTOMY    . CORONARY STENT PLACEMENT    . TONSILLECTOMY    . VASECTOMY      Medications reviewed. Current Outpatient Prescriptions  Medication Sig Dispense Refill  . albuterol (PROVENTIL HFA;VENTOLIN HFA) 108 (90 Base) MCG/ACT inhaler Inhale 2 puffs into the lungs every 4 (four) hours as needed for wheezing or shortness of breath (cough, shortness of breath or wheezing.). 1 Inhaler 1  . aspirin 81 MG tablet Take 81 mg by mouth daily.    . carvedilol (COREG) 25 MG tablet TAKE 1 TABLET (25 MG TOTAL) BY MOUTH 2 (TWO) TIMES DAILY WITH A MEAL. 60 tablet 0  . lisinopril (PRINIVIL,ZESTRIL) 40 MG tablet Take 1 tablet (40 mg total) by mouth daily. 30 tablet 0   No  current facility-administered medications for this visit.      Physical Exam:  BP (!) 146/86 (BP Location: Right Arm, Patient Position: Sitting, Cuff Size: Normal)   Pulse 63   Temp 97.7 F (36.5 C) (Oral)   Resp 18   Ht 5' 8.9" (1.75 m)   Wt 187 lb (84.8 kg)   SpO2 98%   BMI 27.70 kg/m  Gen:  Alert, cooperative patient who appears stated age in no acute distress.  Vital signs reviewed. HEENT: EOMI,  MMM Pulm:  Normal work of breathing. Good aeration throughout. Has very faint wheezes only at the bases bilaterally. Cardiac:  Regular rate and rhythm without murmur auscultated.  Good S1/S2. Abd:  Soft/nondistended/nontender.  Good bowel sounds throughout all four quadrants.  No masses noted.  Exts: Non edematous BL  LE, warm and well perfused.   Assessment and Plan:  1.  Hypertension:  -controlled at current medication regimen.  -we are checking creatinine as well as other basic labs today. - I have refilled his medicines today.  #2. Asthma: Non-currently controlled with just albuterol. -If he starts to have more regular exacerbations will need to be put on a controller.  3. Hyperlipidemia: -He is not taking a statin currently. -Based on his past medical history I think he should be on one. We did discuss  this. -He like to await results of his LDL. I'm assuming lipid panel will be elevated and we will restart his statin at that time.

## 2016-12-13 ENCOUNTER — Telehealth: Payer: Self-pay | Admitting: Family Medicine

## 2016-12-13 DIAGNOSIS — R739 Hyperglycemia, unspecified: Secondary | ICD-10-CM

## 2016-12-13 NOTE — Telephone Encounter (Signed)
Called and had to leave message about elevated blood sugars for patient.  Need to obtain A1C but patient qualifies for diabetes based on random elevated CBGs.  I did NOT leave this as message but will discuss with him after results of A1C have come back.  Looking through his chart, he's had elevated glucose on prior BMETs in the past as well.  Awaiting A1C.  Will need Metformin at the very least.

## 2016-12-16 NOTE — Telephone Encounter (Signed)
Please call to ensure patient received message about elevated glucose and need to come in for A1C.  Thanks!  JW

## 2016-12-17 NOTE — Telephone Encounter (Signed)
Left message to return call 

## 2016-12-17 NOTE — Telephone Encounter (Signed)
Left VM on Cell informing pt that he needs to come in to have A1c

## 2016-12-20 ENCOUNTER — Encounter: Payer: Self-pay | Admitting: Radiology

## 2016-12-20 NOTE — Telephone Encounter (Addendum)
Called, and left message on AM informing patient that he has to RTC to check  his A1c. "Unable to reach " letter has been sent.

## 2017-05-23 ENCOUNTER — Other Ambulatory Visit: Payer: Self-pay

## 2017-05-23 ENCOUNTER — Ambulatory Visit (INDEPENDENT_AMBULATORY_CARE_PROVIDER_SITE_OTHER): Payer: BLUE CROSS/BLUE SHIELD

## 2017-05-23 ENCOUNTER — Ambulatory Visit (HOSPITAL_COMMUNITY)
Admission: EM | Admit: 2017-05-23 | Discharge: 2017-05-23 | Disposition: A | Discharge status: Home / Self Care | Payer: BLUE CROSS/BLUE SHIELD | Attending: Emergency Medicine | Admitting: Emergency Medicine

## 2017-05-23 ENCOUNTER — Encounter (HOSPITAL_COMMUNITY): Payer: Self-pay | Admitting: Emergency Medicine

## 2017-05-23 DIAGNOSIS — J9801 Acute bronchospasm: Secondary | ICD-10-CM | POA: Diagnosis not present

## 2017-05-23 DIAGNOSIS — J069 Acute upper respiratory infection, unspecified: Secondary | ICD-10-CM | POA: Diagnosis not present

## 2017-05-23 DIAGNOSIS — B349 Viral infection, unspecified: Secondary | ICD-10-CM | POA: Diagnosis not present

## 2017-05-23 DIAGNOSIS — R062 Wheezing: Secondary | ICD-10-CM

## 2017-05-23 DIAGNOSIS — R05 Cough: Secondary | ICD-10-CM

## 2017-05-23 DIAGNOSIS — J029 Acute pharyngitis, unspecified: Secondary | ICD-10-CM

## 2017-05-23 MED ORDER — TRIAMCINOLONE ACETONIDE 40 MG/ML IJ SUSP
INTRAMUSCULAR | Status: AC
  Filled 2017-05-23: qty 1

## 2017-05-23 MED ORDER — PREDNISONE 50 MG PO TABS: ORAL_TABLET | ORAL | 0 refills | Status: DC

## 2017-05-23 MED ORDER — TRIAMCINOLONE ACETONIDE 40 MG/ML IJ SUSP
40.0000 mg | Freq: Once | INTRAMUSCULAR | Status: AC
  Administered 2017-05-23: 40 mg via INTRAMUSCULAR

## 2017-05-23 MED ORDER — IPRATROPIUM-ALBUTEROL 0.5-2.5 (3) MG/3ML IN SOLN
3.0000 mL | Freq: Once | RESPIRATORY_TRACT | Status: AC
  Administered 2017-05-23: 3 mL via RESPIRATORY_TRACT

## 2017-05-23 MED ORDER — IPRATROPIUM-ALBUTEROL 0.5-2.5 (3) MG/3ML IN SOLN
RESPIRATORY_TRACT | Status: AC
  Filled 2017-05-23: qty 3

## 2017-05-23 NOTE — Discharge Instructions (Signed)
Sudafed PE 10 mg every 4 to 6 hours as needed for congestion Allegra or Zyrtec daily as needed for drainage and runny nose. For stronger antihistamine may take Chlor-Trimeton 2 to 4 mg every 4 to 6 hours, may cause drowsiness.S Saline nasal spray used frequently. Drink plenty of fluids and stay well-hydrated. Albuterol 2 puffs every 4 hours as needed for cough and wheeze.

## 2017-05-23 NOTE — ED Provider Notes (Signed)
[place]    CSN: [id number] Arrival date & time: 05/23/17  1113     History   Chief Complaint Chief Complaint  Patient presents with  . Cough    HPI Scott Dodson is a 66 y.o. male.   66 year old male with a remote history of asthma and no smoking presents to the urgent care with complaints of night sweats last night, upper respiratory congestion, nasal congestion, cough. Denies earache or current sore throat.      Past Medical History:  Diagnosis Date  . Allergy   . Asthma   . Coronary artery disease    s/p lateral MI 4 /11, s/p Promus DES OM2  . Heart murmur   . Hyperlipidemia   . Hypertension   . Myocardial infarct [facility])     Patient Active Problem List   Diagnosis Date Noted  . CARDIOMYOPATHY, ISCHEMIC 05/18/2010  . CORONARY ARTERY DISEASE 10/11/2009  . DIABETES MELLITUS, TYPE II 10/09/2009  . HYPERLIPIDEMIA 10/09/2009  . HYPERTENSION 10/09/2009  . MYOCARDIAL INFARCTION, HX OF 10/09/2009    Past Surgical History:  Procedure Laterality Date  . ADENOIDECTOMY    . CORONARY STENT PLACEMENT    . TONSILLECTOMY    . VASECTOMY         Home Medications    Prior to Admission medications   Medication Sig Start Date End Date Taking? Authorizing Provider  albuterol (PROVENTIL HFA;VENTOLIN HFA) 108 (90 Base) MCG/ACT inhaler Inhale 2 puffs into the lungs every 4 (four) hours as needed for wheezing or shortness of breath (cough, shortness of breath or wheezing.). 11/13/16  Yes Alveda Reasons, MD  aspirin 81 MG tablet Take 81 mg by mouth daily.   Yes [provider]  carvedilol (COREG) 25 MG tablet TAKE 1 TABLET (25 MG TOTAL) BY MOUTH 2 (TWO) TIMES DAILY WITH A MEAL. 12/12/16  Yes Alveda Reasons, MD  lisinopril (PRINIVIL,ZESTRIL) 40 MG tablet Take 1 tablet (40 mg total) by mouth daily. 12/12/16  Yes Alveda Reasons, MD  predniSONE (DELTASONE) 50 MG tablet 1 tab po daily for 6 days. Take with food. Start 05/24/17. 05/23/17   Janne Napoleon,  NP    Family History Family History  Problem Relation Age of Onset  . Heart disease Mother   . Hypertension Mother   . Heart disease Father   . Hypertension Father   . Heart disease Brother   . Heart disease Sister     Social History Social History   Tobacco Use  . Smoking status: Former Research scientist (life sciences)  . Smokeless tobacco: Never Used  Substance Use Topics  . Alcohol use: Yes  . Drug use: No     Allergies   Patient has no known allergies.   Review of Systems Review of Systems  Constitutional: Positive for activity change and fever. Negative for diaphoresis and fatigue.  HENT: Positive for congestion, postnasal drip and sore throat. Negative for ear pain, facial swelling, rhinorrhea and trouble swallowing.   Eyes: Negative for pain, discharge and redness.  Respiratory: Positive for cough and wheezing. Negative for chest tightness and shortness of breath.   Cardiovascular: Negative.   Gastrointestinal: Negative.   Musculoskeletal: Negative.  Negative for neck pain and neck stiffness.  Neurological: Negative.   All other systems reviewed and are negative.    Physical Exam Triage Vital Signs ED Triage Vitals  Enc Vitals Group     BP 05/23/17 1158 (!) 169/85     Pulse Rate 05/23/17 1158 75  Resp 05/23/17 1158 20     Temp 05/23/17 1158 99.5 F (37.5 C)     Temp src --      SpO2 05/23/17 1158 95 %     Weight --      Height --      Head Circumference --      Peak Flow --      Pain Score 05/23/17 1200 7     Pain Loc --      Pain Edu? --      Excl. in [place]? --    No data found.  Updated Vital Signs BP (!) 169/85   Pulse 75   Temp 99.5 F (37.5 C)   Resp 20   SpO2 95%   Visual Acuity Right Eye Distance:   Left Eye Distance:   Bilateral Distance:    Right Eye Near:   Left Eye Near:    Bilateral Near:     Physical Exam  Constitutional: He is oriented to person, place, and time. He appears well-developed and well-nourished. No distress.  HENT:    Bilateral TMs are normal. Oropharynx with streaky erythema and light clear PND. No exudate  Neck: Normal range of motion. Neck supple.  Cardiovascular: Normal rate, regular rhythm and normal heart sounds.  Pulmonary/Chest: Effort normal. No respiratory distress. He has no rales.  Bilateral wheezing and light coarseness. Deep inspiration produces paroxysmal coughing.  Musculoskeletal: Normal range of motion. He exhibits no edema.  Lymphadenopathy:    He has no cervical adenopathy.  Neurological: He is alert and oriented to person, place, and time.  Skin: Skin is warm and dry. No rash noted.  Psychiatric: He has a normal mood and affect.  Nursing note and vitals reviewed.    UC Treatments / Results  Labs (all labs ordered are listed, but only abnormal results are displayed) Labs Reviewed - No data to display  EKG  EKG Interpretation None     Post 1 AB patient states he is breathing much better. Auscultation reveals no wheezes and good air movement.  Radiology Dg Chest 2 View  Result Date: 05/23/2017 CLINICAL DATA:  Congestion and cough for 3 days, intermittent fever, body aches and sore throat. History of pneumonia 2 years ago. EXAM: CHEST  2 VIEW COMPARISON:  Chest x-rays dated 09/13/2014 and 07/25/2013. FINDINGS: Heart size and mediastinal contours are stable. Lungs are clear. No pleural effusion or pneumothorax seen. Osseous structures about the chest are unremarkable. IMPRESSION: No active cardiopulmonary disease. No evidence of pneumonia or pulmonary edema. Electronically Signed   By: Franki Cabot M.D.   On: 05/23/2017 13:20    Procedures Procedures (including critical care time)  Medications Ordered in UC Medications  ipratropium-albuterol (DUONEB) 0.5-2.5 (3) MG/3ML nebulizer solution 3 mL (3 mLs Nebulization Given 05/23/17 1301)  triamcinolone acetonide (KENALOG-40) injection 40 mg (40 mg Intramuscular Given 05/23/17 1300)     Initial Impression / Assessment and  Plan / UC Course  I have reviewed the triage vital signs and the nursing notes.  Pertinent labs & imaging results that were available during my care of the patient were reviewed by me and considered in my medical decision making (see chart for details).    Sudafed PE 10 mg every 4 to 6 hours as needed for congestion Allegra or Zyrtec daily as needed for drainage and runny nose. For stronger antihistamine may take Chlor-Trimeton 2 to 4 mg every 4 to 6 hours, may cause drowsiness.S Saline nasal spray used frequently. Drink plenty  of fluids and stay well-hydrated. Albuterol 2 puffs every 4 hours as needed for cough and wheeze.     Final Clinical Impressions(s) / UC Diagnoses   Final diagnoses:  Viral upper respiratory tract infection  Cough due to bronchospasm    ED Discharge Orders        Ordered    predniSONE (DELTASONE) 50 MG tablet     05/23/17 1329       Controlled Substance Prescriptions [place] Controlled Substance Registry consulted? Not Applicable   Janne Napoleon, NP 05/23/17 1336

## 2017-05-23 NOTE — ED Triage Notes (Signed)
Pt c/o cold symptoms, chest congestion. Woke up last night with sweats. Body aches. Flu like symptoms.

## 2017-05-23 NOTE — ED Notes (Signed)
Went to get patient for CXR, patient receiving breathing treatment

## 2017-10-13 ENCOUNTER — Encounter (HOSPITAL_COMMUNITY): Payer: Self-pay | Admitting: Family Medicine

## 2017-10-13 ENCOUNTER — Ambulatory Visit (INDEPENDENT_AMBULATORY_CARE_PROVIDER_SITE_OTHER): Payer: BLUE CROSS/BLUE SHIELD

## 2017-10-13 ENCOUNTER — Ambulatory Visit (HOSPITAL_COMMUNITY)
Admission: EM | Admit: 2017-10-13 | Discharge: 2017-10-13 | Disposition: A | Discharge status: Home / Self Care | Payer: BLUE CROSS/BLUE SHIELD | Attending: Urgent Care | Admitting: Urgent Care

## 2017-10-13 DIAGNOSIS — R059 Cough, unspecified: Secondary | ICD-10-CM

## 2017-10-13 DIAGNOSIS — I519 Heart disease, unspecified: Secondary | ICD-10-CM

## 2017-10-13 DIAGNOSIS — J069 Acute upper respiratory infection, unspecified: Secondary | ICD-10-CM | POA: Insufficient documentation

## 2017-10-13 DIAGNOSIS — I493 Ventricular premature depolarization: Secondary | ICD-10-CM

## 2017-10-13 DIAGNOSIS — R05 Cough: Secondary | ICD-10-CM | POA: Diagnosis present

## 2017-10-13 DIAGNOSIS — J45909 Unspecified asthma, uncomplicated: Secondary | ICD-10-CM | POA: Insufficient documentation

## 2017-10-13 DIAGNOSIS — E1165 Type 2 diabetes mellitus with hyperglycemia: Secondary | ICD-10-CM

## 2017-10-13 DIAGNOSIS — Z955 Presence of coronary angioplasty implant and graft: Secondary | ICD-10-CM | POA: Insufficient documentation

## 2017-10-13 DIAGNOSIS — J4530 Mild persistent asthma, uncomplicated: Secondary | ICD-10-CM

## 2017-10-13 DIAGNOSIS — R030 Elevated blood-pressure reading, without diagnosis of hypertension: Secondary | ICD-10-CM | POA: Diagnosis not present

## 2017-10-13 DIAGNOSIS — I10 Essential (primary) hypertension: Secondary | ICD-10-CM | POA: Diagnosis not present

## 2017-10-13 DIAGNOSIS — R0600 Dyspnea, unspecified: Secondary | ICD-10-CM

## 2017-10-13 DIAGNOSIS — I252 Old myocardial infarction: Secondary | ICD-10-CM | POA: Insufficient documentation

## 2017-10-13 DIAGNOSIS — R9431 Abnormal electrocardiogram [ECG] [EKG]: Secondary | ICD-10-CM

## 2017-10-13 LAB — POCT I-STAT, CHEM 8
BUN: 9 mg/dL (ref 6–20)
CHLORIDE: 101 mmol/L (ref 101–111)
CREATININE: 0.9 mg/dL (ref 0.61–1.24)
Calcium, Ion: 1.14 mmol/L — ABNORMAL LOW (ref 1.15–1.40)
Glucose, Bld: 236 mg/dL — ABNORMAL HIGH (ref 65–99)
HCT: 44 % (ref 39.0–52.0)
HEMOGLOBIN: 15 g/dL (ref 13.0–17.0)
POTASSIUM: 4.3 mmol/L (ref 3.5–5.1)
Sodium: 136 mmol/L (ref 135–145)
TCO2: 23 mmol/L (ref 22–32)

## 2017-10-13 LAB — BRAIN NATRIURETIC PEPTIDE: B Natriuretic Peptide: 509.5 pg/mL — ABNORMAL HIGH (ref 0.0–100.0)

## 2017-10-13 MED ORDER — CETIRIZINE HCL 10 MG PO TABS: 10.0000 mg | ORAL_TABLET | Freq: Every day | ORAL | 1 refills | Status: DC

## 2017-10-13 MED ORDER — FLUTICASONE PROPIONATE 50 MCG/ACT NA SUSP: 2.0000 | Freq: Every day | NASAL | 12 refills | Status: DC

## 2017-10-13 MED ORDER — MONTELUKAST SODIUM 10 MG PO TABS: 10.0000 mg | ORAL_TABLET | Freq: Every day | ORAL | 3 refills | Status: DC

## 2017-10-13 MED ORDER — AMLODIPINE BESYLATE 5 MG PO TABS: 5.0000 mg | ORAL_TABLET | Freq: Every day | ORAL | 0 refills | Status: DC

## 2017-10-13 MED ORDER — BENZONATATE 100 MG PO CAPS: 100.0000 mg | ORAL_CAPSULE | Freq: Three times a day (TID) | ORAL | 0 refills | Status: DC | PRN

## 2017-10-13 MED ORDER — CARVEDILOL 25 MG PO TABS: ORAL_TABLET | ORAL | 1 refills | Status: DC

## 2017-10-13 MED ORDER — ALBUTEROL SULFATE HFA 108 (90 BASE) MCG/ACT IN AERS: 2.0000 | INHALATION_SPRAY | Freq: Four times a day (QID) | RESPIRATORY_TRACT | 1 refills | Status: DC | PRN

## 2017-10-13 NOTE — ED Provider Notes (Signed)
MRN: [id number] DOB: [birth year]  Subjective:   Scott Dodson is a 67 y.o. male past medical history of MI, hypertension, asthma presenting for 2-week history of persistent productive cough, shortness of breath.  Patient has been using his albuterol inhaler multiple times throughout the day, sometimes once every 2 hours.  He is also been trying Robitussin.  Has a history of allergies and is managing this with Benadryl daily.  Denies headaches, dizziness, sinus pain, sore throat, chest pain, heart racing, palpitations, nausea, vomiting, abdominal pain.  Patient had an MI about 10 years ago and is status post stent placement.  He has not followed up with his cardiologist in several years, states that he was doing very well and that his heart doctor told him to follow-up as needed.  He does not have a PCP right now but is still taking his lisinopril for his high blood pressure.  He has run out of his carvedilol.  Still takes his aspirin daily.    No Known Allergies   Past Medical History:  Diagnosis Date  . Allergy   . Asthma   . Coronary artery disease    s/p lateral MI 4 /11, s/p Promus DES OM2  . Heart murmur   . Hyperlipidemia   . Hypertension   . Myocardial infarct [facility])      Past Surgical History:  Procedure Laterality Date  . ADENOIDECTOMY    . CORONARY STENT PLACEMENT    . TONSILLECTOMY    . VASECTOMY      Objective:   Vitals: BP (!) 186/106 (BP Location: Right Arm)   Pulse 82   Temp 98.8 F (37.1 C) (Oral)   Resp 18   SpO2 95%   BP Readings from Last 3 Encounters:  10/13/17 (!) 186/106  05/23/17 (!) 169/85  12/12/16 (!) 146/86    Physical Exam  Constitutional: He is oriented to person, place, and time. He appears well-developed and well-nourished.  HENT:  Significant streaks of postnasal drainage over his oropharynx.  No sinus tenderness.  Eyes: Right eye exhibits no discharge. Left eye exhibits no discharge.  Cardiovascular: Normal rate, regular rhythm and  intact distal pulses. Exam reveals no gallop and no friction rub.  No murmur heard. Pulmonary/Chest: No stridor. No respiratory distress. He has no wheezes. He has no rales.  Diminished lung sounds throughout, coarse lung sounds over right mid to lower lung fields.  Neurological: He is alert and oriented to person, place, and time.  Skin: Skin is warm and dry.  Psychiatric: He has a normal mood and affect.   Dg Chest 2 View  Result Date: 10/13/2017 CLINICAL DATA:  Chest congestion and shortness of breath. EXAM: CHEST - 2 VIEW COMPARISON:  May 23, 2017 FINDINGS: Lungs are clear. Heart size and pulmonary vascularity are normal. No adenopathy. No pneumothorax. No bone lesions. IMPRESSION: No edema or consolidation. Electronically Signed   By: Lowella Grip III M.D.   On: 10/13/2017 13:48     Results for orders placed or performed during the hospital encounter of 10/13/17 (from the past 24 hour(s))  I-STAT, chem 8     Status: Abnormal   Collection Time: 10/13/17  2:23 PM  Result Value Ref Range   Sodium 136 135 - 145 mmol/L   Potassium 4.3 3.5 - 5.1 mmol/L   Chloride 101 101 - 111 mmol/L   BUN 9 6 - 20 mg/dL   Creatinine, Ser 0.90 0.61 - 1.24 mg/dL   Glucose, Bld 236 (H) 65 - 99  mg/dL   Calcium, Ion 1.14 (L) 1.15 - 1.40 mmol/L   TCO2 23 22 - 32 mmol/L   Hemoglobin 15.0 13.0 - 17.0 g/dL   HCT 44.0 39.0 - 52.0 %     ED ECG REPORT   Date: 10/13/2017  Rate: 85bpm  Rhythm: premature ventricular contractions (PVC)  QRS Axis: normal  Intervals: normal  ST/T Wave abnormalities: nonspecific T wave changes  Conduction Disutrbances:none  Narrative Interpretation: PVCs noted which is new compared to ECG from 11/23/2016 but otherwise no acute findings warranting ER consult.  Old EKG Reviewed: changes noted  I have personally reviewed the EKG tracing and agree with the computerized printout as noted.    Assessment and Plan :   Cough - Plan: albuterol (PROVENTIL HFA;VENTOLIN HFA)  108 (90 Base) MCG/ACT inhaler  Dyspnea, unspecified type  Heart disease  History of MI (myocardial infarction)  History of coronary artery stent placement  Essential hypertension  Elevated blood pressure reading  Mild persistent extrinsic asthma without complication  Acute upper respiratory infection - Plan: albuterol (PROVENTIL HFA;VENTOLIN HFA) 108 (90 Base) MCG/ACT inhaler  Nonspecific abnormal electrocardiogram (ECG) (EKG)  PVC's (premature ventricular contractions)  Uncontrolled type 2 diabetes mellitus with hyperglycemia (HCC)  Case precepted with Dr. Sabra Heck and Dr. Joseph Art. Patient was restarted on his carvedilol.  Counseled on appropriate use of albuterol inhaler.  Patient is to start allergy medications.  I emphasized the need for him to check back with his cardiologist given the new PVCs he is having.  Chest x-ray is reassuring against cardiomegaly, pulmonary edema or vascular congestion.  BNP level is elevated.  We will maintain our plan to have patient see his cardiologist as soon as possible.     Jaynee Eagles, PA-C 10/13/17 1705

## 2017-10-13 NOTE — ED Triage Notes (Signed)
Pt here for 2 weeks of cough and SOB. Reports SOS with exertion. Reports some night sweats.

## 2017-10-13 NOTE — Discharge Instructions (Signed)
Please make sure you see your cardiologist for follow up.

## 2017-11-04 ENCOUNTER — Other Ambulatory Visit: Payer: Self-pay | Admitting: Urgent Care

## 2017-11-30 ENCOUNTER — Other Ambulatory Visit: Payer: Self-pay | Admitting: Urgent Care

## 2018-01-08 ENCOUNTER — Other Ambulatory Visit: Payer: Self-pay | Admitting: Urgent Care

## 2018-02-05 ENCOUNTER — Other Ambulatory Visit: Payer: Self-pay | Admitting: Urgent Care

## 2018-02-09 ENCOUNTER — Ambulatory Visit: Payer: Medicare Other | Admitting: Emergency Medicine

## 2018-02-09 ENCOUNTER — Other Ambulatory Visit: Payer: Self-pay

## 2018-02-09 ENCOUNTER — Encounter: Payer: Self-pay | Admitting: Emergency Medicine

## 2018-02-09 VITALS — BP 156/84 | HR 59 | Temp 97.6°F | Resp 16 | Ht 69.25 in | Wt 188.2 lb

## 2018-02-09 DIAGNOSIS — Z1211 Encounter for screening for malignant neoplasm of colon: Secondary | ICD-10-CM

## 2018-02-09 DIAGNOSIS — Z8639 Personal history of other endocrine, nutritional and metabolic disease: Secondary | ICD-10-CM

## 2018-02-09 DIAGNOSIS — I10 Essential (primary) hypertension: Secondary | ICD-10-CM | POA: Diagnosis not present

## 2018-02-09 DIAGNOSIS — Z114 Encounter for screening for human immunodeficiency virus [HIV]: Secondary | ICD-10-CM | POA: Insufficient documentation

## 2018-02-09 DIAGNOSIS — M25551 Pain in right hip: Secondary | ICD-10-CM | POA: Diagnosis not present

## 2018-02-09 DIAGNOSIS — Z1159 Encounter for screening for other viral diseases: Secondary | ICD-10-CM | POA: Insufficient documentation

## 2018-02-09 DIAGNOSIS — Z23 Encounter for immunization: Secondary | ICD-10-CM

## 2018-02-09 MED ORDER — LISINOPRIL 40 MG PO TABS: 40.0000 mg | ORAL_TABLET | Freq: Every day | ORAL | 3 refills | Status: DC

## 2018-02-09 MED ORDER — AMLODIPINE BESYLATE 5 MG PO TABS: ORAL_TABLET | ORAL | 3 refills | Status: DC

## 2018-02-09 NOTE — Patient Instructions (Addendum)
   IF you received an x-ray today, you will receive an invoice from [facility]. Please contact  Radiology at [phone] with questions or concerns regarding your invoice.   IF you received labwork today, you will receive an invoice from [facility]. Please contact [facility] at [phone] with questions or concerns regarding your invoice.   Our billing staff will not be able to assist you with questions regarding bills from these companies.  You will be contacted with the lab results as soon as they are available. The fastest way to get your results is to activate your My Chart account. Instructions are located on the last page of this paperwork. If you have not heard from us regarding the results in 2 weeks, please contact this office.     Hypertension Hypertension is another name for high blood pressure. High blood pressure forces your heart to work harder to pump blood. This can cause problems over time. There are two numbers in a blood pressure reading. There is a top number (systolic) over a bottom number (diastolic). It is best to have a blood pressure below 120/80. Healthy choices can help lower your blood pressure. You may need medicine to help lower your blood pressure if:  Your blood pressure cannot be lowered with healthy choices.  Your blood pressure is higher than 130/80.  Follow these instructions at home: Eating and drinking  If directed, follow the DASH eating plan. This diet includes: ? Filling half of your plate at each meal with fruits and vegetables. ? Filling one quarter of your plate at each meal with whole grains. Whole grains include whole wheat pasta, brown rice, and whole grain bread. ? Eating or drinking low-fat dairy products, such as skim milk or low-fat yogurt. ? Filling one quarter of your plate at each meal with low-fat (lean) proteins. Low-fat proteins include fish, skinless chicken, eggs, beans, and tofu. ? Avoiding fatty meat, cured  and processed meat, or chicken with skin. ? Avoiding premade or processed food.  Eat less than 1,500 mg of salt (sodium) a day.  Limit alcohol use to no more than 1 drink a day for nonpregnant women and 2 drinks a day for men. One drink equals 12 oz of beer, 5 oz of wine, or 1 oz of hard liquor. Lifestyle  Work with your doctor to stay at a healthy weight or to lose weight. Ask your doctor what the best weight is for you.  Get at least 30 minutes of exercise that causes your heart to beat faster (aerobic exercise) most days of the week. This may include walking, swimming, or biking.  Get at least 30 minutes of exercise that strengthens your muscles (resistance exercise) at least 3 days a week. This may include lifting weights or pilates.  Do not use any products that contain nicotine or tobacco. This includes cigarettes and e-cigarettes. If you need help quitting, ask your doctor.  Check your blood pressure at home as told by your doctor.  Keep all follow-up visits as told by your doctor. This is important. Medicines  Take over-the-counter and prescription medicines only as told by your doctor. Follow directions carefully.  Do not skip doses of blood pressure medicine. The medicine does not work as well if you skip doses. Skipping doses also puts you at risk for problems.  Ask your doctor about side effects or reactions to medicines that you should watch for. Contact a doctor if:  You think you are having a reaction to the   medicine you are taking.  You have headaches that keep coming back (recurring).  You feel dizzy.  You have swelling in your ankles.  You have trouble with your vision. Get help right away if:  You get a very bad headache.  You start to feel confused.  You feel weak or numb.  You feel faint.  You get very bad pain in your: ? Chest. ? Belly (abdomen).  You throw up (vomit) more than once.  You have trouble breathing. Summary  Hypertension is  another name for high blood pressure.  Making healthy choices can help lower blood pressure. If your blood pressure cannot be controlled with healthy choices, you may need to take medicine. This information is not intended to replace advice given to you by your health care provider. Make sure you discuss any questions you have with your health care provider. Document Released: 12/04/2007 Document Revised: 05/15/2016 Document Reviewed: 05/15/2016 Elsevier Interactive Patient Education  2018 [employer]  

## 2018-02-09 NOTE — Progress Notes (Signed)
Scott Dodson 67 y.o.   Chief Complaint  Patient presents with  . new pt    estab care  . Hip    right, " If I stand for a long period of time it will ache  . Hypertension  . Medication Refill    Amlodipine Besylate 5 mg, Lisinopril 40 mg    HISTORY OF PRESENT ILLNESS: This is a 67 y.o. male with history of hypertension here for follow-up and medication refill. Also complaining of intermittent pain to the right hip for the past 6 months since an injury while lifting and moving heavy boxes. Otherwise he has no complaints. Has a history of diabetes but no medications right now.  Has taken metformin in the past but prefers to control it with diet and exercise. HPI   Prior to Admission medications   Medication Sig Start Date End Date Taking? Authorizing Provider  albuterol (PROVENTIL HFA;VENTOLIN HFA) 108 (90 Base) MCG/ACT inhaler Inhale 2 puffs into the lungs every 6 (six) hours as needed for wheezing or shortness of breath (cough, shortness of breath or wheezing.). 10/13/17  Yes Jaynee Eagles, PA-C  amLODipine (NORVASC) 5 MG tablet TAKE 1 TABLET BY MOUTH EVERY DAY *NEEDS APPT FOR REFILLS 02/05/18  Yes Jaynee Eagles, PA-C  aspirin 81 MG tablet Take 81 mg by mouth daily.   Yes [provider]  carvedilol (COREG) 25 MG tablet TAKE 1 TABLET (25 MG TOTAL) BY MOUTH 2 (TWO) TIMES DAILY WITH A MEAL. 11/04/17  Yes Jaynee Eagles, PA-C  cetirizine (ZYRTEC ALLERGY) 10 MG tablet Take 1 tablet (10 mg total) by mouth daily. 10/13/17  Yes Jaynee Eagles, PA-C  lisinopril (PRINIVIL,ZESTRIL) 40 MG tablet Take 1 tablet (40 mg total) by mouth daily. 12/12/16  Yes Alveda Reasons, MD  fluticasone (FLONASE) 50 MCG/ACT nasal spray Place 2 sprays into both nostrils daily. 10/13/17   Jaynee Eagles, PA-C  montelukast (SINGULAIR) 10 MG tablet Take 1 tablet (10 mg total) by mouth at bedtime. 10/13/17   Jaynee Eagles, PA-C    No Known Allergies  Patient Active Problem List   Diagnosis Date Noted  . CARDIOMYOPATHY,  ISCHEMIC 05/18/2010  . CORONARY ARTERY DISEASE 10/11/2009  . DIABETES MELLITUS, TYPE II 10/09/2009  . HYPERLIPIDEMIA 10/09/2009  . HYPERTENSION 10/09/2009  . MYOCARDIAL INFARCTION, HX OF 10/09/2009    Past Medical History:  Diagnosis Date  . Allergy   . Asthma   . Coronary artery disease    s/p lateral MI 4 /11, s/p Promus DES OM2  . Heart murmur   . Hyperlipidemia   . Hypertension   . Myocardial infarct [facility])     Past Surgical History:  Procedure Laterality Date  . ADENOIDECTOMY    . CORONARY STENT PLACEMENT    . TONSILLECTOMY    . VASECTOMY      Social History   Socioeconomic History  . Marital status: Married    Spouse name: Not on file  . Number of children: Not on file  . Years of education: Not on file  . Highest education level: Not on file  Occupational History  . Occupation: Charity fundraiser  Social Needs  . Financial resource strain: Not on file  . Food insecurity:    Worry: Not on file    Inability: Not on file  . Transportation needs:    Medical: Not on file    Non-medical: Not on file  Tobacco Use  . Smoking status: Former Research scientist (life sciences)  . Smokeless tobacco: Never Used  Substance and Sexual Activity  . Alcohol use: Yes  . Drug use: No  . Sexual activity: Not on file  Lifestyle  . Physical activity:    Days per week: Not on file    Minutes per session: Not on file  . Stress: Not on file  Relationships  . Social connections:    Talks on phone: Not on file    Gets together: Not on file    Attends religious service: Not on file    Active member of club or organization: Not on file    Attends meetings of clubs or organizations: Not on file    Relationship status: Not on file  . Intimate partner violence:    Fear of current or ex partner: Not on file    Emotionally abused: Not on file    Physically abused: Not on file    Forced sexual activity: Not on file  Other Topics Concern  . Not on file  Social History Narrative   Occupation-  Photographer    Former -tobacco -stopped 1979   Married ,3 children     Alcohol use - yes , social    Drug use - no   Regular exercise- yes     Family History  Problem Relation Age of Onset  . Heart disease Mother   . Hypertension Mother   . Heart disease Father   . Hypertension Father   . Heart disease Brother   . Heart disease Sister      Review of Systems  Constitutional: Negative.  Negative for chills, fever and malaise/fatigue.  HENT: Negative.  Negative for congestion, hearing loss, nosebleeds and sore throat.   Eyes: Negative.  Negative for blurred vision.  Respiratory: Negative.  Negative for cough and shortness of breath.   Cardiovascular: Negative.  Negative for chest pain and palpitations.  Gastrointestinal: Negative.  Negative for abdominal pain, diarrhea, nausea and vomiting.  Genitourinary: Negative.  Negative for dysuria and hematuria.  Musculoskeletal: Positive for joint pain (right hip).  Skin: Negative.  Negative for rash.  Neurological: Negative.  Negative for dizziness, sensory change, speech change, focal weakness and headaches.  Endo/Heme/Allergies: Negative.   All other systems reviewed and are negative.     Vitals:   02/09/18 0824  BP: (!) 156/84  Pulse: (!) 59  Resp: 16  Temp: 97.6 F (36.4 C)  SpO2: 97%    Physical Exam  Constitutional: He is oriented to person, place, and time. He appears well-developed and well-nourished.  HENT:  Head: Normocephalic and atraumatic.  Nose: Nose normal.  Mouth/Throat: Oropharynx is clear and moist.  Eyes: Pupils are equal, round, and reactive to light. Conjunctivae and EOM are normal.  Neck: Normal range of motion. Neck supple. No thyromegaly present.  Cardiovascular: Normal rate, regular rhythm and normal heart sounds.  Pulmonary/Chest: Effort normal and breath sounds normal.  Abdominal: Soft. Bowel sounds are normal. He exhibits no distension. There is no tenderness.  Musculoskeletal: He  exhibits no edema or tenderness.  Right hip: No tenderness or swelling.  No erythema.  Full range of motion.  Ambulates well.  Lymphadenopathy:    He has no cervical adenopathy.  Neurological: He is alert and oriented to person, place, and time. No sensory deficit. He exhibits normal muscle tone.  Skin: Skin is warm and dry. Capillary refill takes less than 2 seconds. No rash noted.  Psychiatric: He has a normal mood and affect. His behavior is normal.  Vitals reviewed.  ASSESSMENT & PLAN: Lorene was seen today for new pt, hip, hypertension and medication refill.  Diagnoses and all orders for this visit:  Essential hypertension -     lisinopril (PRINIVIL,ZESTRIL) 40 MG tablet; Take 1 tablet (40 mg total) by mouth daily. -     amLODipine (NORVASC) 5 MG tablet; TAKE 1 TABLET BY MOUTH EVERY DAY -     Comprehensive metabolic panel -     Lipid panel  History of diabetes mellitus -     Hemoglobin A1c -     Ambulatory referral to Ophthalmology -     HM Diabetes Foot Exam  Need for hepatitis C screening test -     Hepatitis C antibody  Colon cancer screening -     Ambulatory referral to Gastroenterology  Screening for HIV (human immunodeficiency virus) -     HIV antibody  Need for shingles vaccine -     Varicella-zoster vaccine IM (Shingrix)  Right hip pain    Patient Instructions       IF you received an x-ray today, you will receive an invoice from [facility]. Please contact [facility] at [phone] with questions or concerns regarding your invoice.   IF you received labwork today, you will receive an invoice from [facility]. Please contact [facility] at [phone] with questions or concerns regarding your invoice.   Our billing staff will not be able to assist you with questions regarding bills from these companies.  You will be contacted with the lab results as soon as they are available. The fastest way to get your results is to activate your  My Chart account. Instructions are located on the last page of this paperwork. If you have not heard from [place] regarding the results in 2 weeks, please contact this office.      Hypertension Hypertension is another name for high blood pressure. High blood pressure forces your heart to work harder to pump blood. This can cause problems over time. There are two numbers in a blood pressure reading. There is a top number (systolic) over a bottom number (diastolic). It is best to have a blood pressure below 120/80. Healthy choices can help lower your blood pressure. You may need medicine to help lower your blood pressure if:  Your blood pressure cannot be lowered with healthy choices.  Your blood pressure is higher than 130/80.  Follow these instructions at home: Eating and drinking  If directed, follow the DASH eating plan. This diet includes: ? Filling half of your plate at each meal with fruits and vegetables. ? Filling one quarter of your plate at each meal with whole grains. Whole grains include whole wheat pasta, brown rice, and whole grain bread. ? Eating or drinking low-fat dairy products, such as skim milk or low-fat yogurt. ? Filling one quarter of your plate at each meal with low-fat (lean) proteins. Low-fat proteins include fish, skinless chicken, eggs, beans, and tofu. ? Avoiding fatty meat, cured and processed meat, or chicken with skin. ? Avoiding premade or processed food.  Eat less than 1,500 mg of salt (sodium) a day.  Limit alcohol use to no more than 1 drink a day for nonpregnant women and 2 drinks a day for men. One drink equals 12 oz of beer, 5 oz of wine, or 1 oz of hard liquor. Lifestyle  Work with your doctor to stay at a healthy weight or to lose weight. Ask your doctor what the best weight is for you.  Get at least  30 minutes of exercise that causes your heart to beat faster (aerobic exercise) most days of the week. This may include walking, swimming, or  biking.  Get at least 30 minutes of exercise that strengthens your muscles (resistance exercise) at least 3 days a week. This may include lifting weights or pilates.  Do not use any products that contain nicotine or tobacco. This includes cigarettes and e-cigarettes. If you need help quitting, ask your doctor.  Check your blood pressure at home as told by your doctor.  Keep all follow-up visits as told by your doctor. This is important. Medicines  Take over-the-counter and prescription medicines only as told by your doctor. Follow directions carefully.  Do not skip doses of blood pressure medicine. The medicine does not work as well if you skip doses. Skipping doses also puts you at risk for problems.  Ask your doctor about side effects or reactions to medicines that you should watch for. Contact a doctor if:  You think you are having a reaction to the medicine you are taking.  You have headaches that keep coming back (recurring).  You feel dizzy.  You have swelling in your ankles.  You have trouble with your vision. Get help right away if:  You get a very bad headache.  You start to feel confused.  You feel weak or numb.  You feel faint.  You get very bad pain in your: ? Chest. ? Belly (abdomen).  You throw up (vomit) more than once.  You have trouble breathing. Summary  Hypertension is another name for high blood pressure.  Making healthy choices can help lower blood pressure. If your blood pressure cannot be controlled with healthy choices, you may need to take medicine. This information is not intended to replace advice given to you by your health care provider. Make sure you discuss any questions you have with your health care provider. Document Released: 12/04/2007 Document Revised: 05/15/2016 Document Reviewed: 05/15/2016 Elsevier Interactive Patient Education  2018 Elsevier Inc.      Agustina Caroli, MD [facility]

## 2018-02-10 LAB — COMPREHENSIVE METABOLIC PANEL
A/G RATIO: 1.9 (ref 1.2–2.2)
ALBUMIN: 4.3 g/dL (ref 3.6–4.8)
ALT: 15 IU/L (ref 0–44)
AST: 16 IU/L (ref 0–40)
Alkaline Phosphatase: 76 IU/L (ref 39–117)
BUN / CREAT RATIO: 17 (ref 10–24)
BUN: 18 mg/dL (ref 8–27)
Bilirubin Total: 0.5 mg/dL (ref 0.0–1.2)
CALCIUM: 9.1 mg/dL (ref 8.6–10.2)
CO2: 22 mmol/L (ref 20–29)
CREATININE: 1.08 mg/dL (ref 0.76–1.27)
Chloride: 100 mmol/L (ref 96–106)
GFR calc non Af Amer: 72 mL/min/{1.73_m2} (ref >59)
GFR, EST AFRICAN AMERICAN: 83 mL/min/{1.73_m2} (ref >59)
GLOBULIN, TOTAL: 2.3 g/dL (ref 1.5–4.5)
Glucose: 266 mg/dL — ABNORMAL HIGH (ref 65–99)
POTASSIUM: 4.4 mmol/L (ref 3.5–5.2)
SODIUM: 136 mmol/L (ref 134–144)
TOTAL PROTEIN: 6.6 g/dL (ref 6.0–8.5)

## 2018-02-10 LAB — HEMOGLOBIN A1C
Est. average glucose Bld gHb Est-mCnc: 243 mg/dL
Hgb A1c MFr Bld: 10.1 % — ABNORMAL HIGH (ref 4.8–5.6)

## 2018-02-10 LAB — HIV ANTIBODY (ROUTINE TESTING W REFLEX): HIV Screen 4th Generation wRfx: NONREACTIVE

## 2018-02-10 LAB — LIPID PANEL
Chol/HDL Ratio: 2.8 ratio (ref 0.0–5.0)
Cholesterol, Total: 182 mg/dL (ref 100–199)
HDL: 64 mg/dL (ref >39)
LDL CALC: 102 mg/dL — AB (ref 0–99)
Triglycerides: 80 mg/dL (ref 0–149)
VLDL Cholesterol Cal: 16 mg/dL (ref 5–40)

## 2018-02-10 LAB — HEPATITIS C ANTIBODY

## 2018-02-11 ENCOUNTER — Encounter: Payer: Self-pay | Admitting: Emergency Medicine

## 2018-02-11 ENCOUNTER — Other Ambulatory Visit: Payer: Self-pay | Admitting: Emergency Medicine

## 2018-02-11 DIAGNOSIS — E1165 Type 2 diabetes mellitus with hyperglycemia: Secondary | ICD-10-CM

## 2018-02-11 MED ORDER — GLIPIZIDE 5 MG PO TABS: 5.0000 mg | ORAL_TABLET | Freq: Every day | ORAL | 3 refills | Status: DC

## 2018-02-11 MED ORDER — METFORMIN HCL 500 MG PO TABS: 500.0000 mg | ORAL_TABLET | Freq: Two times a day (BID) | ORAL | 3 refills | Status: DC

## 2018-02-12 ENCOUNTER — Encounter: Payer: Self-pay | Admitting: Radiology

## 2018-02-25 ENCOUNTER — Telehealth: Payer: Self-pay | Admitting: Emergency Medicine

## 2018-02-25 ENCOUNTER — Encounter: Payer: Self-pay | Admitting: Emergency Medicine

## 2018-02-25 DIAGNOSIS — E1165 Type 2 diabetes mellitus with hyperglycemia: Secondary | ICD-10-CM

## 2018-02-25 LAB — HM DIABETES EYE EXAM

## 2018-02-25 MED ORDER — METFORMIN HCL 500 MG PO TABS: 500.0000 mg | ORAL_TABLET | Freq: Two times a day (BID) | ORAL | 3 refills | Status: DC

## 2018-02-25 MED ORDER — GLIPIZIDE 5 MG PO TABS: 5.0000 mg | ORAL_TABLET | Freq: Every day | ORAL | 3 refills | Status: DC

## 2018-02-25 NOTE — Telephone Encounter (Signed)
Medication resent to [facility] as requested by pt. Medication was previously sent to [facility].

## 2018-02-25 NOTE — Telephone Encounter (Signed)
Copied from [place] [phone]. Topic: Quick Communication - Rx Refill/Question >> Feb 25, 2018  1:20 PM Mcneil, Ja-Kwan wrote: Pt states he had an appt on 02/09/18 and was told 2 Rx would be sent to his pharmacy however he no longer uses the [facility] so all his Rx refill requests should be sent to [facility]  Medication: metFORMIN (GLUCOPHAGE) 500 MG tablet  and  glipiZIDE (GLUCOTROL) 5 MG tablet  Has the patient contacted their pharmacy? yes   Preferred Pharmacy (with phone number or street name): [place], [place] [phone] (Phone) [phone] (Fax)  Agent: Please be advised that RX refills may take up to 3 business days. We ask that you follow-up with your pharmacy.

## 2018-04-07 ENCOUNTER — Telehealth: Payer: Self-pay | Admitting: Emergency Medicine

## 2018-04-07 NOTE — Telephone Encounter (Signed)
Copied from [place] [phone]. Topic: General - Other >> Apr 07, 2018  4:18 PM Keene Breath wrote: Reason for CRM: Patient called to speak with the nurse or doctor regarding a Glucose Meter and a BP machine.  Patient would like to discuss receiving these machine and speak with the doctor or nurse further.  CB# [phone].

## 2018-04-08 NOTE — Telephone Encounter (Signed)
Please advise. Scott Dodson

## 2018-04-14 ENCOUNTER — Other Ambulatory Visit: Payer: Self-pay | Admitting: Emergency Medicine

## 2018-04-14 MED ORDER — CARVEDILOL 25 MG PO TABS: ORAL_TABLET | ORAL | 1 refills | Status: DC

## 2018-04-14 NOTE — Telephone Encounter (Signed)
Copied from [place] [phone]. Topic: Quick Communication - Rx Refill/Question >> Apr 14, 2018  1:38 PM Leward Quan A wrote: Medication: carvedilol (COREG) 25 MG tablet  Has the patient contacted their pharmacy? Yes.     Preferred Pharmacy (with phone number or street name): [place], [place] [phone] (Phone) [phone] (Fax)    Agent: Please be advised that RX refills may take up to 3 business days. We ask that you follow-up with your pharmacy.

## 2018-04-14 NOTE — Telephone Encounter (Signed)
Patient called to check the status on the Blood glucose monitor and the blood pressure monitor that was suggested by nurse upon home visit to be ordered for him. Also asked about Colonoscopy that was to be ordered, was given the number for [facility]. And request a call back from Dr or Nurse to discuss these issues. Ph# [phone]

## 2018-04-14 NOTE — Telephone Encounter (Signed)
Requested Prescriptions  Pending Prescriptions Disp Refills  . carvedilol (COREG) 25 MG tablet 180 tablet 1     Cardiovascular:  Beta Blockers Failed - 04/14/2018  1:46 PM      Failed - Last BP in normal range    BP Readings from Last 1 Encounters:  02/09/18 (!) 156/84         Passed - Last Heart Rate in normal range    Pulse Readings from Last 1 Encounters:  02/09/18 (!) 59         Passed - Valid encounter within last 6 months    Recent Outpatient Visits          2 months ago Essential hypertension   [facility], [place], MD   1 year ago Essential hypertension   [facility], Kayleen Memos, MD   1 year ago Asthma with acute exacerbation, unspecified asthma severity, unspecified whether persistent   [facility], Kayleen Memos, MD   3 years ago Cough   [facility], [facility]   3 years ago Immunizations reviewed and up to date   [facility] at [facility], F[place] Malling, MD      Future Appointments            In 4 months Sagardia, Ines Bloomer, MD [facility] [place], [facility]

## 2018-04-15 NOTE — Telephone Encounter (Signed)
LMOVM - 1) colonoscopy referral made 02/18/2018 with [facility]. Advised pt to call them [phone] and expedite making that appt.  2) advised pt to have [facility] RN call and give [place] they type Glucose monitor and BP monitor she recommends and we can get those orders put in.

## 2018-04-23 ENCOUNTER — Other Ambulatory Visit: Payer: Self-pay | Admitting: *Deleted

## 2018-04-23 ENCOUNTER — Telehealth: Payer: Self-pay | Admitting: *Deleted

## 2018-04-23 DIAGNOSIS — E1165 Type 2 diabetes mellitus with hyperglycemia: Secondary | ICD-10-CM

## 2018-04-23 NOTE — Telephone Encounter (Signed)
Faxed medical clarification request for Coreg 25 mg take 1 tablet by mouth 2 times daily with a meal to [facility], per Dr Mitchel Honour. Confirmation page received at 2:01 pm.

## 2018-06-01 ENCOUNTER — Other Ambulatory Visit: Payer: Self-pay

## 2018-06-01 DIAGNOSIS — I10 Essential (primary) hypertension: Secondary | ICD-10-CM

## 2018-06-01 MED ORDER — LISINOPRIL 40 MG PO TABS: 40.0000 mg | ORAL_TABLET | Freq: Every day | ORAL | 2 refills | Status: DC

## 2018-08-12 ENCOUNTER — Encounter: Payer: Self-pay | Admitting: Emergency Medicine

## 2018-08-12 ENCOUNTER — Encounter: Payer: Self-pay | Admitting: Gastroenterology

## 2018-08-12 ENCOUNTER — Ambulatory Visit (INDEPENDENT_AMBULATORY_CARE_PROVIDER_SITE_OTHER): Payer: Medicare Other | Admitting: Emergency Medicine

## 2018-08-12 ENCOUNTER — Telehealth: Payer: Self-pay | Admitting: *Deleted

## 2018-08-12 VITALS — BP 162/74 | HR 61 | Temp 97.8°F | Resp 16 | Ht 70.0 in | Wt 205.0 lb

## 2018-08-12 DIAGNOSIS — E1165 Type 2 diabetes mellitus with hyperglycemia: Secondary | ICD-10-CM

## 2018-08-12 DIAGNOSIS — I10 Essential (primary) hypertension: Secondary | ICD-10-CM | POA: Diagnosis not present

## 2018-08-12 DIAGNOSIS — Z23 Encounter for immunization: Secondary | ICD-10-CM | POA: Diagnosis not present

## 2018-08-12 DIAGNOSIS — Z1211 Encounter for screening for malignant neoplasm of colon: Secondary | ICD-10-CM

## 2018-08-12 LAB — POCT GLYCOSYLATED HEMOGLOBIN (HGB A1C): Hemoglobin A1C: 6.2 % — AB (ref 4.0–5.6)

## 2018-08-12 LAB — GLUCOSE, POCT (MANUAL RESULT ENTRY): POC GLUCOSE: 155 mg/dL — AB (ref 70–99)

## 2018-08-12 MED ORDER — GLIPIZIDE 5 MG PO TABS: 5.0000 mg | ORAL_TABLET | Freq: Every day | ORAL | 1 refills | Status: DC

## 2018-08-12 MED ORDER — CARVEDILOL 25 MG PO TABS: 25.0000 mg | ORAL_TABLET | Freq: Two times a day (BID) | ORAL | 1 refills | Status: DC

## 2018-08-12 MED ORDER — ZOSTER VAC RECOMB ADJUVANTED 50 MCG/0.5ML IM SUSR: 0.5000 mL | Freq: Once | INTRAMUSCULAR | 0 refills | Status: AC

## 2018-08-12 MED ORDER — ZOSTER VAC RECOMB ADJUVANTED 50 MCG/0.5ML IM SUSR
0.5000 mL | Freq: Once | INTRAMUSCULAR | 0 refills | Status: DC
Start: 2018-08-12 — End: 2018-08-12

## 2018-08-12 NOTE — Assessment & Plan Note (Signed)
Well-controlled.  Continue present medications.  Follow-up in 6 months. 

## 2018-08-12 NOTE — Patient Instructions (Addendum)
   If you have lab work done today you will be contacted with your lab results within the next 2 weeks.  If you have not heard from us then please contact us. The fastest way to get your results is to register for My Chart.   IF you received an x-ray today, you will receive an invoice from [facility]. Please contact [facility] at [phone] with questions or concerns regarding your invoice.   IF you received labwork today, you will receive an invoice from [facility]. Please contact [facility] at [phone] with questions or concerns regarding your invoice.   Our billing staff will not be able to assist you with questions regarding bills from these companies.  You will be contacted with the lab results as soon as they are available. The fastest way to get your results is to activate your My Chart account. Instructions are located on the last page of this paperwork. If you have not heard from us regarding the results in 2 weeks, please contact this office.     Diabetes Mellitus and Nutrition, Adult When you have diabetes (diabetes mellitus), it is very important to have healthy eating habits because your blood sugar (glucose) levels are greatly affected by what you eat and drink. Eating healthy foods in the appropriate amounts, at about the same times every day, can help you:  Control your blood glucose.  Lower your risk of heart disease.  Improve your blood pressure.  Reach or maintain a healthy weight. Every person with diabetes is different, and each person has different needs for a meal plan. Your health care provider may recommend that you work with a diet and nutrition specialist (dietitian) to make a meal plan that is best for you. Your meal plan may vary depending on factors such as:  The calories you need.  The medicines you take.  Your weight.  Your blood glucose, blood pressure, and cholesterol levels.  Your activity level.  Other health conditions  you have, such as heart or kidney disease. How do carbohydrates affect me? Carbohydrates, also called carbs, affect your blood glucose level more than any other type of food. Eating carbs naturally raises the amount of glucose in your blood. Carb counting is a method for keeping track of how many carbs you eat. Counting carbs is important to keep your blood glucose at a healthy level, especially if you use insulin or take certain oral diabetes medicines. It is important to know how many carbs you can safely have in each meal. This is different for every person. Your dietitian can help you calculate how many carbs you should have at each meal and for each snack. Foods that contain carbs include:  Bread, cereal, rice, pasta, and crackers.  Potatoes and corn.  Peas, beans, and lentils.  Milk and yogurt.  Fruit and juice.  Desserts, such as cakes, cookies, ice cream, and candy. How does alcohol affect me? Alcohol can cause a sudden decrease in blood glucose (hypoglycemia), especially if you use insulin or take certain oral diabetes medicines. Hypoglycemia can be a life-threatening condition. Symptoms of hypoglycemia (sleepiness, dizziness, and confusion) are similar to symptoms of having too much alcohol. If your health care provider says that alcohol is safe for you, follow these guidelines:  Limit alcohol intake to no more than 1 drink per day for nonpregnant women and 2 drinks per day for men. One drink equals 12 oz of beer, 5 oz of wine, or 1 oz of hard liquor.    Do not drink on an empty stomach.  Keep yourself hydrated with water, diet soda, or unsweetened iced tea.  Keep in mind that regular soda, juice, and other mixers may contain a lot of sugar and must be counted as carbs. What are tips for following this plan?  Reading food labels  Start by checking the serving size on the "Nutrition Facts" label of packaged foods and drinks. The amount of calories, carbs, fats, and other  nutrients listed on the label is based on one serving of the item. Many items contain more than one serving per package.  Check the total grams (g) of carbs in one serving. You can calculate the number of servings of carbs in one serving by dividing the total carbs by 15. For example, if a food has 30 g of total carbs, it would be equal to 2 servings of carbs.  Check the number of grams (g) of saturated and trans fats in one serving. Choose foods that have low or no amount of these fats.  Check the number of milligrams (mg) of salt (sodium) in one serving. Most people should limit total sodium intake to less than 2,300 mg per day.  Always check the nutrition information of foods labeled as "low-fat" or "nonfat". These foods may be higher in added sugar or refined carbs and should be avoided.  Talk to your dietitian to identify your daily goals for nutrients listed on the label. Shopping  Avoid buying canned, premade, or processed foods. These foods tend to be high in fat, sodium, and added sugar.  Shop around the outside edge of the grocery store. This includes fresh fruits and vegetables, bulk grains, fresh meats, and fresh dairy. Cooking  Use low-heat cooking methods, such as baking, instead of high-heat cooking methods like deep frying.  Cook using healthy oils, such as olive, canola, or sunflower oil.  Avoid cooking with butter, cream, or high-fat meats. Meal planning  Eat meals and snacks regularly, preferably at the same times every day. Avoid going long periods of time without eating.  Eat foods high in fiber, such as fresh fruits, vegetables, beans, and whole grains. Talk to your dietitian about how many servings of carbs you can eat at each meal.  Eat 4-6 ounces (oz) of lean protein each day, such as lean meat, chicken, fish, eggs, or tofu. One oz of lean protein is equal to: ? 1 oz of meat, chicken, or fish. ? 1 egg. ?  cup of tofu.  Eat some foods each day that contain  healthy fats, such as avocado, nuts, seeds, and fish. Lifestyle  Check your blood glucose regularly.  Exercise regularly as told by your health care provider. This may include: ? 150 minutes of moderate-intensity or vigorous-intensity exercise each week. This could be brisk walking, biking, or water aerobics. ? Stretching and doing strength exercises, such as yoga or weightlifting, at least 2 times a week.  Take medicines as told by your health care provider.  Do not use any products that contain nicotine or tobacco, such as cigarettes and e-cigarettes. If you need help quitting, ask your health care provider.  Work with a counselor or diabetes educator to identify strategies to manage stress and any emotional and social challenges. Questions to ask a health care provider  Do I need to meet with a diabetes educator?  Do I need to meet with a dietitian?  What number can I call if I have questions?  When are the best times to   check my blood glucose? Where to find more information:  American Diabetes Association: diabetes.org  Academy of Nutrition and Dietetics: www.eatright.org  National Institute of Diabetes and Digestive and Kidney Diseases (NIH): www.niddk.nih.gov Summary  A healthy meal plan will help you control your blood glucose and maintain a healthy lifestyle.  Working with a diet and nutrition specialist (dietitian) can help you make a meal plan that is best for you.  Keep in mind that carbohydrates (carbs) and alcohol have immediate effects on your blood glucose levels. It is important to count carbs and to use alcohol carefully. This information is not intended to replace advice given to you by your health care provider. Make sure you discuss any questions you have with your health care provider. Document Released: 03/14/2005 Document Revised: 01/15/2017 Document Reviewed: 07/22/2016 Elsevier Interactive Patient Education  2019 Elsevier Inc.  

## 2018-08-12 NOTE — Progress Notes (Signed)
BP Readings from Last 3 Encounters:  02/09/18 (!) 156/84  10/13/17 (!) 186/106  05/23/17 (!) 169/85   Lab Results  Component Value Date   HGBA1C 10.1 (H) 02/09/2018   Irven Shelling 68 y.o.   Chief Complaint  Patient presents with  . Hypertension    f/u    HISTORY OF PRESENT ILLNESS: This is a 68 y.o. male with history of hypertension diabetes, here for follow-up.  Doing well.  Compliant with medications.  Compliant with diet and exercise as well.  Has no complaints or medical concerns today.  Blood pressure readings at home within normal range.  HPI   Prior to Admission medications   Medication Sig Start Date End Date Taking? Authorizing Provider  albuterol (PROVENTIL HFA;VENTOLIN HFA) 108 (90 Base) MCG/ACT inhaler Inhale 2 puffs into the lungs every 6 (six) hours as needed for wheezing or shortness of breath (cough, shortness of breath or wheezing.). 10/13/17  Yes Jaynee Eagles, PA-C  amLODipine (NORVASC) 5 MG tablet TAKE 1 TABLET BY MOUTH EVERY DAY 02/09/18  Yes Christy Ehrsam, Ines Bloomer, MD  aspirin 81 MG tablet Take 81 mg by mouth daily.   Yes [provider]  carvedilol (COREG) 25 MG tablet Use previous signature 04/14/18  Yes Lanya Bucks, Ines Bloomer, MD  cetirizine (ZYRTEC ALLERGY) 10 MG tablet Take 1 tablet (10 mg total) by mouth daily. 10/13/17  Yes Jaynee Eagles, PA-C  fluticasone (FLONASE) 50 MCG/ACT nasal spray Place 2 sprays into both nostrils daily. 10/13/17  Yes Jaynee Eagles, PA-C  glipiZIDE (GLUCOTROL) 5 MG tablet Take 1 tablet (5 mg total) by mouth daily before breakfast. 02/25/18  Yes Melquan Ernsberger, Ines Bloomer, MD  lisinopril (PRINIVIL,ZESTRIL) 40 MG tablet Take 1 tablet (40 mg total) by mouth daily. 06/01/18  Yes Jimmi Sidener, Ines Bloomer, MD  metFORMIN (GLUCOPHAGE) 500 MG tablet Take 1 tablet (500 mg total) by mouth 2 (two) times daily with a meal. 02/25/18  Yes Ulonda Klosowski, Ines Bloomer, MD  montelukast (SINGULAIR) 10 MG tablet Take 1 tablet (10 mg total) by mouth at bedtime.  10/13/17  Yes Jaynee Eagles, PA-C    No Known Allergies  Patient Active Problem List   Diagnosis Date Noted  . History of diabetes mellitus 02/09/2018  . Need for hepatitis C screening test 02/09/2018  . Screening for HIV (human immunodeficiency virus) 02/09/2018  . Need for shingles vaccine 02/09/2018  . CARDIOMYOPATHY, ISCHEMIC 05/18/2010  . CORONARY ARTERY DISEASE 10/11/2009  . DIABETES MELLITUS, TYPE II 10/09/2009  . HYPERLIPIDEMIA 10/09/2009  . Essential hypertension 10/09/2009  . MYOCARDIAL INFARCTION, HX OF 10/09/2009    Past Medical History:  Diagnosis Date  . Allergy   . Asthma   . Coronary artery disease    s/p lateral MI 4 /11, s/p Promus DES OM2  . Heart murmur   . Hyperlipidemia   . Hypertension   . Myocardial infarct [facility])     Past Surgical History:  Procedure Laterality Date  . ADENOIDECTOMY    . CORONARY STENT PLACEMENT    . TONSILLECTOMY    . VASECTOMY      Social History   Socioeconomic History  . Marital status: Married    Spouse name: Not on file  . Number of children: Not on file  . Years of education: Not on file  . Highest education level: Not on file  Occupational History  . Occupation: Charity fundraiser  Social Needs  . Financial resource strain: Not on file  . Food insecurity:    Worry: Not  on file    Inability: Not on file  . Transportation needs:    Medical: Not on file    Non-medical: Not on file  Tobacco Use  . Smoking status: Former Research scientist (life sciences)  . Smokeless tobacco: Never Used  Substance and Sexual Activity  . Alcohol use: Yes  . Drug use: No  . Sexual activity: Not on file  Lifestyle  . Physical activity:    Days per week: Not on file    Minutes per session: Not on file  . Stress: Not on file  Relationships  . Social connections:    Talks on phone: Not on file    Gets together: Not on file    Attends religious service: Not on file    Active member of club or organization: Not on file    Attends meetings of  clubs or organizations: Not on file    Relationship status: Not on file  . Intimate partner violence:    Fear of current or ex partner: Not on file    Emotionally abused: Not on file    Physically abused: Not on file    Forced sexual activity: Not on file  Other Topics Concern  . Not on file  Social History Narrative   Occupation- Photographer    Former -tobacco -stopped 1979   Married ,3 children     Alcohol use - yes , social    Drug use - no   Regular exercise- yes     Family History  Problem Relation Age of Onset  . Heart disease Mother   . Hypertension Mother   . Heart disease Father   . Hypertension Father   . Heart disease Brother   . Heart disease Sister      Review of Systems  Constitutional: Negative.  Negative for chills and fever.  HENT: Negative.   Eyes: Negative.  Negative for blurred vision and double vision.  Respiratory: Negative.  Negative for cough and shortness of breath.   Cardiovascular: Negative.  Negative for chest pain and palpitations.  Gastrointestinal: Negative.  Negative for abdominal pain, blood in stool, diarrhea, melena, nausea and vomiting.  Genitourinary: Negative.   Skin: Negative.  Negative for rash.  Neurological: Negative.  Negative for dizziness and headaches.  Endo/Heme/Allergies: Negative.   All other systems reviewed and are negative.   Vitals:   08/12/18 0804  BP: (!) 162/74  Pulse: 61  Resp: 16  Temp: 97.8 F (36.6 C)  SpO2: 96%    Physical Exam Vitals signs reviewed.  Constitutional:      Appearance: Normal appearance.  HENT:     Head: Normocephalic and atraumatic.     Nose: Nose normal.     Mouth/Throat:     Mouth: Mucous membranes are moist.     Pharynx: Oropharynx is clear.  Eyes:     Extraocular Movements: Extraocular movements intact.     Conjunctiva/sclera: Conjunctivae normal.     Pupils: Pupils are equal, round, and reactive to light.  Neck:     Musculoskeletal: Normal range of motion  and neck supple.  Cardiovascular:     Rate and Rhythm: Normal rate and regular rhythm.  Pulmonary:     Effort: Pulmonary effort is normal.     Breath sounds: Normal breath sounds.  Abdominal:     General: There is no distension.     Palpations: Abdomen is soft.     Tenderness: There is no abdominal tenderness.  Musculoskeletal: Normal range of motion.  General: No swelling.  Skin:    General: Skin is warm and dry.  Neurological:     General: No focal deficit present.     Mental Status: He is alert and oriented to person, place, and time.  Psychiatric:        Mood and Affect: Mood normal.        Behavior: Behavior normal.    Results for orders placed or performed in visit on 08/12/18 (from the past 24 hour(s))  POCT glucose (manual entry)     Status: Abnormal   Collection Time: 08/12/18  8:37 AM  Result Value Ref Range   POC Glucose 155 (A) 70 - 99 mg/dl  POCT glycosylated hemoglobin (Hb A1C)     Status: Abnormal   Collection Time: 08/12/18  8:40 AM  Result Value Ref Range   Hemoglobin A1C 6.2 (A) 4.0 - 5.6 %   HbA1c POC (<> result, manual entry)     HbA1c, POC (prediabetic range)     HbA1c, POC (controlled diabetic range)       ASSESSMENT & PLAN: Essential hypertension Well-controlled.  Continue present medications.  Follow-up in 6 months.  DIABETES MELLITUS, TYPE II Well-controlled diabetes with hemoglobin A1c of 6.2.  Continue present medications, diet and exercise.  Follow-up in 6 months.  Townes was seen today for hypertension.  Diagnoses and all orders for this visit:  Type 2 diabetes mellitus with hyperglycemia, without long-term current use of insulin (HCC) -     Comprehensive metabolic panel -     POCT glycosylated hemoglobin (Hb A1C) -     POCT glucose (manual entry) -     Lipid panel -     glipiZIDE (GLUCOTROL) 5 MG tablet; Take 1 tablet (5 mg total) by mouth daily before breakfast.  Essential hypertension -     Comprehensive metabolic panel -      Lipid panel  Need for pneumococcal vaccine -     Pneumococcal conjugate vaccine 13-valent IM  Need for shingles vaccine -     Discontinue: Zoster Vaccine Adjuvanted [facility]) injection; Inject 0.5 mLs into the muscle once for 1 dose. -     Zoster Vaccine Adjuvanted [facility]) injection; Inject 0.5 mLs into the muscle once for 1 dose.  Colon cancer screening -     Ambulatory referral to Gastroenterology  Other orders -     carvedilol (COREG) 25 MG tablet; Take 1 tablet (25 mg total) by mouth 2 (two) times daily with a meal. Use previous signature    Patient Instructions       If you have lab work done today you will be contacted with your lab results within the next 2 weeks.  If you have not heard from [place] then please contact us. The fastest way to get your results is to register for My Chart.   IF you received an x-ray today, you will receive an invoice from [facility]. Please contact [facility] at [phone] with questions or concerns regarding your invoice.   IF you received labwork today, you will receive an invoice from [place]. Please contact [facility] at [phone] with questions or concerns regarding your invoice.   Our billing staff will not be able to assist you with questions regarding bills from these companies.  You will be contacted with the lab results as soon as they are available. The fastest way to get your results is to activate your My Chart account. Instructions are located on the last page of this paperwork. If  you have not heard from [place] regarding the results in 2 weeks, please contact this office.     Diabetes Mellitus and Nutrition, Adult When you have diabetes (diabetes mellitus), it is very important to have healthy eating habits because your blood sugar (glucose) levels are greatly affected by what you eat and drink. Eating healthy foods in the appropriate amounts, at about the same times every day, can help you:  Control  your blood glucose.  Lower your risk of heart disease.  Improve your blood pressure.  Reach or maintain a healthy weight. Every person with diabetes is different, and each person has different needs for a meal plan. Your health care provider may recommend that you work with a diet and nutrition specialist (dietitian) to make a meal plan that is best for you. Your meal plan may vary depending on factors such as:  The calories you need.  The medicines you take.  Your weight.  Your blood glucose, blood pressure, and cholesterol levels.  Your activity level.  Other health conditions you have, such as heart or kidney disease. How do carbohydrates affect me? Carbohydrates, also called carbs, affect your blood glucose level more than any other type of food. Eating carbs naturally raises the amount of glucose in your blood. Carb counting is a method for keeping track of how many carbs you eat. Counting carbs is important to keep your blood glucose at a healthy level, especially if you use insulin or take certain oral diabetes medicines. It is important to know how many carbs you can safely have in each meal. This is different for every person. Your dietitian can help you calculate how many carbs you should have at each meal and for each snack. Foods that contain carbs include:  Bread, cereal, rice, pasta, and crackers.  Potatoes and corn.  Peas, beans, and lentils.  Milk and yogurt.  Fruit and juice.  Desserts, such as cakes, cookies, ice cream, and candy. How does alcohol affect me? Alcohol can cause a sudden decrease in blood glucose (hypoglycemia), especially if you use insulin or take certain oral diabetes medicines. Hypoglycemia can be a life-threatening condition. Symptoms of hypoglycemia (sleepiness, dizziness, and confusion) are similar to symptoms of having too much alcohol. If your health care provider says that alcohol is safe for you, follow these guidelines:  Limit  alcohol intake to no more than 1 drink per day for nonpregnant women and 2 drinks per day for men. One drink equals 12 oz of beer, 5 oz of wine, or 1 oz of hard liquor.  Do not drink on an empty stomach.  Keep yourself hydrated with water, diet soda, or unsweetened iced tea.  Keep in mind that regular soda, juice, and other mixers may contain a lot of sugar and must be counted as carbs. What are tips for following this plan?  Reading food labels  Start by checking the serving size on the "Nutrition Facts" label of packaged foods and drinks. The amount of calories, carbs, fats, and other nutrients listed on the label is based on one serving of the item. Many items contain more than one serving per package.  Check the total grams (g) of carbs in one serving. You can calculate the number of servings of carbs in one serving by dividing the total carbs by 15. For example, if a food has 30 g of total carbs, it would be equal to 2 servings of carbs.  Check the number of grams (g) of saturated and  trans fats in one serving. Choose foods that have low or no amount of these fats.  Check the number of milligrams (mg) of salt (sodium) in one serving. Most people should limit total sodium intake to less than 2,300 mg per day.  Always check the nutrition information of foods labeled as "low-fat" or "nonfat". These foods may be higher in added sugar or refined carbs and should be avoided.  Talk to your dietitian to identify your daily goals for nutrients listed on the label. Shopping  Avoid buying canned, premade, or processed foods. These foods tend to be high in fat, sodium, and added sugar.  Shop around the outside edge of the grocery store. This includes fresh fruits and vegetables, bulk grains, fresh meats, and fresh dairy. Cooking  Use low-heat cooking methods, such as baking, instead of high-heat cooking methods like deep frying.  Cook using healthy oils, such as olive, canola, or sunflower  oil.  Avoid cooking with butter, cream, or high-fat meats. Meal planning  Eat meals and snacks regularly, preferably at the same times every day. Avoid going long periods of time without eating.  Eat foods high in fiber, such as fresh fruits, vegetables, beans, and whole grains. Talk to your dietitian about how many servings of carbs you can eat at each meal.  Eat 4-6 ounces (oz) of lean protein each day, such as lean meat, chicken, fish, eggs, or tofu. One oz of lean protein is equal to: ? 1 oz of meat, chicken, or fish. ? 1 egg. ?  cup of tofu.  Eat some foods each day that contain healthy fats, such as avocado, nuts, seeds, and fish. Lifestyle  Check your blood glucose regularly.  Exercise regularly as told by your health care provider. This may include: ? 150 minutes of moderate-intensity or vigorous-intensity exercise each week. This could be brisk walking, biking, or water aerobics. ? Stretching and doing strength exercises, such as yoga or weightlifting, at least 2 times a week.  Take medicines as told by your health care provider.  Do not use any products that contain nicotine or tobacco, such as cigarettes and e-cigarettes. If you need help quitting, ask your health care provider.  Work with a Social worker or diabetes educator to identify strategies to manage stress and any emotional and social challenges. Questions to ask a health care provider  Do I need to meet with a diabetes educator?  Do I need to meet with a dietitian?  What number can I call if I have questions?  When are the best times to check my blood glucose? Where to find more information:  American Diabetes Association: diabetes.org  Academy of Nutrition and Dietetics: www.eatright.[employer] of Diabetes and Digestive and Kidney Diseases (NIH): [employer] Summary  A healthy meal plan will help you control your blood glucose and maintain a healthy lifestyle.  Working with a diet  and nutrition specialist (dietitian) can help you make a meal plan that is best for you.  Keep in mind that carbohydrates (carbs) and alcohol have immediate effects on your blood glucose levels. It is important to count carbs and to use alcohol carefully. This information is not intended to replace advice given to you by your health care provider. Make sure you discuss any questions you have with your health care provider. Document Released: 03/14/2005 Document Revised: 01/15/2017 Document Reviewed: 07/22/2016 Elsevier Interactive Patient Education  2019 Elsevier Inc.      Agustina Caroli, MD [facility]

## 2018-08-12 NOTE — Assessment & Plan Note (Signed)
Well-controlled diabetes with hemoglobin A1c of 6.2.  Continue present medications, diet and exercise.  Follow-up in 6 months.

## 2018-08-12 NOTE — Telephone Encounter (Signed)
Per Jenny Reichmann, Pharmacist at [facility] [phone], Carvedilol 25 mg 1 tablet bid, #180 was dispensed on 07/31/2018. Rx that was sent today for Carvedilol is on hold.

## 2018-08-13 ENCOUNTER — Encounter: Payer: Self-pay | Admitting: Emergency Medicine

## 2018-08-13 ENCOUNTER — Other Ambulatory Visit: Payer: Self-pay | Admitting: Emergency Medicine

## 2018-08-13 LAB — COMPREHENSIVE METABOLIC PANEL
A/G RATIO: 2 (ref 1.2–2.2)
ALT: 14 IU/L (ref 0–44)
AST: 9 IU/L (ref 0–40)
Albumin: 4.4 g/dL (ref 3.8–4.8)
Alkaline Phosphatase: 62 IU/L (ref 39–117)
BUN/Creatinine Ratio: 15 (ref 10–24)
BUN: 17 mg/dL (ref 8–27)
Bilirubin Total: 0.5 mg/dL (ref 0.0–1.2)
CO2: 21 mmol/L (ref 20–29)
CREATININE: 1.16 mg/dL (ref 0.76–1.27)
Calcium: 9.5 mg/dL (ref 8.6–10.2)
Chloride: 106 mmol/L (ref 96–106)
GFR, EST AFRICAN AMERICAN: 76 mL/min/{1.73_m2} (ref >59)
GFR, EST NON AFRICAN AMERICAN: 66 mL/min/{1.73_m2} (ref >59)
GLOBULIN, TOTAL: 2.2 g/dL (ref 1.5–4.5)
Glucose: 158 mg/dL — ABNORMAL HIGH (ref 65–99)
POTASSIUM: 4.8 mmol/L (ref 3.5–5.2)
Sodium: 143 mmol/L (ref 134–144)
Total Protein: 6.6 g/dL (ref 6.0–8.5)

## 2018-08-13 LAB — LIPID PANEL
CHOLESTEROL TOTAL: 222 mg/dL — AB (ref 100–199)
Chol/HDL Ratio: 4.2 ratio (ref 0.0–5.0)
HDL: 53 mg/dL (ref >39)
LDL Calculated: 113 mg/dL — ABNORMAL HIGH (ref 0–99)
TRIGLYCERIDES: 280 mg/dL — AB (ref 0–149)
VLDL Cholesterol Cal: 56 mg/dL — ABNORMAL HIGH (ref 5–40)

## 2018-08-13 MED ORDER — ROSUVASTATIN CALCIUM 20 MG PO TABS: 20.0000 mg | ORAL_TABLET | Freq: Every day | ORAL | 3 refills | Status: DC

## 2018-09-14 ENCOUNTER — Telehealth: Payer: Self-pay | Admitting: *Deleted

## 2018-09-14 NOTE — Telephone Encounter (Signed)
Covid-19 travel screening questions  Have you traveled in the last 14 days?   YES_ Within [place] and [place]  If yes where? [place] and [place]   Do you now or have you had a fever in the last 14 days?- No   Do you have any respiratory symptoms of shortness of breath or cough now or in the last 14 days? - No  Do you have a medical history of Congestive Heart Failure?- No   Do you have a medical history of lung disease? No   Do you have any family members or close contacts with diagnosed or suspected Covid-19? No

## 2018-09-15 ENCOUNTER — Ambulatory Visit (AMBULATORY_SURGERY_CENTER): Payer: Self-pay | Admitting: *Deleted

## 2018-09-15 ENCOUNTER — Encounter: Payer: Self-pay | Admitting: Gastroenterology

## 2018-09-15 ENCOUNTER — Other Ambulatory Visit: Payer: Self-pay

## 2018-09-15 VITALS — Temp 97.9°F | Ht 69.0 in | Wt 210.0 lb

## 2018-09-15 DIAGNOSIS — Z1211 Encounter for screening for malignant neoplasm of colon: Secondary | ICD-10-CM

## 2018-09-15 MED ORDER — NA SULFATE-K SULFATE-MG SULF 17.5-3.13-1.6 GM/177ML PO SOLN: ORAL | 0 refills | Status: DC

## 2018-09-15 NOTE — Progress Notes (Signed)
Patient denies any allergies to eggs or soy. Patient denies any problems with anesthesia/sedation. Patient denies any oxygen use at home. Patient denies taking any diet/weight loss medications or blood thinners. EMMI education assisgned to patient on colonoscopy, this was explained and instructions given to patient. 

## 2018-09-24 ENCOUNTER — Telehealth: Payer: Self-pay | Admitting: *Deleted

## 2018-09-24 NOTE — Telephone Encounter (Signed)
Spoke with patient.  Advised that we are cancelling procedure d/t covid 19 restrictions.  Informed that we will call back to reschedule.

## 2018-09-29 ENCOUNTER — Encounter: Payer: BLUE CROSS/BLUE SHIELD | Admitting: Gastroenterology

## 2018-10-14 ENCOUNTER — Encounter: Payer: Self-pay | Admitting: Registered Nurse

## 2018-10-14 ENCOUNTER — Ambulatory Visit (INDEPENDENT_AMBULATORY_CARE_PROVIDER_SITE_OTHER): Payer: Medicare Other | Admitting: Registered Nurse

## 2018-10-14 VITALS — BP 138/72 | Wt 210.0 lb

## 2018-10-14 DIAGNOSIS — Z0000 Encounter for general adult medical examination without abnormal findings: Secondary | ICD-10-CM | POA: Diagnosis not present

## 2018-10-14 NOTE — Progress Notes (Signed)
Scott Dodson is a pleasant 68 y.o. male resents today for Medicare Annual Wellness Visit This visit was conducted via D[employer]Me. The patient understands that this visit is taking place of an in-person visit. The patient's identity was confirmed with two identifiers prior to starting the visit.    Date of last exam: 08/20/2018 - Dr. Mitchel Honour, T2DM f/u    02/10/2019 - Dr. Mitchel Honour, HTN f/u  Interpreter used for this visit? No  Patient Care Team: Horald Pollen, MD as PCP - General (Internal Medicine) Dr. Angelena Form, MD, as cardiologist - not seen since 2015.  Other items to address today: Pt has lost care with cardiology since 2015. With hx significant for HTN, HLD, T2DM, MI (2011), murmur, and elevated BMI, pt was encouraged to reestablish care with Dr. Angelena Form in cardiology. Referral will be provided if needed. Pt was encouraged by cardiology for 1 year follow up visits.    Other Screening: Last screening for diabetes:  08/12/2018 : 6.2  Last lipid screening: Lipid Panel     Component Value Date/Time   CHOL 222 (H) 08/12/2018 0922   TRIG 280 (H) 08/12/2018 0922   HDL 53 08/12/2018 0922   CHOLHDL 4.2 08/12/2018 0922   CHOLHDL 3 01/11/2014 0841   VLDL 31.8 01/11/2014 0841   LDLCALC 113 (H) 08/12/2018 0922   LDLDIRECT 74.6 08/11/2012 0939     ADVANCE DIRECTIVES: Discussed: Yes On File: No Materials Provided: No  Immunization status:  Immunization History  Administered Date(s) Administered  . Pneumococcal Conjugate-13 08/12/2018  . Td 01/17/2010  . Tdap 08/10/2012     Health Maintenance Due  Topic Date Due  . COLONOSCOPY  01/26/2003  Pt to receive colonoscopy referral at next in-office visit.   Functional Status Survey: Is the patient deaf or have difficulty hearing?: No Does the patient have difficulty seeing, even when wearing glasses/contacts?: No Does the patient have difficulty concentrating, remembering, or making decisions?: No Does the patient  have difficulty walking or climbing stairs?: No Does the patient have difficulty dressing or bathing?: No Does the patient have difficulty doing errands alone such as visiting a doctor's office or shopping?: No Clinical Intake - 10/14/18 1009      Pre-visit preparation   Pre-visit preparation completed  Yes      Pain   Pain   No/denies pain      Nutrition Screen   Nutritional Status  BMI 25 -29 Overweight    Diabetes  Yes    CBG done?  No    Did pt. bring in CBG monitor from home?  No      Functional Status   Activities of Daily Living  Independent    Ambulation  Independent    Medication Administration  Independent    Home Management  Independent      Risk/Barriers   Barriers to Care Management & Learning  None      Abuse/Neglect   Do you feel unsafe in your current relationship?  No    Do you feel physically threatened by others?  No    Anyone hurting you at home, work, or school?  No    Unable to ask?  No    Information provided on Community resources  No      Patient Literacy   How often do you need to have someone help you when you read instructions, pamphlets, or other written materials from your doctor or pharmacy?  1 - Never      Language  Education officer, environmental Needed?  No       6CIT Screen 10/14/2018  What Year? 0 points  What month? 0 points  What time? 0 points  Count back from 20 0 points  Months in reverse 0 points  Repeat phrase 0 points  Total Score 0         Home Environment: Pt lives at home with wife, daughter, and 24 year old grandchild. Pt reports that he has good balance and is not typically concerned about falls, despite young child, dog toys, etc. Pt reports throw rugs and no grab bars in shower.    Patient Active Problem List   Diagnosis Date Noted  . History of diabetes mellitus 02/09/2018  . Need for hepatitis C screening test 02/09/2018  . Screening for HIV (human immunodeficiency virus) 02/09/2018  . Need for shingles vaccine  02/09/2018  . CARDIOMYOPATHY, ISCHEMIC 05/18/2010  . CORONARY ARTERY DISEASE 10/11/2009  . DIABETES MELLITUS, TYPE II 10/09/2009  . HYPERLIPIDEMIA 10/09/2009  . Essential hypertension 10/09/2009  . MYOCARDIAL INFARCTION, HX OF 10/09/2009     Past Medical History:  Diagnosis Date  . Allergy   . Asthma   . Coronary artery disease    s/p lateral MI 4 /11, s/p Promus DES OM2  . Diabetes mellitus without complication ([place])   . Heart murmur   . Hyperlipidemia   . Hypertension   . Myocardial infarct [facility]) 2011     Past Surgical History:  Procedure Laterality Date  . ADENOIDECTOMY    . CORONARY ARTERY BYPASS GRAFT  2011  . CORONARY STENT PLACEMENT    . TONSILLECTOMY    . TYMPANIC MEMBRANE REPAIR Right 2004  . VASECTOMY       Family History  Problem Relation Age of Onset  . Heart disease Mother   . Hypertension Mother   . Heart disease Father   . Hypertension Father   . Heart disease Brother   . Heart disease Sister   . Colon cancer Maternal Grandfather 68  . Colon polyps Neg Hx   . Esophageal cancer Neg Hx   . Stomach cancer Neg Hx   . Rectal cancer Neg Hx      Social History   Socioeconomic History  . Marital status: Married    Spouse name: Not on file  . Number of children: 3  . Years of education: Not on file  . Highest education level: Not on file  Occupational History  . Occupation: Charity fundraiser  . Occupation: retired  Scientific laboratory technician  . Financial resource strain: Not hard at all  . Food insecurity:    Worry: Never true    Inability: Never true  . Transportation needs:    Medical: No    Non-medical: No  Tobacco Use  . Smoking status: Former Smoker    Packs/day: 0.50    Years: 5.00    Pack years: 2.50    Types: Cigarettes    Last attempt to quit: 10/14/1974    Years since quitting: 44.0  . Smokeless tobacco: Never Used  Substance and Sexual Activity  . Alcohol use: Yes    Alcohol/week: 5.0 standard drinks    Types: 5 Standard  drinks or equivalent per week  . Drug use: No  . Sexual activity: Yes  Lifestyle  . Physical activity:    Days per week: 3 days    Minutes per session: 100 min  . Stress: Not at all  Relationships  . Social connections:  Talks on phone: Once a week    Gets together: Once a week    Attends religious service: More than 4 times per year    Active member of club or organization: Yes    Attends meetings of clubs or organizations: 1 to 4 times per year    Relationship status: Married  . Intimate partner violence:    Fear of current or ex partner: No    Emotionally abused: No    Physically abused: No    Forced sexual activity: No  Other Topics Concern  . Not on file  Social History Narrative   Occupation- Photographer    Former -tobacco -stopped 1979   Married ,3 children     Alcohol use - yes , social    Drug use - no   Regular exercise- yes      No Known Allergies   Prior to Admission medications   Medication Sig Start Date End Date Taking? Authorizing Provider  ALBUTEROL IN Inhale into the lungs.   Yes [provider]  amLODipine (NORVASC) 5 MG tablet TAKE 1 TABLET BY MOUTH EVERY DAY 02/09/18  Yes Sagardia, Ines Bloomer, MD  Ascorbic Acid (VITAMIN C) 1000 MG tablet Take 2,000 mg by mouth daily.   Yes [provider]  aspirin 81 MG tablet Take 81 mg by mouth daily.   Yes [provider]  carvedilol (COREG) 25 MG tablet Take 1 tablet (25 mg total) by mouth 2 (two) times daily with a meal. Use previous signature 08/12/18 11/10/18 Yes Sagardia, Ines Bloomer, MD  cetirizine (ZYRTEC ALLERGY) 10 MG tablet Take 1 tablet (10 mg total) by mouth daily. 10/13/17  Yes Jaynee Eagles, PA-C  Coenzyme Q10 (CO Q 10 PO) Take by mouth.   Yes [provider]  fluticasone (FLONASE) 50 MCG/ACT nasal spray Place 2 sprays into both nostrils daily. 10/13/17  Yes Jaynee Eagles, PA-C  glipiZIDE (GLUCOTROL) 5 MG tablet Take 1 tablet (5 mg total) by mouth daily  before breakfast. 08/12/18 11/10/18 Yes Sagardia, Ines Bloomer, MD  lisinopril (PRINIVIL,ZESTRIL) 40 MG tablet Take 1 tablet (40 mg total) by mouth daily. 06/01/18  Yes Horald Pollen, MD  metFORMIN (GLUCOPHAGE) 500 MG tablet Take 1 tablet (500 mg total) by mouth 2 (two) times daily with a meal. 02/25/18  Yes Sagardia, Ines Bloomer, MD  Na Sulfate-K Sulfate-Mg Sulf 17.5-3.13-1.6 GM/177ML SOLN Suprep (no substitutions)-TAKE AS DIRECTED. 09/15/18  Yes Danis, Estill Cotta III, MD  rosuvastatin (CRESTOR) 20 MG tablet Take 1 tablet (20 mg total) by mouth daily. 08/13/18  Yes Horald Pollen, MD     Depression screen [facility] 2/9 10/14/2018 08/12/2018 02/09/2018 12/12/2016 11/13/2016  Decreased Interest 0 0 0 0 0  Down, Depressed, Hopeless 0 0 0 0 0  PHQ - 2 Score 0 0 0 0 0     Fall Risk  10/14/2018 08/12/2018 02/09/2018 12/12/2016 11/13/2016  Falls in the past year? 1 1 No No No  Number falls in past yr: 0 0 - - -  Comment - fell on slippery deck, "Jammed right shoulder - - -  Injury with Fall? 1 - - - -      PHYSICAL EXAM:  Wt Readings from Last 3 Encounters:  10/14/18 210 lb (95.3 kg)  09/15/18 210 lb (95.3 kg)  08/12/18 205 lb (93 kg)  Pt self reports recent BP of 138/72 taken at home.  Pt reports weight has been steady since last visit in office.  As this was a virtual visit conducted via video chat on [email or web address], no physical exam could take place.    Education/Counseling provided regarding diet and exercise, prevention of chronic diseases, smoking/tobacco cessation, if applicable, and reviewed "Covered Medicare Preventive Services."   ASSESSMENT/PLAN:  Self-reported BP is: 138/72  Pt reports weight has stayed steady at 210lbs.  Immunization status: up to date and documented.  Pt fell and hurt R shoulder in January. Still has pain with abduction and flexion above 90 deg. He reports that occasional ibuprofen use helps to relieve pain. Pt reports he slipped on ice while playing with  dog, he is typically not concerned about his mobility or balance.

## 2018-10-30 ENCOUNTER — Other Ambulatory Visit: Payer: Self-pay

## 2018-10-30 DIAGNOSIS — I10 Essential (primary) hypertension: Secondary | ICD-10-CM

## 2018-10-30 MED ORDER — AMLODIPINE BESYLATE 5 MG PO TABS: ORAL_TABLET | ORAL | 1 refills | Status: DC

## 2018-11-03 ENCOUNTER — Telehealth: Payer: Self-pay | Admitting: *Deleted

## 2018-11-03 NOTE — Telephone Encounter (Signed)
Called pt to RS colon - RS to 5-18 Monday at 230 pm -- arrive at 130 pm- will My Chart and mail new instructions to pt - verified address with pt today. Pt has a Suprep Kit at home already  [place] [facility]

## 2018-11-13 ENCOUNTER — Telehealth: Payer: Self-pay | Admitting: *Deleted

## 2018-11-13 NOTE — Telephone Encounter (Signed)
Pt called back to clarify the traveling question. 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 He was negative for all other questions and no symptoms. Will notify Dr. Loletha Carrow. SM

## 2018-11-13 NOTE — Telephone Encounter (Signed)
Covid-19 travel screening questions  Have you traveled in the last 14 days?no If yes where?  Do you now or have you had a fever in the last 14 days?no  Do you have any respiratory symptoms of shortness of breath or cough now or in the last 14 days?no  Do you have a medical history of Congestive Heart Failure?  Do you have a medical history of lung disease?  Do you have any family members or close contacts with diagnosed or suspected Covid-19?no  Pt made aware of care partner policy and will bring a mask if he has one available. SM

## 2018-11-16 ENCOUNTER — Other Ambulatory Visit: Payer: Self-pay

## 2018-11-16 ENCOUNTER — Ambulatory Visit (AMBULATORY_SURGERY_CENTER): Payer: Medicare Other | Admitting: Gastroenterology

## 2018-11-16 ENCOUNTER — Encounter: Payer: Self-pay | Admitting: Gastroenterology

## 2018-11-16 VITALS — BP 155/100 | HR 61 | Temp 98.9°F | Resp 15 | Ht 69.0 in | Wt 210.0 lb

## 2018-11-16 DIAGNOSIS — Z1211 Encounter for screening for malignant neoplasm of colon: Secondary | ICD-10-CM

## 2018-11-16 DIAGNOSIS — D122 Benign neoplasm of ascending colon: Secondary | ICD-10-CM

## 2018-11-16 MED ORDER — SODIUM CHLORIDE 0.9 % IV SOLN: 500.0000 mL | Freq: Once | INTRAVENOUS | Status: DC

## 2018-11-16 NOTE — Op Note (Signed)
[facility] Patient Name: Scott Dodson Procedure Date: 11/16/2018 2:38 PM MRN: [id number] Endoscopist: Mallie Mussel L. Loletha Carrow , MD Age: 68 Referring MD:  Date of Birth: [birth year] Gender: Male Account #: [id number] Procedure:                Colonoscopy Indications:              Screening for colorectal malignant neoplasm, This                            is the patient's first colonoscopy Medicines:                Monitored Anesthesia Care Procedure:                Pre-Anesthesia Assessment:                           - Prior to the procedure, a History and Physical                            was performed, and patient medications and                            allergies were reviewed. The patient's tolerance of                            previous anesthesia was also reviewed. The risks                            and benefits of the procedure and the sedation                            options and risks were discussed with the patient.                            All questions were answered, and informed consent                            was obtained. Prior Anticoagulants: The patient has                            taken no previous anticoagulant or antiplatelet                            agents except for aspirin. ASA Grade Assessment:                            III - A patient with severe systemic disease. After                            reviewing the risks and benefits, the patient was                            deemed in satisfactory condition to undergo the  procedure.                           After obtaining informed consent, the colonoscope                            was passed under direct vision. Throughout the                            procedure, the patient's blood pressure, pulse, and                            oxygen saturations were monitored continuously. The                            Colonoscope was introduced through the anus and                       advanced to the the terminal ileum, with                            identification of the appendiceal orifice and IC                            valve. The colonoscopy was performed without                            difficulty. The patient tolerated the procedure                            well. The quality of the bowel preparation was good                            (after lavage). The terminal ileum, ileocecal                            valve, appendiceal orifice, and rectum were                            photographed. Scope In: 2:48:17 PM Scope Out: 3:02:14 PM Scope Withdrawal Time: 0 hours 10 minutes 57 seconds  Total Procedure Duration: 0 hours 13 minutes 57 seconds  Findings:                 The perianal and digital rectal examinations were                            normal.                           The terminal ileum appeared normal.                           A diminutive polyp was found in the ascending                            colon.  The polyp was sessile. The polyp was removed                            with a cold snare. Resection and retrieval were                            complete.                           The exam was otherwise without abnormality on                            direct and retroflexion views. Complications:            No immediate complications. Estimated Blood Loss:     Estimated blood loss was minimal. Impression:               - The examined portion of the ileum was normal.                           - One diminutive polyp in the ascending colon,                            removed with a cold snare. Resected and retrieved.                           - The examination was otherwise normal on direct                            and retroflexion views. Recommendation:           - Patient has a contact number available for                            emergencies. The signs and symptoms of potential                            delayed  complications were discussed with the                            patient. Return to normal activities tomorrow.                            Written discharge instructions were provided to the                            patient.                           - Resume previous diet.                           - Continue present medications.                           - Await pathology results.                           -  Repeat colonoscopy is recommended for                            surveillance. The colonoscopy date will be                            determined after pathology results from today's                            exam become available for review. Jami Ohlin L. Loletha Carrow, MD 11/16/2018 3:09:30 PM This report has been signed electronically.

## 2018-11-16 NOTE — Patient Instructions (Signed)
YOU HAD AN ENDOSCOPIC PROCEDURE TODAY AT [facility]:   Refer to the procedure report that was given to you for any specific questions about what was found during the examination.  If the procedure report does not answer your questions, please call your gastroenterologist to clarify.  If you requested that your care partner not be given the details of your procedure findings, then the procedure report has been included in a sealed envelope for you to review at your convenience later.  YOU SHOULD EXPECT: Some feelings of bloating in the abdomen. Passage of more gas than usual.  Walking can help get rid of the air that was put into your GI tract during the procedure and reduce the bloating. If you had a lower endoscopy (such as a colonoscopy or flexible sigmoidoscopy) you may notice spotting of blood in your stool or on the toilet paper. If you underwent a bowel prep for your procedure, you may not have a normal bowel movement for a few days.  Please Note:  You might notice some irritation and congestion in your nose or some drainage.  This is from the oxygen used during your procedure.  There is no need for concern and it should clear up in a day or so. Read all information given to you by your recovery room nurse. SYMPTOMS TO REPORT IMMEDIATELY:   Following lower endoscopy (colonoscopy or flexible sigmoidoscopy):  Excessive amounts of blood in the stool  Significant tenderness or worsening of abdominal pains  Swelling of the abdomen that is new, acute  Fever of 100F or higher  For urgent or emergent issues, a gastroenterologist can be reached at any hour by calling [phone].   DIET:  We do recommend a small meal at first, but then you may proceed to your regular diet.  Drink plenty of fluids but you should avoid alcoholic beverages for 24 hours.  ACTIVITY:  You should plan to take it easy for the rest of today and you should NOT DRIVE or use heavy machinery until tomorrow  (because of the sedation medicines used during the test).    FOLLOW UP: Our staff will call the number listed on your records 48-72 hours following your procedure to check on you and address any questions or concerns that you may have regarding the information given to you following your procedure. If we do not reach you, we will leave a message.  We will attempt to reach you two times.  During this call, we will ask if you have developed any symptoms of COVID 19. If you develop any symptoms (for example fever, flu-like symptoms, shortness of breath, cough etc.) before then, please call [phone].  If any biopsies were taken you will be contacted by phone or by letter within the next 1-3 weeks.  Please call us at [phone] if you have not heard about the biopsies in 3 weeks.    SIGNATURES/CONFIDENTIALITY: You and/or your care partner have signed paperwork which will be entered into your electronic medical record.  These signatures attest to the fact that that the information above on your After Visit Summary has been reviewed and is understood.  Full responsibility of the confidentiality of this discharge information lies with you and/or your care-partner.

## 2018-11-16 NOTE — Progress Notes (Signed)
Called to room to assist during endoscopic procedure.  Patient ID and intended procedure confirmed with present staff. Received instructions for my participation in the procedure from the performing physician.  

## 2018-11-16 NOTE — Progress Notes (Signed)
Report given to PACU, vss 

## 2018-11-16 NOTE — Telephone Encounter (Signed)
If he has no symptoms or known COVID contacts, then he can come for procedure as scheduled.  Thanks for checking.

## 2018-11-18 ENCOUNTER — Telehealth: Payer: Self-pay | Admitting: *Deleted

## 2018-11-18 NOTE — Telephone Encounter (Signed)
  Follow up Call-  Call back number 11/16/2018  Post procedure Call Back phone  # [phone]  Permission to leave phone message Yes  Some recent data might be hidden     Patient questions:  Do you have a fever, pain , or abdominal swelling? No. Pain Score  0 *  Have you tolerated food without any problems? Yes.    Have you been able to return to your normal activities? Yes.    Do you have any questions about your discharge instructions: Diet   No. Medications  No. Follow up visit  No.  Do you have questions or concerns about your Care? No.  Actions: * If pain score is 4 or above: No action needed, pain <4.   1. Have you developed a fever since your procedure? no  2.   Have you had an respiratory symptoms (SOB or cough) since your procedure? no  3.   Have you tested positive for COVID 19 since your procedure no  4.   Have you had any family members/close contacts diagnosed with the COVID 19 since your procedure?  no   If any of these questions are a yes, please inquire if patient has been seen by family doctor and route this note to Joylene John, Therapist, sports.

## 2018-11-24 ENCOUNTER — Encounter: Payer: Self-pay | Admitting: Gastroenterology

## 2018-11-27 ENCOUNTER — Other Ambulatory Visit: Payer: Self-pay | Admitting: Emergency Medicine

## 2018-11-27 DIAGNOSIS — I10 Essential (primary) hypertension: Secondary | ICD-10-CM

## 2018-12-01 LAB — HM DIABETES EYE EXAM

## 2018-12-03 ENCOUNTER — Encounter: Payer: Self-pay | Admitting: *Deleted

## 2018-12-08 ENCOUNTER — Encounter: Payer: Self-pay | Admitting: *Deleted

## 2018-12-19 ENCOUNTER — Other Ambulatory Visit: Payer: Self-pay | Admitting: Emergency Medicine

## 2018-12-19 DIAGNOSIS — E1165 Type 2 diabetes mellitus with hyperglycemia: Secondary | ICD-10-CM

## 2018-12-21 ENCOUNTER — Other Ambulatory Visit: Payer: Self-pay | Admitting: Emergency Medicine

## 2018-12-21 DIAGNOSIS — E1165 Type 2 diabetes mellitus with hyperglycemia: Secondary | ICD-10-CM

## 2019-02-09 ENCOUNTER — Ambulatory Visit (INDEPENDENT_AMBULATORY_CARE_PROVIDER_SITE_OTHER): Payer: Medicare Other | Admitting: Emergency Medicine

## 2019-02-09 ENCOUNTER — Encounter: Payer: Self-pay | Admitting: Emergency Medicine

## 2019-02-09 ENCOUNTER — Other Ambulatory Visit: Payer: Self-pay

## 2019-02-09 VITALS — BP 133/75 | HR 60 | Temp 98.5°F | Resp 16 | Ht 69.0 in | Wt 202.4 lb

## 2019-02-09 DIAGNOSIS — E1165 Type 2 diabetes mellitus with hyperglycemia: Secondary | ICD-10-CM

## 2019-02-09 DIAGNOSIS — I10 Essential (primary) hypertension: Secondary | ICD-10-CM | POA: Diagnosis not present

## 2019-02-09 DIAGNOSIS — I255 Ischemic cardiomyopathy: Secondary | ICD-10-CM

## 2019-02-09 DIAGNOSIS — G8929 Other chronic pain: Secondary | ICD-10-CM

## 2019-02-09 DIAGNOSIS — M25511 Pain in right shoulder: Secondary | ICD-10-CM

## 2019-02-09 DIAGNOSIS — E785 Hyperlipidemia, unspecified: Secondary | ICD-10-CM

## 2019-02-09 NOTE — Patient Instructions (Addendum)
   If you have lab work done today you will be contacted with your lab results within the next 2 weeks.  If you have not heard from us then please contact us. The fastest way to get your results is to register for My Chart.   IF you received an x-ray today, you will receive an invoice from [facility]. Please contact  Radiology at [phone] with questions or concerns regarding your invoice.   IF you received labwork today, you will receive an invoice from [facility]. Please contact [facility] at [phone] with questions or concerns regarding your invoice.   Our billing staff will not be able to assist you with questions regarding bills from these companies.  You will be contacted with the lab results as soon as they are available. The fastest way to get your results is to activate your My Chart account. Instructions are located on the last page of this paperwork. If you have not heard from us regarding the results in 2 weeks, please contact this office.     Diabetes Mellitus and Nutrition, Adult When you have diabetes (diabetes mellitus), it is very important to have healthy eating habits because your blood sugar (glucose) levels are greatly affected by what you eat and drink. Eating healthy foods in the appropriate amounts, at about the same times every day, can help you:  Control your blood glucose.  Lower your risk of heart disease.  Improve your blood pressure.  Reach or maintain a healthy weight. Every person with diabetes is different, and each person has different needs for a meal plan. Your health care provider may recommend that you work with a diet and nutrition specialist (dietitian) to make a meal plan that is best for you. Your meal plan may vary depending on factors such as:  The calories you need.  The medicines you take.  Your weight.  Your blood glucose, blood pressure, and cholesterol levels.  Your activity level.  Other health conditions  you have, such as heart or kidney disease. How do carbohydrates affect me? Carbohydrates, also called carbs, affect your blood glucose level more than any other type of food. Eating carbs naturally raises the amount of glucose in your blood. Carb counting is a method for keeping track of how many carbs you eat. Counting carbs is important to keep your blood glucose at a healthy level, especially if you use insulin or take certain oral diabetes medicines. It is important to know how many carbs you can safely have in each meal. This is different for every person. Your dietitian can help you calculate how many carbs you should have at each meal and for each snack. Foods that contain carbs include:  Bread, cereal, rice, pasta, and crackers.  Potatoes and corn.  Peas, beans, and lentils.  Milk and yogurt.  Fruit and juice.  Desserts, such as cakes, cookies, ice cream, and candy. How does alcohol affect me? Alcohol can cause a sudden decrease in blood glucose (hypoglycemia), especially if you use insulin or take certain oral diabetes medicines. Hypoglycemia can be a life-threatening condition. Symptoms of hypoglycemia (sleepiness, dizziness, and confusion) are similar to symptoms of having too much alcohol. If your health care provider says that alcohol is safe for you, follow these guidelines:  Limit alcohol intake to no more than 1 drink per day for nonpregnant women and 2 drinks per day for men. One drink equals 12 oz of beer, 5 oz of wine, or 1 oz of hard liquor.    Do not drink on an empty stomach.  Keep yourself hydrated with water, diet soda, or unsweetened iced tea.  Keep in mind that regular soda, juice, and other mixers may contain a lot of sugar and must be counted as carbs. What are tips for following this plan?  Reading food labels  Start by checking the serving size on the "Nutrition Facts" label of packaged foods and drinks. The amount of calories, carbs, fats, and other  nutrients listed on the label is based on one serving of the item. Many items contain more than one serving per package.  Check the total grams (g) of carbs in one serving. You can calculate the number of servings of carbs in one serving by dividing the total carbs by 15. For example, if a food has 30 g of total carbs, it would be equal to 2 servings of carbs.  Check the number of grams (g) of saturated and trans fats in one serving. Choose foods that have low or no amount of these fats.  Check the number of milligrams (mg) of salt (sodium) in one serving. Most people should limit total sodium intake to less than 2,300 mg per day.  Always check the nutrition information of foods labeled as "low-fat" or "nonfat". These foods may be higher in added sugar or refined carbs and should be avoided.  Talk to your dietitian to identify your daily goals for nutrients listed on the label. Shopping  Avoid buying canned, premade, or processed foods. These foods tend to be high in fat, sodium, and added sugar.  Shop around the outside edge of the grocery store. This includes fresh fruits and vegetables, bulk grains, fresh meats, and fresh dairy. Cooking  Use low-heat cooking methods, such as baking, instead of high-heat cooking methods like deep frying.  Cook using healthy oils, such as olive, canola, or sunflower oil.  Avoid cooking with butter, cream, or high-fat meats. Meal planning  Eat meals and snacks regularly, preferably at the same times every day. Avoid going long periods of time without eating.  Eat foods high in fiber, such as fresh fruits, vegetables, beans, and whole grains. Talk to your dietitian about how many servings of carbs you can eat at each meal.  Eat 4-6 ounces (oz) of lean protein each day, such as lean meat, chicken, fish, eggs, or tofu. One oz of lean protein is equal to: ? 1 oz of meat, chicken, or fish. ? 1 egg. ?  cup of tofu.  Eat some foods each day that contain  healthy fats, such as avocado, nuts, seeds, and fish. Lifestyle  Check your blood glucose regularly.  Exercise regularly as told by your health care provider. This may include: ? 150 minutes of moderate-intensity or vigorous-intensity exercise each week. This could be brisk walking, biking, or water aerobics. ? Stretching and doing strength exercises, such as yoga or weightlifting, at least 2 times a week.  Take medicines as told by your health care provider.  Do not use any products that contain nicotine or tobacco, such as cigarettes and e-cigarettes. If you need help quitting, ask your health care provider.  Work with a counselor or diabetes educator to identify strategies to manage stress and any emotional and social challenges. Questions to ask a health care provider  Do I need to meet with a diabetes educator?  Do I need to meet with a dietitian?  What number can I call if I have questions?  When are the best times to   check my blood glucose? Where to find more information:  American Diabetes Association: diabetes.org  Academy of Nutrition and Dietetics: www.eatright.[employer] of Diabetes and Digestive and Kidney Diseases (NIH): [employer] Summary  A healthy meal plan will help you control your blood glucose and maintain a healthy lifestyle.  Working with a diet and nutrition specialist (dietitian) can help you make a meal plan that is best for you.  Keep in mind that carbohydrates (carbs) and alcohol have immediate effects on your blood glucose levels. It is important to count carbs and to use alcohol carefully. This information is not intended to replace advice given to you by your health care provider. Make sure you discuss any questions you have with your health care provider. Document Released: 03/14/2005 Document Revised: 05/30/2017 Document Reviewed: 07/22/2016 Elsevier Patient Education  2020 [employer].  Hypertension, Adult High blood  pressure (hypertension) is when the force of blood pumping through the arteries is too strong. The arteries are the blood vessels that carry blood from the heart throughout the body. Hypertension forces the heart to work harder to pump blood and may cause arteries to become narrow or stiff. Untreated or uncontrolled hypertension can cause a heart attack, heart failure, a stroke, kidney disease, and other problems. A blood pressure reading consists of a higher number over a lower number. Ideally, your blood pressure should be below 120/80. The first ("top") number is called the systolic pressure. It is a measure of the pressure in your arteries as your heart beats. The second ("bottom") number is called the diastolic pressure. It is a measure of the pressure in your arteries as the heart relaxes. What are the causes? The exact cause of this condition is not known. There are some conditions that result in or are related to high blood pressure. What increases the risk? Some risk factors for high blood pressure are under your control. The following factors may make you more likely to develop this condition:  Smoking.  Having type 2 diabetes mellitus, high cholesterol, or both.  Not getting enough exercise or physical activity.  Being overweight.  Having too much fat, sugar, calories, or salt (sodium) in your diet.  Drinking too much alcohol. Some risk factors for high blood pressure may be difficult or impossible to change. Some of these factors include:  Having chronic kidney disease.  Having a family history of high blood pressure.  Age. Risk increases with age.  Race. You may be at higher risk if you are African American.  Gender. Men are at higher risk than women before age 82. After age 43, women are at higher risk than men.  Having obstructive sleep apnea.  Stress. What are the signs or symptoms? High blood pressure may not cause symptoms. Very high blood pressure (hypertensive  crisis) may cause:  Headache.  Anxiety.  Shortness of breath.  Nosebleed.  Nausea and vomiting.  Vision changes.  Severe chest pain.  Seizures. How is this diagnosed? This condition is diagnosed by measuring your blood pressure while you are seated, with your arm resting on a flat surface, your legs uncrossed, and your feet flat on the floor. The cuff of the blood pressure monitor will be placed directly against the skin of your upper arm at the level of your heart. It should be measured at least twice using the same arm. Certain conditions can cause a difference in blood pressure between your right and left arms. Certain factors can cause blood pressure readings to be  lower or higher than normal for a short period of time:  When your blood pressure is higher when you are in a health care provider's office than when you are at home, this is called white coat hypertension. Most people with this condition do not need medicines.  When your blood pressure is higher at home than when you are in a health care provider's office, this is called masked hypertension. Most people with this condition may need medicines to control blood pressure. If you have a high blood pressure reading during one visit or you have normal blood pressure with other risk factors, you may be asked to:  Return on a different day to have your blood pressure checked again.  Monitor your blood pressure at home for 1 week or longer. If you are diagnosed with hypertension, you may have other blood or imaging tests to help your health care provider understand your overall risk for other conditions. How is this treated? This condition is treated by making healthy lifestyle changes, such as eating healthy foods, exercising more, and reducing your alcohol intake. Your health care provider may prescribe medicine if lifestyle changes are not enough to get your blood pressure under control, and if:  Your systolic blood pressure  is above 130.  Your diastolic blood pressure is above 80. Your personal target blood pressure may vary depending on your medical conditions, your age, and other factors. Follow these instructions at home: Eating and drinking   Eat a diet that is high in fiber and potassium, and low in sodium, added sugar, and fat. An example eating plan is called the DASH (Dietary Approaches to Stop Hypertension) diet. To eat this way: ? Eat plenty of fresh fruits and vegetables. Try to fill one half of your plate at each meal with fruits and vegetables. ? Eat whole grains, such as whole-wheat pasta, brown rice, or whole-grain bread. Fill about one fourth of your plate with whole grains. ? Eat or drink low-fat dairy products, such as skim milk or low-fat yogurt. ? Avoid fatty cuts of meat, processed or cured meats, and poultry with skin. Fill about one fourth of your plate with lean proteins, such as fish, chicken without skin, beans, eggs, or tofu. ? Avoid pre-made and processed foods. These tend to be higher in sodium, added sugar, and fat.  Reduce your daily sodium intake. Most people with hypertension should eat less than 1,500 mg of sodium a day.  Do not drink alcohol if: ? Your health care provider tells you not to drink. ? You are pregnant, may be pregnant, or are planning to become pregnant.  If you drink alcohol: ? Limit how much you use to:  0-1 drink a day for women.  0-2 drinks a day for men. ? Be aware of how much alcohol is in your drink. In the [place] one drink equals one 12 oz bottle of beer (355 mL), one 5 oz glass of wine (148 mL), or one 1 oz glass of hard liquor (44 mL). Lifestyle   Work with your health care provider to maintain a healthy body weight or to lose weight. Ask what an ideal weight is for you.  Get at least 30 minutes of exercise most days of the week. Activities may include walking, swimming, or biking.  Include exercise to strengthen your muscles (resistance  exercise), such as Pilates or lifting weights, as part of your weekly exercise routine. Try to do these types of exercises for 30 minutes at least  3 days a week.  Do not use any products that contain nicotine or tobacco, such as cigarettes, e-cigarettes, and chewing tobacco. If you need help quitting, ask your health care provider.  Monitor your blood pressure at home as told by your health care provider.  Keep all follow-up visits as told by your health care provider. This is important. Medicines  Take over-the-counter and prescription medicines only as told by your health care provider. Follow directions carefully. Blood pressure medicines must be taken as prescribed.  Do not skip doses of blood pressure medicine. Doing this puts you at risk for problems and can make the medicine less effective.  Ask your health care provider about side effects or reactions to medicines that you should watch for. Contact a health care provider if you:  Think you are having a reaction to a medicine you are taking.  Have headaches that keep coming back (recurring).  Feel dizzy.  Have swelling in your ankles.  Have trouble with your vision. Get help right away if you:  Develop a severe headache or confusion.  Have unusual weakness or numbness.  Feel faint.  Have severe pain in your chest or abdomen.  Vomit repeatedly.  Have trouble breathing. Summary  Hypertension is when the force of blood pumping through your arteries is too strong. If this condition is not controlled, it may put you at risk for serious complications.  Your personal target blood pressure may vary depending on your medical conditions, your age, and other factors. For most people, a normal blood pressure is less than 120/80.  Hypertension is treated with lifestyle changes, medicines, or a combination of both. Lifestyle changes include losing weight, eating a healthy, low-sodium diet, exercising more, and limiting  alcohol. This information is not intended to replace advice given to you by your health care provider. Make sure you discuss any questions you have with your health care provider. Document Released: 06/17/2005 Document Revised: 02/25/2018 Document Reviewed: 02/25/2018 Elsevier Patient Education  2020 [employer].

## 2019-02-09 NOTE — Progress Notes (Signed)
Lab Results  Component Value Date   HGBA1C 6.2 (A) 08/12/2018   BP Readings from Last 3 Encounters:  02/09/19 133/75  11/16/18 (!) 155/100  10/14/18 138/72   Lab Results  Component Value Date   CHOL 222 (H) 08/12/2018   HDL 53 08/12/2018   LDLCALC 113 (H) 08/12/2018   LDLDIRECT 74.6 08/11/2012   TRIG 280 (H) 08/12/2018   CHOLHDL 4.2 08/12/2018   Irven Shelling 68 y.o.   Chief Complaint  Patient presents with  . Diabetes    follow up 6 months  . Hypertension    HISTORY OF PRESENT ILLNESS: This is a 68 y.o. male with the several chronic medical problems here for follow-up: 1.  Ischemic cardiomyopathy/coronary artery disease/status post MI in the past: Doing well.  No chest pain or dyspnea on exertion. 2.  Diabetes: On glipizide and metformin.  Doing well.  Has no complaints.  Does not check sugars at home. 3.  Hypertension: On amlodipine, lisinopril, and carvedilol.  Doing well.  Blood pressures at home within normal limits. 4.  Right shoulder injury last January with ongoing pain.  No improvement.  Has not seen orthopedist yet.  Will need a referral. 5.  Dyslipidemia.  Abnormal lipid profile during last check.  Was started on Crestor during last visit.  Fasting today.  We will do labs. Has no other complaints or medical concerns today. Health maintenance update: Had both colonoscopy and diabetic eye exam this year.  Normal eye exam, no diabetic retinopathy.  Colonoscopy revealed 1 polyp, follow-up in 7 years. HPI   Prior to Admission medications   Medication Sig Start Date End Date Taking? Authorizing Provider  ALBUTEROL IN Inhale into the lungs.   Yes [provider]  amLODipine (NORVASC) 5 MG tablet TAKE 1 TABLET BY MOUTH EVERY DAY 10/30/18  Yes Israel Wunder, Ines Bloomer, MD  Ascorbic Acid (VITAMIN C) 1000 MG tablet Take 2,000 mg by mouth daily.   Yes [provider]  aspirin 81 MG tablet Take 81 mg by mouth daily.   Yes [provider]  cetirizine  (ZYRTEC ALLERGY) 10 MG tablet Take 1 tablet (10 mg total) by mouth daily. 10/13/17  Yes Jaynee Eagles, PA-C  Coenzyme Q10 (CO Q 10 PO) Take by mouth.   Yes [provider]  fluticasone (FLONASE) 50 MCG/ACT nasal spray Place 2 sprays into both nostrils daily. 10/13/17  Yes Jaynee Eagles, PA-C  glipiZIDE (GLUCOTROL) 5 MG tablet TAKE 1 TABLET BY MOUTH  DAILY BEFORE BREAKFAST 12/21/18  Yes Johnedward Brodrick, Ines Bloomer, MD  lisinopril (PRINIVIL,ZESTRIL) 40 MG tablet Take 1 tablet (40 mg total) by mouth daily. 06/01/18  Yes Ruth Kovich, Ines Bloomer, MD  metFORMIN (GLUCOPHAGE) 500 MG tablet TAKE 1 TABLET BY MOUTH TWO  TIMES DAILY WITH A MEAL 12/22/18  Yes Deeya Richeson, Ines Bloomer, MD  rosuvastatin (CRESTOR) 20 MG tablet Take 1 tablet (20 mg total) by mouth daily. 08/13/18  Yes Abrial Arrighi, Ines Bloomer, MD  carvedilol (COREG) 25 MG tablet Take 1 tablet (25 mg total) by mouth 2 (two) times daily with a meal. Use previous signature 08/12/18 11/16/18  Horald Pollen, MD    No Known Allergies  Patient Active Problem List   Diagnosis Date Noted  . History of diabetes mellitus 02/09/2018  . CARDIOMYOPATHY, ISCHEMIC 05/18/2010  . CORONARY ARTERY DISEASE 10/11/2009  . DIABETES MELLITUS, TYPE II 10/09/2009  . HYPERLIPIDEMIA 10/09/2009  . Essential hypertension 10/09/2009  . MYOCARDIAL INFARCTION, HX OF 10/09/2009    Past Medical History:  Diagnosis Date  . Allergy   . Asthma   . Coronary artery disease    s/p lateral MI 4 /11, s/p Promus DES OM2  . Diabetes mellitus without complication ([place])   . Heart murmur   . Hyperlipidemia   . Hypertension   . Myocardial infarct [facility]) 2011    Past Surgical History:  Procedure Laterality Date  . ADENOIDECTOMY    . CORONARY ARTERY BYPASS GRAFT  2011  . CORONARY STENT PLACEMENT    . TONSILLECTOMY    . TYMPANIC MEMBRANE REPAIR Right 2004  . VASECTOMY      Social History   Socioeconomic History  . Marital status: Married    Spouse name: Not on file  . Number of  children: 3  . Years of education: Not on file  . Highest education level: Not on file  Occupational History  . Occupation: Charity fundraiser  . Occupation: retired  Scientific laboratory technician  . Financial resource strain: Not hard at all  . Food insecurity    Worry: Never true    Inability: Never true  . Transportation needs    Medical: No    Non-medical: No  Tobacco Use  . Smoking status: Former Smoker    Packs/day: 0.50    Years: 5.00    Pack years: 2.50    Types: Cigarettes    Quit date: 10/14/1974    Years since quitting: 44.3  . Smokeless tobacco: Never Used  Substance and Sexual Activity  . Alcohol use: Yes    Alcohol/week: 5.0 standard drinks    Types: 5 Standard drinks or equivalent per week  . Drug use: No  . Sexual activity: Yes  Lifestyle  . Physical activity    Days per week: 3 days    Minutes per session: 100 min  . Stress: Not at all  Relationships  . Social Herbalist on phone: Once a week    Gets together: Once a week    Attends religious service: More than 4 times per year    Active member of club or organization: Yes    Attends meetings of clubs or organizations: 1 to 4 times per year    Relationship status: Married  . Intimate partner violence    Fear of current or ex partner: No    Emotionally abused: No    Physically abused: No    Forced sexual activity: No  Other Topics Concern  . Not on file  Social History Narrative   Occupation- Photographer    Former -tobacco -stopped 1979   Married ,3 children     Alcohol use - yes , social    Drug use - no   Regular exercise- yes     Family History  Problem Relation Age of Onset  . Heart disease Mother   . Hypertension Mother   . Heart disease Father   . Hypertension Father   . Heart disease Brother   . Heart disease Sister   . Colon cancer Maternal Grandfather 58  . Colon polyps Neg Hx   . Esophageal cancer Neg Hx   . Stomach cancer Neg Hx   . Rectal cancer Neg Hx       Review of Systems  Constitutional: Negative.  Negative for chills and fever.  HENT: Negative.   Eyes: Negative.   Respiratory: Negative.  Negative for cough and shortness of breath.   Cardiovascular: Negative.  Negative for chest pain, palpitations, orthopnea, leg swelling  and PND.  Gastrointestinal: Negative.  Negative for abdominal pain, diarrhea, nausea and vomiting.  Genitourinary: Negative.   Musculoskeletal: Positive for joint pain (Right shoulder).  Skin: Negative.  Negative for rash.  Neurological: Negative.  Negative for dizziness and headaches.  Endo/Heme/Allergies: Negative.   All other systems reviewed and are negative.    Physical Exam Vitals signs reviewed.  Constitutional:      Appearance: Normal appearance.  HENT:     Head: Normocephalic and atraumatic.  Eyes:     Extraocular Movements: Extraocular movements intact.     Conjunctiva/sclera: Conjunctivae normal.     Pupils: Pupils are equal, round, and reactive to light.  Neck:     Musculoskeletal: Normal range of motion and neck supple.  Cardiovascular:     Rate and Rhythm: Normal rate and regular rhythm.     Pulses: Normal pulses.     Heart sounds: Normal heart sounds.  Pulmonary:     Effort: Pulmonary effort is normal.     Breath sounds: Normal breath sounds.  Abdominal:     Palpations: Abdomen is soft.     Tenderness: There is no abdominal tenderness.  Musculoskeletal: Normal range of motion.     Right lower leg: No edema.     Left lower leg: No edema.  Lymphadenopathy:     Cervical: No cervical adenopathy.  Skin:    General: Skin is warm and dry.     Capillary Refill: Capillary refill takes less than 2 seconds.  Neurological:     General: No focal deficit present.     Mental Status: He is alert and oriented to person, place, and time.      ASSESSMENT & PLAN: Saiquan was seen today for diabetes and hypertension.  Diagnoses and all orders for this visit:  Type 2 diabetes mellitus with  hyperglycemia, without long-term current use of insulin (HCC) -     CBC with Differential/Platelet -     Comprehensive metabolic panel -     Lipid panel -     Hemoglobin A1c  Essential hypertension -     CBC with Differential/Platelet -     Comprehensive metabolic panel -     Lipid panel -     Hemoglobin A1c  Chronic right shoulder pain -     Ambulatory referral to Orthopedic Surgery  Cardiomyopathy, ischemic  Dyslipidemia    Patient Instructions       If you have lab work done today you will be contacted with your lab results within the next 2 weeks.  If you have not heard from [place] then please contact us. The fastest way to get your results is to register for My Chart.   IF you received an x-ray today, you will receive an invoice from [facility]. Please contact [facility] at [phone] with questions or concerns regarding your invoice.   IF you received labwork today, you will receive an invoice from [facility]. Please contact [facility] at [phone] with questions or concerns regarding your invoice.   Our billing staff will not be able to assist you with questions regarding bills from these companies.  You will be contacted with the lab results as soon as they are available. The fastest way to get your results is to activate your My Chart account. Instructions are located on the last page of this paperwork. If you have not heard from [place] regarding the results in 2 weeks, please contact this office.     Diabetes Mellitus and Nutrition, Adult When  you have diabetes (diabetes mellitus), it is very important to have healthy eating habits because your blood sugar (glucose) levels are greatly affected by what you eat and drink. Eating healthy foods in the appropriate amounts, at about the same times every day, can help you:  Control your blood glucose.  Lower your risk of heart disease.  Improve your blood pressure.  Reach or maintain a healthy weight.  Every person with diabetes is different, and each person has different needs for a meal plan. Your health care provider may recommend that you work with a diet and nutrition specialist (dietitian) to make a meal plan that is best for you. Your meal plan may vary depending on factors such as:  The calories you need.  The medicines you take.  Your weight.  Your blood glucose, blood pressure, and cholesterol levels.  Your activity level.  Other health conditions you have, such as heart or kidney disease. How do carbohydrates affect me? Carbohydrates, also called carbs, affect your blood glucose level more than any other type of food. Eating carbs naturally raises the amount of glucose in your blood. Carb counting is a method for keeping track of how many carbs you eat. Counting carbs is important to keep your blood glucose at a healthy level, especially if you use insulin or take certain oral diabetes medicines. It is important to know how many carbs you can safely have in each meal. This is different for every person. Your dietitian can help you calculate how many carbs you should have at each meal and for each snack. Foods that contain carbs include:  Bread, cereal, rice, pasta, and crackers.  Potatoes and corn.  Peas, beans, and lentils.  Milk and yogurt.  Fruit and juice.  Desserts, such as cakes, cookies, ice cream, and candy. How does alcohol affect me? Alcohol can cause a sudden decrease in blood glucose (hypoglycemia), especially if you use insulin or take certain oral diabetes medicines. Hypoglycemia can be a life-threatening condition. Symptoms of hypoglycemia (sleepiness, dizziness, and confusion) are similar to symptoms of having too much alcohol. If your health care provider says that alcohol is safe for you, follow these guidelines:  Limit alcohol intake to no more than 1 drink per day for nonpregnant women and 2 drinks per day for men. One drink equals 12 oz of beer, 5  oz of wine, or 1 oz of hard liquor.  Do not drink on an empty stomach.  Keep yourself hydrated with water, diet soda, or unsweetened iced tea.  Keep in mind that regular soda, juice, and other mixers may contain a lot of sugar and must be counted as carbs. What are tips for following this plan?  Reading food labels  Start by checking the serving size on the "Nutrition Facts" label of packaged foods and drinks. The amount of calories, carbs, fats, and other nutrients listed on the label is based on one serving of the item. Many items contain more than one serving per package.  Check the total grams (g) of carbs in one serving. You can calculate the number of servings of carbs in one serving by dividing the total carbs by 15. For example, if a food has 30 g of total carbs, it would be equal to 2 servings of carbs.  Check the number of grams (g) of saturated and trans fats in one serving. Choose foods that have low or no amount of these fats.  Check the number of milligrams (mg) of salt (sodium)  in one serving. Most people should limit total sodium intake to less than 2,300 mg per day.  Always check the nutrition information of foods labeled as "low-fat" or "nonfat". These foods may be higher in added sugar or refined carbs and should be avoided.  Talk to your dietitian to identify your daily goals for nutrients listed on the label. Shopping  Avoid buying canned, premade, or processed foods. These foods tend to be high in fat, sodium, and added sugar.  Shop around the outside edge of the grocery store. This includes fresh fruits and vegetables, bulk grains, fresh meats, and fresh dairy. Cooking  Use low-heat cooking methods, such as baking, instead of high-heat cooking methods like deep frying.  Cook using healthy oils, such as olive, canola, or sunflower oil.  Avoid cooking with butter, cream, or high-fat meats. Meal planning  Eat meals and snacks regularly, preferably at the same  times every day. Avoid going long periods of time without eating.  Eat foods high in fiber, such as fresh fruits, vegetables, beans, and whole grains. Talk to your dietitian about how many servings of carbs you can eat at each meal.  Eat 4-6 ounces (oz) of lean protein each day, such as lean meat, chicken, fish, eggs, or tofu. One oz of lean protein is equal to: ? 1 oz of meat, chicken, or fish. ? 1 egg. ?  cup of tofu.  Eat some foods each day that contain healthy fats, such as avocado, nuts, seeds, and fish. Lifestyle  Check your blood glucose regularly.  Exercise regularly as told by your health care provider. This may include: ? 150 minutes of moderate-intensity or vigorous-intensity exercise each week. This could be brisk walking, biking, or water aerobics. ? Stretching and doing strength exercises, such as yoga or weightlifting, at least 2 times a week.  Take medicines as told by your health care provider.  Do not use any products that contain nicotine or tobacco, such as cigarettes and e-cigarettes. If you need help quitting, ask your health care provider.  Work with a Social worker or diabetes educator to identify strategies to manage stress and any emotional and social challenges. Questions to ask a health care provider  Do I need to meet with a diabetes educator?  Do I need to meet with a dietitian?  What number can I call if I have questions?  When are the best times to check my blood glucose? Where to find more information:  American Diabetes Association: diabetes.org  Academy of Nutrition and Dietetics: www.eatright.[employer] of Diabetes and Digestive and Kidney Diseases (NIH): [employer] Summary  A healthy meal plan will help you control your blood glucose and maintain a healthy lifestyle.  Working with a diet and nutrition specialist (dietitian) can help you make a meal plan that is best for you.  Keep in mind that carbohydrates (carbs)  and alcohol have immediate effects on your blood glucose levels. It is important to count carbs and to use alcohol carefully. This information is not intended to replace advice given to you by your health care provider. Make sure you discuss any questions you have with your health care provider. Document Released: 03/14/2005 Document Revised: 05/30/2017 Document Reviewed: 07/22/2016 Elsevier Patient Education  2020 [employer].  Hypertension, Adult High blood pressure (hypertension) is when the force of blood pumping through the arteries is too strong. The arteries are the blood vessels that carry blood from the heart throughout the body. Hypertension forces the heart to work  harder to pump blood and may cause arteries to become narrow or stiff. Untreated or uncontrolled hypertension can cause a heart attack, heart failure, a stroke, kidney disease, and other problems. A blood pressure reading consists of a higher number over a lower number. Ideally, your blood pressure should be below 120/80. The first ("top") number is called the systolic pressure. It is a measure of the pressure in your arteries as your heart beats. The second ("bottom") number is called the diastolic pressure. It is a measure of the pressure in your arteries as the heart relaxes. What are the causes? The exact cause of this condition is not known. There are some conditions that result in or are related to high blood pressure. What increases the risk? Some risk factors for high blood pressure are under your control. The following factors may make you more likely to develop this condition:  Smoking.  Having type 2 diabetes mellitus, high cholesterol, or both.  Not getting enough exercise or physical activity.  Being overweight.  Having too much fat, sugar, calories, or salt (sodium) in your diet.  Drinking too much alcohol. Some risk factors for high blood pressure may be difficult or impossible to change. Some of these  factors include:  Having chronic kidney disease.  Having a family history of high blood pressure.  Age. Risk increases with age.  Race. You may be at higher risk if you are African American.  Gender. Men are at higher risk than women before age 56. After age 12, women are at higher risk than men.  Having obstructive sleep apnea.  Stress. What are the signs or symptoms? High blood pressure may not cause symptoms. Very high blood pressure (hypertensive crisis) may cause:  Headache.  Anxiety.  Shortness of breath.  Nosebleed.  Nausea and vomiting.  Vision changes.  Severe chest pain.  Seizures. How is this diagnosed? This condition is diagnosed by measuring your blood pressure while you are seated, with your arm resting on a flat surface, your legs uncrossed, and your feet flat on the floor. The cuff of the blood pressure monitor will be placed directly against the skin of your upper arm at the level of your heart. It should be measured at least twice using the same arm. Certain conditions can cause a difference in blood pressure between your right and left arms. Certain factors can cause blood pressure readings to be lower or higher than normal for a short period of time:  When your blood pressure is higher when you are in a health care provider's office than when you are at home, this is called white coat hypertension. Most people with this condition do not need medicines.  When your blood pressure is higher at home than when you are in a health care provider's office, this is called masked hypertension. Most people with this condition may need medicines to control blood pressure. If you have a high blood pressure reading during one visit or you have normal blood pressure with other risk factors, you may be asked to:  Return on a different day to have your blood pressure checked again.  Monitor your blood pressure at home for 1 week or longer. If you are diagnosed with  hypertension, you may have other blood or imaging tests to help your health care provider understand your overall risk for other conditions. How is this treated? This condition is treated by making healthy lifestyle changes, such as eating healthy foods, exercising more, and reducing your alcohol  intake. Your health care provider may prescribe medicine if lifestyle changes are not enough to get your blood pressure under control, and if:  Your systolic blood pressure is above 130.  Your diastolic blood pressure is above 80. Your personal target blood pressure may vary depending on your medical conditions, your age, and other factors. Follow these instructions at home: Eating and drinking   Eat a diet that is high in fiber and potassium, and low in sodium, added sugar, and fat. An example eating plan is called the DASH (Dietary Approaches to Stop Hypertension) diet. To eat this way: ? Eat plenty of fresh fruits and vegetables. Try to fill one half of your plate at each meal with fruits and vegetables. ? Eat whole grains, such as whole-wheat pasta, brown rice, or whole-grain bread. Fill about one fourth of your plate with whole grains. ? Eat or drink low-fat dairy products, such as skim milk or low-fat yogurt. ? Avoid fatty cuts of meat, processed or cured meats, and poultry with skin. Fill about one fourth of your plate with lean proteins, such as fish, chicken without skin, beans, eggs, or tofu. ? Avoid pre-made and processed foods. These tend to be higher in sodium, added sugar, and fat.  Reduce your daily sodium intake. Most people with hypertension should eat less than 1,500 mg of sodium a day.  Do not drink alcohol if: ? Your health care provider tells you not to drink. ? You are pregnant, may be pregnant, or are planning to become pregnant.  If you drink alcohol: ? Limit how much you use to:  0-1 drink a day for women.  0-2 drinks a day for men. ? Be aware of how much alcohol is in  your drink. In the [place] one drink equals one 12 oz bottle of beer (355 mL), one 5 oz glass of wine (148 mL), or one 1 oz glass of hard liquor (44 mL). Lifestyle   Work with your health care provider to maintain a healthy body weight or to lose weight. Ask what an ideal weight is for you.  Get at least 30 minutes of exercise most days of the week. Activities may include walking, swimming, or biking.  Include exercise to strengthen your muscles (resistance exercise), such as Pilates or lifting weights, as part of your weekly exercise routine. Try to do these types of exercises for 30 minutes at least 3 days a week.  Do not use any products that contain nicotine or tobacco, such as cigarettes, e-cigarettes, and chewing tobacco. If you need help quitting, ask your health care provider.  Monitor your blood pressure at home as told by your health care provider.  Keep all follow-up visits as told by your health care provider. This is important. Medicines  Take over-the-counter and prescription medicines only as told by your health care provider. Follow directions carefully. Blood pressure medicines must be taken as prescribed.  Do not skip doses of blood pressure medicine. Doing this puts you at risk for problems and can make the medicine less effective.  Ask your health care provider about side effects or reactions to medicines that you should watch for. Contact a health care provider if you:  Think you are having a reaction to a medicine you are taking.  Have headaches that keep coming back (recurring).  Feel dizzy.  Have swelling in your ankles.  Have trouble with your vision. Get help right away if you:  Develop a severe headache or confusion.  Have  unusual weakness or numbness.  Feel faint.  Have severe pain in your chest or abdomen.  Vomit repeatedly.  Have trouble breathing. Summary  Hypertension is when the force of blood pumping through your arteries is too strong.  If this condition is not controlled, it may put you at risk for serious complications.  Your personal target blood pressure may vary depending on your medical conditions, your age, and other factors. For most people, a normal blood pressure is less than 120/80.  Hypertension is treated with lifestyle changes, medicines, or a combination of both. Lifestyle changes include losing weight, eating a healthy, low-sodium diet, exercising more, and limiting alcohol. This information is not intended to replace advice given to you by your health care provider. Make sure you discuss any questions you have with your health care provider. Document Released: 06/17/2005 Document Revised: 02/25/2018 Document Reviewed: 02/25/2018 Elsevier Patient Education  2020 Elsevier Inc.      Agustina Caroli, MD [facility]

## 2019-02-10 ENCOUNTER — Encounter: Payer: Self-pay | Admitting: Emergency Medicine

## 2019-02-10 LAB — COMPREHENSIVE METABOLIC PANEL
ALT: 27 IU/L (ref 0–44)
AST: 20 IU/L (ref 0–40)
Albumin/Globulin Ratio: 2.4 — ABNORMAL HIGH (ref 1.2–2.2)
Albumin: 4.8 g/dL (ref 3.8–4.8)
Alkaline Phosphatase: 61 IU/L (ref 39–117)
BUN/Creatinine Ratio: 12 (ref 10–24)
BUN: 14 mg/dL (ref 8–27)
Bilirubin Total: 0.3 mg/dL (ref 0.0–1.2)
CO2: 20 mmol/L (ref 20–29)
Calcium: 9.2 mg/dL (ref 8.6–10.2)
Chloride: 105 mmol/L (ref 96–106)
Creatinine, Ser: 1.18 mg/dL (ref 0.76–1.27)
GFR calc Af Amer: 74 mL/min/{1.73_m2} (ref >59)
GFR calc non Af Amer: 64 mL/min/{1.73_m2} (ref >59)
Globulin, Total: 2 g/dL (ref 1.5–4.5)
Glucose: 146 mg/dL — ABNORMAL HIGH (ref 65–99)
Potassium: 4.4 mmol/L (ref 3.5–5.2)
Sodium: 143 mmol/L (ref 134–144)
Total Protein: 6.8 g/dL (ref 6.0–8.5)

## 2019-02-10 LAB — LIPID PANEL
Chol/HDL Ratio: 2.7 ratio (ref 0.0–5.0)
Cholesterol, Total: 136 mg/dL (ref 100–199)
HDL: 51 mg/dL (ref >39)
LDL Calculated: 52 mg/dL (ref 0–99)
Triglycerides: 166 mg/dL — ABNORMAL HIGH (ref 0–149)
VLDL Cholesterol Cal: 33 mg/dL (ref 5–40)

## 2019-02-10 LAB — HEMOGLOBIN A1C
Est. average glucose Bld gHb Est-mCnc: 157 mg/dL
Hgb A1c MFr Bld: 7.1 % — ABNORMAL HIGH (ref 4.8–5.6)

## 2019-02-10 LAB — CBC WITH DIFFERENTIAL/PLATELET
Basophils Absolute: 0.1 10*3/uL (ref 0.0–0.2)
Basos: 1 %
EOS (ABSOLUTE): 0.3 10*3/uL (ref 0.0–0.4)
Eos: 5 %
Hematocrit: 45.6 % (ref 37.5–51.0)
Hemoglobin: 15.1 g/dL (ref 13.0–17.7)
Immature Grans (Abs): 0 10*3/uL (ref 0.0–0.1)
Immature Granulocytes: 0 %
Lymphocytes Absolute: 1.6 10*3/uL (ref 0.7–3.1)
Lymphs: 27 %
MCH: 28.2 pg (ref 26.6–33.0)
MCHC: 33.1 g/dL (ref 31.5–35.7)
MCV: 85 fL (ref 79–97)
Monocytes Absolute: 0.5 10*3/uL (ref 0.1–0.9)
Monocytes: 9 %
Neutrophils Absolute: 3.3 10*3/uL (ref 1.4–7.0)
Neutrophils: 58 %
Platelets: 165 10*3/uL (ref 150–450)
RBC: 5.36 x10E6/uL (ref 4.14–5.80)
RDW: 12.4 % (ref 11.6–15.4)
WBC: 5.7 10*3/uL (ref 3.4–10.8)

## 2019-02-16 ENCOUNTER — Ambulatory Visit (INDEPENDENT_AMBULATORY_CARE_PROVIDER_SITE_OTHER): Payer: Medicare Other | Admitting: Orthopaedic Surgery

## 2019-02-16 ENCOUNTER — Other Ambulatory Visit: Payer: Self-pay

## 2019-02-16 ENCOUNTER — Encounter: Payer: Self-pay | Admitting: Orthopaedic Surgery

## 2019-02-16 ENCOUNTER — Ambulatory Visit: Payer: Self-pay

## 2019-02-16 DIAGNOSIS — M25511 Pain in right shoulder: Secondary | ICD-10-CM | POA: Diagnosis not present

## 2019-02-16 DIAGNOSIS — G8929 Other chronic pain: Secondary | ICD-10-CM

## 2019-02-16 DIAGNOSIS — M7541 Impingement syndrome of right shoulder: Secondary | ICD-10-CM | POA: Diagnosis not present

## 2019-02-16 MED ORDER — LIDOCAINE HCL 1 % IJ SOLN
3.0000 mL | INTRAMUSCULAR | Status: AC | PRN
  Administered 2019-02-16: 3 mL

## 2019-02-16 MED ORDER — METHYLPREDNISOLONE ACETATE 40 MG/ML IJ SUSP
40.0000 mg | INTRAMUSCULAR | Status: AC | PRN
  Administered 2019-02-16: 09:00:00 40 mg via INTRA_ARTICULAR

## 2019-02-16 NOTE — Progress Notes (Signed)
Office Visit Note   Patient: Scott Dodson           Date of Birth: August 25, 1950           MRN: [id number] Visit Date: 02/16/2019              Requested by: Horald Pollen, MD [place],   [id number] PCP: Horald Pollen, MD   Assessment & Plan: Visit Diagnoses:  1. Chronic right shoulder pain   2. Impingement syndrome of right shoulder     Plan: He does exhibit signs of classic right shoulder impingement syndrome.  I showed him a shoulder model and his x-rays and explained in detail what this involves.  I did recommend a steroid injection.  Since his blood sugars are not out of control I feel this is appropriate.  I did explain how this may detrimentally affect his blood glucose and the risk and benefits of steroid injections.  I then provided 1 in the right shoulder subacromial space which he tolerated well.  He did get pain relief afterwards.  All question concerns were answered and addressed.  Follow-up will be as needed.  He knows that I would certainly want to try repeat injections in about 3 months only if needed.  Follow-Up Instructions: Return if symptoms worsen or fail to improve.   Orders:  Orders Placed This Encounter  Procedures  . Large Joint Inj  . XR Shoulder Right   No orders of the defined types were placed in this encounter.     Procedures: Large Joint Inj: R subacromial bursa on 02/16/2019 8:40 AM Indications: pain and diagnostic evaluation Details: 22 G 1.5 in needle  Arthrogram: No  Medications: 3 mL lidocaine 1 %; 40 mg methylPREDNISolone acetate 40 MG/ML Outcome: tolerated well, no immediate complications Procedure, treatment alternatives, risks and benefits explained, specific risks discussed. Consent was given by the patient. Immediately prior to procedure a time out was called to verify the correct patient, procedure, equipment, support staff and site/side marked as required. Patient was prepped and draped in the usual  sterile fashion.       Clinical Data: No additional findings.   Subjective: Chief Complaint  Patient presents with  . Right Shoulder - Pain  Patient is a very pleasant and active 68 year old who reports right shoulder pain since January of this year.  He slipped on a wet porch and fell catching himself has had some right arm pain with decreased motion and stiffness since then.  It does wake him up at night.  His right shoulder hurts with overhead activities and reaching behind him.  He is a diabetic but reports good control.  He is never injured this shoulder before.  He denies any neck pain.  He denies any numbness and tingling in his right hand.  He is right-hand dominant.  HPI  Review of Systems He currently denies any headache, chest pain, short of breath, fever, chills, nausea, vomiting  Objective: Vital Signs: There were no vitals taken for this visit.  Physical Exam He is alert and orient x3 and in no acute distress Ortho Exam Examination of his right shoulder shows full and fluid range of motion except for internal rotation with adduction.  Reaching behind the knee can only get to the upper lumbar spine where he is further on his nonpainful left side.  His external rotation is full.  He has good strength with rotator cuff and does not use his deltoid to abduct his  shoulder is much is his rotator cuff.  He does have positive Neer and Hawkins signs. Specialty Comments:  No specialty comments available.  Imaging: Xr Shoulder Right  Result Date: 02/16/2019 3 views of the right shoulder show well located shoulder with no acute findings.  There is slight changes of arthritis at the [facility] joint.    PMFS History: Patient Active Problem List   Diagnosis Date Noted  . Impingement syndrome of right shoulder 02/16/2019  . History of diabetes mellitus 02/09/2018  . CARDIOMYOPATHY, ISCHEMIC 05/18/2010  . CORONARY ARTERY DISEASE 10/11/2009  . DIABETES MELLITUS, TYPE II 10/09/2009   . HYPERLIPIDEMIA 10/09/2009  . Essential hypertension 10/09/2009  . MYOCARDIAL INFARCTION, HX OF 10/09/2009   Past Medical History:  Diagnosis Date  . Allergy   . Asthma   . Coronary artery disease    s/p lateral MI 4 /11, s/p Promus DES OM2  . Diabetes mellitus without complication ([place])   . Heart murmur   . Hyperlipidemia   . Hypertension   . Myocardial infarct [facility]) 2011    Family History  Problem Relation Age of Onset  . Heart disease Mother   . Hypertension Mother   . Heart disease Father   . Hypertension Father   . Heart disease Brother   . Heart disease Sister   . Colon cancer Maternal Grandfather 76  . Colon polyps Neg Hx   . Esophageal cancer Neg Hx   . Stomach cancer Neg Hx   . Rectal cancer Neg Hx     Past Surgical History:  Procedure Laterality Date  . ADENOIDECTOMY    . CORONARY ARTERY BYPASS GRAFT  2011  . CORONARY STENT PLACEMENT    . TONSILLECTOMY    . TYMPANIC MEMBRANE REPAIR Right 2004  . VASECTOMY     Social History   Occupational History  . Occupation: Charity fundraiser  . Occupation: retired  Tobacco Use  . Smoking status: Former Smoker    Packs/day: 0.50    Years: 5.00    Pack years: 2.50    Types: Cigarettes    Quit date: 10/14/1974    Years since quitting: 44.3  . Smokeless tobacco: Never Used  Substance and Sexual Activity  . Alcohol use: Yes    Alcohol/week: 5.0 standard drinks    Types: 5 Standard drinks or equivalent per week  . Drug use: No  . Sexual activity: Yes

## 2019-02-20 ENCOUNTER — Other Ambulatory Visit: Payer: Self-pay | Admitting: Emergency Medicine

## 2019-02-21 NOTE — Telephone Encounter (Signed)
Requested medication (s) are due for refill today: yes  Requested medication (s) are on the active medication list: yes  Last refill:  08/12/18 #180 with 1 refill  Future visit scheduled: yes  Notes to clinic:  Pt requesting a year supply. Per protocol can only fill for 6 months. Current prescription expired.    Requested Prescriptions  Pending Prescriptions Disp Refills   carvedilol (COREG) 25 MG tablet [Pharmacy Med Name: CARVEDILOL  25MG   TAB] 180 tablet 3    Sig: TAKE 1 TABLET BY MOUTH 2  TIMES DAILY WITH A MEAL.     Cardiovascular:  Beta Blockers Passed - 02/20/2019 10:17 PM      Passed - Last BP in normal range    BP Readings from Last 1 Encounters:  02/09/19 133/75         Passed - Last Heart Rate in normal range    Pulse Readings from Last 1 Encounters:  02/09/19 60         Passed - Valid encounter within last 6 months    Recent Outpatient Visits          1 week ago Type 2 diabetes mellitus with hyperglycemia, without long-term current use of insulin [facility])   Primary Care at [facility], [place], [place]   6 months ago Type 2 diabetes mellitus with hyperglycemia, without long-term current use of insulin [facility])   Primary Care at [facility], Ines Bloomer, MD   1 year ago Essential hypertension   Primary Care at [place], Ines Bloomer, MD   2 years ago Essential hypertension   [facility] at [facility], Kayleen Memos, MD   2 years ago Asthma with acute exacerbation, unspecified asthma severity, unspecified whether persistent   Primary Care at [facility], Kayleen Memos, MD      Future Appointments            In 5 months Sagardia, Ines Bloomer, MD Primary Care at [place], [facility]

## 2019-03-16 ENCOUNTER — Other Ambulatory Visit: Payer: Self-pay | Admitting: Emergency Medicine

## 2019-03-16 DIAGNOSIS — I10 Essential (primary) hypertension: Secondary | ICD-10-CM

## 2019-05-14 DIAGNOSIS — E119 Type 2 diabetes mellitus without complications: Secondary | ICD-10-CM | POA: Diagnosis not present

## 2019-06-05 ENCOUNTER — Other Ambulatory Visit: Payer: Self-pay | Admitting: Emergency Medicine

## 2019-06-05 DIAGNOSIS — E1165 Type 2 diabetes mellitus with hyperglycemia: Secondary | ICD-10-CM

## 2019-06-06 ENCOUNTER — Other Ambulatory Visit: Payer: Self-pay | Admitting: Emergency Medicine

## 2019-06-06 DIAGNOSIS — E1165 Type 2 diabetes mellitus with hyperglycemia: Secondary | ICD-10-CM

## 2019-06-06 NOTE — Telephone Encounter (Signed)
Refill request approved for 90 days per protocol. Requested Prescriptions  Pending Prescriptions Disp Refills  . glipiZIDE (GLUCOTROL) 5 MG tablet [Pharmacy Med Name: GLIPIZIDE  5MG   TAB] 90 tablet 0    Sig: TAKE 1 TABLET BY MOUTH  DAILY BEFORE BREAKFAST     Endocrinology:  Diabetes - Sulfonylureas Passed - 06/05/2019 10:40 PM      Passed - HBA1C is between 0 and 7.9 and within 180 days    Hgb A1c MFr Bld  Date Value Ref Range Status  02/09/2019 7.1 (H) 4.8 - 5.6 % Final    Comment:             Prediabetes: 5.7 - 6.4          Diabetes: >6.4          Glycemic control for adults with diabetes: <7.0          Passed - Valid encounter within last 6 months    Recent Outpatient Visits          3 months ago Type 2 diabetes mellitus with hyperglycemia, without long-term current use of insulin [facility])   Primary Care at [facility], [place], [place]   9 months ago Type 2 diabetes mellitus with hyperglycemia, without long-term current use of insulin [facility])   Primary Care at [facility], Ines Bloomer, MD   1 year ago Essential hypertension   Primary Care at [place], Ines Bloomer, MD   2 years ago Essential hypertension   [facility] at [facility], Kayleen Memos, MD   2 years ago Asthma with acute exacerbation, unspecified asthma severity, unspecified whether persistent   [facility] at [facility], Kayleen Memos, MD      Future Appointments            In 2 months Sagardia, Ines Bloomer, MD Primary Care at [place], [facility]

## 2019-08-11 ENCOUNTER — Ambulatory Visit (INDEPENDENT_AMBULATORY_CARE_PROVIDER_SITE_OTHER): Payer: Medicare Other | Admitting: Family Medicine

## 2019-08-11 VITALS — BP 133/75 | Ht 69.0 in | Wt 202.0 lb

## 2019-08-11 DIAGNOSIS — Z0000 Encounter for general adult medical examination without abnormal findings: Secondary | ICD-10-CM

## 2019-08-11 NOTE — Patient Instructions (Addendum)
Thank you for taking time to come for your Medicare Wellness Visit. I appreciate your ongoing commitment to your health goals. Please review the following plan we discussed and let me know if I can assist you in the future.  Leroy Kennedy LPN  Preventive Care 64 Years and Older, Male Preventive care refers to lifestyle choices and visits with your health care provider that can promote health and wellness. This includes:  A yearly physical exam. This is also called an annual well check.  Regular dental and eye exams.  Immunizations.  Screening for certain conditions.  Healthy lifestyle choices, such as diet and exercise. What can I expect for my preventive care visit? Physical exam Your health care provider will check:  Height and weight. These may be used to calculate body mass index (BMI), which is a measurement that tells if you are at a healthy weight.  Heart rate and blood pressure.  Your skin for abnormal spots. Counseling Your health care provider may ask you questions about:  Alcohol, tobacco, and drug use.  Emotional well-being.  Home and relationship well-being.  Sexual activity.  Eating habits.  History of falls.  Memory and ability to understand (cognition).  Work and work Statistician. What immunizations do I need?  Influenza (flu) vaccine  This is recommended every year. Tetanus, diphtheria, and pertussis (Tdap) vaccine  You may need a Td booster every 10 years. Varicella (chickenpox) vaccine  You may need this vaccine if you have not already been vaccinated. Zoster (shingles) vaccine  You may need this after age 66. Pneumococcal conjugate (PCV13) vaccine  One dose is recommended after age 84. Pneumococcal polysaccharide (PPSV23) vaccine  One dose is recommended after age 88. Measles, mumps, and rubella (MMR) vaccine  You may need at least one dose of MMR if you were born in 1957 or later. You may also need a second dose. Meningococcal  conjugate (MenACWY) vaccine  You may need this if you have certain conditions. Hepatitis A vaccine  You may need this if you have certain conditions or if you travel or work in places where you may be exposed to hepatitis A. Hepatitis B vaccine  You may need this if you have certain conditions or if you travel or work in places where you may be exposed to hepatitis B. Haemophilus influenzae type b (Hib) vaccine  You may need this if you have certain conditions. You may receive vaccines as individual doses or as more than one vaccine together in one shot (combination vaccines). Talk with your health care provider about the risks and benefits of combination vaccines. What tests do I need? Blood tests  Lipid and cholesterol levels. These may be checked every 5 years, or more frequently depending on your overall health.  Hepatitis C test.  Hepatitis B test. Screening  Lung cancer screening. You may have this screening every year starting at age 24 if you have a 30-pack-year history of smoking and currently smoke or have quit within the past 15 years.  Colorectal cancer screening. All adults should have this screening starting at age 52 and continuing until age 61. Your health care provider may recommend screening at age 57 if you are at increased risk. You will have tests every 1-10 years, depending on your results and the type of screening test.  Prostate cancer screening. Recommendations will vary depending on your family history and other risks.  Diabetes screening. This is done by checking your blood sugar (glucose) after you have not eaten for  a while (fasting). You may have this done every 1-3 years.  Abdominal aortic aneurysm (AAA) screening. You may need this if you are a current or former smoker.  Sexually transmitted disease (STD) testing. Follow these instructions at home: Eating and drinking  Eat a diet that includes fresh fruits and vegetables, whole grains, lean  protein, and low-fat dairy products. Limit your intake of foods with high amounts of sugar, saturated fats, and salt.  Take vitamin and mineral supplements as recommended by your health care provider.  Do not drink alcohol if your health care provider tells you not to drink.  If you drink alcohol: ? Limit how much you have to 0-2 drinks a day. ? Be aware of how much alcohol is in your drink. In the [place] one drink equals one 12 oz bottle of beer (355 mL), one 5 oz glass of wine (148 mL), or one 1 oz glass of hard liquor (44 mL). Lifestyle  Take daily care of your teeth and gums.  Stay active. Exercise for at least 30 minutes on 5 or more days each week.  Do not use any products that contain nicotine or tobacco, such as cigarettes, e-cigarettes, and chewing tobacco. If you need help quitting, ask your health care provider.  If you are sexually active, practice safe sex. Use a condom or other form of protection to prevent STIs (sexually transmitted infections).  Talk with your health care provider about taking a low-dose aspirin or statin. What's next?  Visit your health care provider once a year for a well check visit.  Ask your health care provider how often you should have your eyes and teeth checked.  Stay up to date on all vaccines. This information is not intended to replace advice given to you by your health care provider. Make sure you discuss any questions you have with your health care provider. Document Revised: 06/11/2018 Document Reviewed: 06/11/2018 Elsevier Patient Education  2020 Elsevier Inc.  

## 2019-08-11 NOTE — Progress Notes (Signed)
Presents today for [employer] Visit   Date of last exam: 02/09/2019  Interpreter used for this visit? No  I connected with  Irven Shelling on 08/11/19 by telephone  and verified that I am speaking with the correct person using two identifiers.   I discussed the limitations of evaluation and management by telemedicine. The patient expressed understanding and agreed to proceed.   Patient Care Team: Horald Pollen, MD as PCP - General (Internal Medicine)   Other items to address today:  Discussed Eye/Dental Discussed Immunizations First COVID shot scheduled 08/13/2019 Follow up scheduled Dr. Mitchel Honour 2/16 @ 8:00am   Other Screening: Last screening for diabetes: 02/09/2019 Last lipid screening: 02/09/2019  ADVANCE DIRECTIVES: Discussed: YES On File: NO Materials Provided: NO  Immunization status:  Immunization History  Administered Date(s) Administered  . Pneumococcal Conjugate-13 08/12/2018  . Td 01/17/2010  . Tdap 08/10/2012     Health Maintenance Due  Topic Date Due  . FOOT EXAM  02/10/2019     Functional Status Survey: Is the patient deaf or have difficulty hearing?: Yes(ringing in right ear) Does the patient have difficulty seeing, even when wearing glasses/contacts?: No Does the patient have difficulty concentrating, remembering, or making decisions?: No Does the patient have difficulty walking or climbing stairs?: No Does the patient have difficulty dressing or bathing?: No Does the patient have difficulty doing errands alone such as visiting a doctor's office or shopping?: No   6CIT Screen 08/11/2019 10/14/2018  What Year? 0 points 0 points  What month? 0 points 0 points  What time? 0 points 0 points  Count back from 20 0 points 0 points  Months in reverse 0 points 0 points  Repeat phrase 0 points 0 points  Total Score 0 0        Clinical Support from 08/11/2019 in Primary Care at [place]  AUDIT-C Score  0       Home  Environment:   Lives in [place] 2nd floor No trouble climbing stairs No scatter rugs Yes grab bars Adequate lighting/no clutter  Timed Warm up N/A   Patient Active Problem List   Diagnosis Date Noted  . Impingement syndrome of right shoulder 02/16/2019  . History of diabetes mellitus 02/09/2018  . CARDIOMYOPATHY, ISCHEMIC 05/18/2010  . CORONARY ARTERY DISEASE 10/11/2009  . DIABETES MELLITUS, TYPE II 10/09/2009  . HYPERLIPIDEMIA 10/09/2009  . Essential hypertension 10/09/2009  . MYOCARDIAL INFARCTION, HX OF 10/09/2009     Past Medical History:  Diagnosis Date  . Allergy   . Asthma   . Coronary artery disease    s/p lateral MI 4 /11, s/p Promus DES OM2  . Diabetes mellitus without complication ([place])   . Heart murmur   . Hyperlipidemia   . Hypertension   . Myocardial infarct [facility]) 2011     Past Surgical History:  Procedure Laterality Date  . ADENOIDECTOMY    . CORONARY ARTERY BYPASS GRAFT  2011  . CORONARY STENT PLACEMENT    . TONSILLECTOMY    . TYMPANIC MEMBRANE REPAIR Right 2004  . VASECTOMY       Family History  Problem Relation Age of Onset  . Heart disease Mother   . Hypertension Mother   . Heart disease Father   . Hypertension Father   . Heart disease Brother   . Heart disease Sister   . Colon cancer Maternal Grandfather 5  . Colon polyps Neg Hx   . Esophageal cancer Neg Hx   .  Stomach cancer Neg Hx   . Rectal cancer Neg Hx      Social History   Socioeconomic History  . Marital status: Married    Spouse name: Not on file  . Number of children: 3  . Years of education: Not on file  . Highest education level: Not on file  Occupational History  . Occupation: Charity fundraiser  . Occupation: retired  Tobacco Use  . Smoking status: Former Smoker    Packs/day: 0.50    Years: 5.00    Pack years: 2.50    Types: Cigarettes    Quit date: 10/14/1974    Years since quitting: 44.8  . Smokeless tobacco: Never Used  Substance and Sexual  Activity  . Alcohol use: Yes    Alcohol/week: 5.0 standard drinks    Types: 5 Standard drinks or equivalent per week  . Drug use: No  . Sexual activity: Yes  Other Topics Concern  . Not on file  Social History Narrative   Occupation- Photographer    Former -tobacco -stopped 1979   Married ,3 children     Alcohol use - yes , social    Drug use - no   Regular exercise- yes    Social Determinants of Radio broadcast assistant Strain: Low Risk   . Difficulty of Paying Living Expenses: Not hard at all  Food Insecurity: No Food Insecurity  . Worried About Charity fundraiser in the Last Year: Never true  . Ran Out of Food in the Last Year: Never true  Transportation Needs: No Transportation Needs  . Lack of Transportation (Medical): No  . Lack of Transportation (Non-Medical): No  Physical Activity: Sufficiently Active  . Days of Exercise per Week: 3 days  . Minutes of Exercise per Session: 100 min  Stress: No Stress Concern Present  . Feeling of Stress : Not at all  Social Connections: Slightly Isolated  . Frequency of Communication with Friends and Family: Once a week  . Frequency of Social Gatherings with Friends and Family: Once a week  . Attends Religious Services: More than 4 times per year  . Active Member of Clubs or Organizations: Yes  . Attends Archivist Meetings: 1 to 4 times per year  . Marital Status: Married  Human resources officer Violence: Not At Risk  . Fear of Current or Ex-Partner: No  . Emotionally Abused: No  . Physically Abused: No  . Sexually Abused: No     No Known Allergies   Prior to Admission medications   Medication Sig Start Date End Date Taking? Authorizing Provider  ALBUTEROL IN Inhale into the lungs.   Yes [provider]  amLODipine (NORVASC) 5 MG tablet TAKE 1 TABLET BY MOUTH EVERY DAY 10/30/18  Yes Sagardia, Ines Bloomer, MD  Ascorbic Acid (VITAMIN C) 1000 MG tablet Take 2,000 mg by mouth daily.   Yes [provider]  aspirin 81 MG tablet Take 81 mg by mouth daily.   Yes [provider]  carvedilol (COREG) 25 MG tablet TAKE 1 TABLET BY MOUTH 2  TIMES DAILY WITH A MEAL. 02/22/19  Yes Sagardia, Ines Bloomer, MD  cetirizine (ZYRTEC ALLERGY) 10 MG tablet Take 1 tablet (10 mg total) by mouth daily. 10/13/17  Yes Jaynee Eagles, PA-C  Coenzyme Q10 (CO Q 10 PO) Take by mouth.   Yes [provider]  fluticasone (FLONASE) 50 MCG/ACT nasal spray Place 2 sprays into both nostrils daily. 10/13/17  Yes Jaynee Eagles, PA-C  glipiZIDE (GLUCOTROL) 5 MG tablet TAKE 1 TABLET BY MOUTH  DAILY BEFORE BREAKFAST 06/06/19  Yes Sagardia, Ines Bloomer, MD  lisinopril (ZESTRIL) 40 MG tablet TAKE 1 TABLET BY MOUTH  DAILY 03/17/19  Yes Sagardia, Ines Bloomer, MD  metFORMIN (GLUCOPHAGE) 500 MG tablet TAKE 1 TABLET BY MOUTH  TWICE DAILY WITH A MEAL 06/07/19  Yes Sagardia, Ines Bloomer, MD  rosuvastatin (CRESTOR) 20 MG tablet Take 1 tablet (20 mg total) by mouth daily. 08/13/18  Yes Horald Pollen, MD     Depression screen [facility] 2/9 08/11/2019 02/09/2019 10/14/2018 08/12/2018 02/09/2018  Decreased Interest 0 0 0 0 0  Down, Depressed, Hopeless 0 0 0 0 0  PHQ - 2 Score 0 0 0 0 0     Fall Risk  08/11/2019 02/09/2019 10/14/2018 08/12/2018 02/09/2018  Falls in the past year? 0 1 1 1  No  Number falls in past yr: 0 0 0 0 -  Comment - - - fell on slippery deck, "Jammed right shoulder -  Injury with Fall? 0 1 1 - -  Comment - injury to RIGHT shoulder - - -  Follow up - Falls evaluation completed - - -      PHYSICAL EXAM: BP 133/75 Comment: not in clinic  Ht 5\' 9"  (1.753 m)   Wt 202 lb (91.6 kg)   BMI 29.83 kg/m    Wt Readings from Last 3 Encounters:  08/11/19 202 lb (91.6 kg)  02/09/19 202 lb 6.4 oz (91.8 kg)  11/16/18 210 lb (95.3 kg)       Education/Counseling provided regarding diet and exercise, prevention of chronic diseases, smoking/tobacco cessation, if applicable, and reviewed "Covered Medicare  Preventive Services."

## 2019-08-13 ENCOUNTER — Ambulatory Visit: Payer: Medicare Other

## 2019-08-17 ENCOUNTER — Encounter: Payer: Self-pay | Admitting: Emergency Medicine

## 2019-08-17 ENCOUNTER — Other Ambulatory Visit: Payer: Self-pay

## 2019-08-17 ENCOUNTER — Ambulatory Visit: Payer: Medicare Other | Admitting: Emergency Medicine

## 2019-08-17 VITALS — BP 153/80 | HR 63 | Temp 98.1°F | Resp 16 | Ht 69.0 in | Wt 208.0 lb

## 2019-08-17 DIAGNOSIS — E1169 Type 2 diabetes mellitus with other specified complication: Secondary | ICD-10-CM | POA: Diagnosis not present

## 2019-08-17 DIAGNOSIS — E1165 Type 2 diabetes mellitus with hyperglycemia: Secondary | ICD-10-CM

## 2019-08-17 DIAGNOSIS — E785 Hyperlipidemia, unspecified: Secondary | ICD-10-CM | POA: Diagnosis not present

## 2019-08-17 DIAGNOSIS — I2510 Atherosclerotic heart disease of native coronary artery without angina pectoris: Secondary | ICD-10-CM

## 2019-08-17 DIAGNOSIS — I152 Hypertension secondary to endocrine disorders: Secondary | ICD-10-CM

## 2019-08-17 DIAGNOSIS — E1159 Type 2 diabetes mellitus with other circulatory complications: Secondary | ICD-10-CM | POA: Diagnosis not present

## 2019-08-17 DIAGNOSIS — I255 Ischemic cardiomyopathy: Secondary | ICD-10-CM | POA: Diagnosis not present

## 2019-08-17 DIAGNOSIS — I10 Essential (primary) hypertension: Secondary | ICD-10-CM

## 2019-08-17 LAB — LIPID PANEL
Chol/HDL Ratio: 2.4 ratio (ref 0.0–5.0)
Cholesterol, Total: 129 mg/dL (ref 100–199)
HDL: 54 mg/dL (ref >39)
LDL Chol Calc (NIH): 49 mg/dL (ref 0–99)
Triglycerides: 154 mg/dL — ABNORMAL HIGH (ref 0–149)
VLDL Cholesterol Cal: 26 mg/dL (ref 5–40)

## 2019-08-17 LAB — POCT GLYCOSYLATED HEMOGLOBIN (HGB A1C): Hemoglobin A1C: 6.5 % — AB (ref 4.0–5.6)

## 2019-08-17 LAB — CBC WITH DIFFERENTIAL/PLATELET
Basophils Absolute: 0.1 10*3/uL (ref 0.0–0.2)
Basos: 1 %
EOS (ABSOLUTE): 0.3 10*3/uL (ref 0.0–0.4)
Eos: 4 %
Hematocrit: 45.4 % (ref 37.5–51.0)
Hemoglobin: 15.4 g/dL (ref 13.0–17.7)
Immature Grans (Abs): 0 10*3/uL (ref 0.0–0.1)
Immature Granulocytes: 0 %
Lymphocytes Absolute: 1.4 10*3/uL (ref 0.7–3.1)
Lymphs: 25 %
MCH: 29.6 pg (ref 26.6–33.0)
MCHC: 33.9 g/dL (ref 31.5–35.7)
MCV: 87 fL (ref 79–97)
Monocytes Absolute: 0.6 10*3/uL (ref 0.1–0.9)
Monocytes: 10 %
Neutrophils Absolute: 3.4 10*3/uL (ref 1.4–7.0)
Neutrophils: 60 %
Platelets: 175 10*3/uL (ref 150–450)
RBC: 5.21 x10E6/uL (ref 4.14–5.80)
RDW: 12.9 % (ref 11.6–15.4)
WBC: 5.7 10*3/uL (ref 3.4–10.8)

## 2019-08-17 LAB — COMPREHENSIVE METABOLIC PANEL
ALT: 20 IU/L (ref 0–44)
AST: 17 IU/L (ref 0–40)
Albumin/Globulin Ratio: 2.4 — ABNORMAL HIGH (ref 1.2–2.2)
Albumin: 4.6 g/dL (ref 3.8–4.8)
Alkaline Phosphatase: 61 IU/L (ref 39–117)
BUN/Creatinine Ratio: 11 (ref 10–24)
BUN: 11 mg/dL (ref 8–27)
Bilirubin Total: 0.4 mg/dL (ref 0.0–1.2)
CO2: 20 mmol/L (ref 20–29)
Calcium: 9.1 mg/dL (ref 8.6–10.2)
Chloride: 106 mmol/L (ref 96–106)
Creatinine, Ser: 1 mg/dL (ref 0.76–1.27)
GFR calc Af Amer: 90 mL/min/{1.73_m2} (ref >59)
GFR calc non Af Amer: 78 mL/min/{1.73_m2} (ref >59)
Globulin, Total: 1.9 g/dL (ref 1.5–4.5)
Glucose: 125 mg/dL — ABNORMAL HIGH (ref 65–99)
Potassium: 4.2 mmol/L (ref 3.5–5.2)
Sodium: 142 mmol/L (ref 134–144)
Total Protein: 6.5 g/dL (ref 6.0–8.5)

## 2019-08-17 LAB — GLUCOSE, POCT (MANUAL RESULT ENTRY): POC Glucose: 121 mg/dl — AB (ref 70–99)

## 2019-08-17 MED ORDER — GLIPIZIDE 5 MG PO TABS: 5.0000 mg | ORAL_TABLET | Freq: Every day | ORAL | 3 refills | Status: DC

## 2019-08-17 MED ORDER — ROSUVASTATIN CALCIUM 20 MG PO TABS: 20.0000 mg | ORAL_TABLET | Freq: Every day | ORAL | 3 refills | Status: DC

## 2019-08-17 NOTE — Progress Notes (Signed)
Note reviewed, and agree with documentation and plan. Signed,   Merri Ray, MD Primary Care at [place].  08/17/19 9:57 AM

## 2019-08-17 NOTE — Patient Instructions (Addendum)
   If you have lab work done today you will be contacted with your lab results within the next 2 weeks.  If you have not heard from us then please contact us. The fastest way to get your results is to register for My Chart.   IF you received an x-ray today, you will receive an invoice from [facility]. Please contact [facility] at [phone] with questions or concerns regarding your invoice.   IF you received labwork today, you will receive an invoice from [facility]. Please contact [facility] at [phone] with questions or concerns regarding your invoice.   Our billing staff will not be able to assist you with questions regarding bills from these companies.  You will be contacted with the lab results as soon as they are available. The fastest way to get your results is to activate your My Chart account. Instructions are located on the last page of this paperwork. If you have not heard from us regarding the results in 2 weeks, please contact this office.     Hypertension, Adult High blood pressure (hypertension) is when the force of blood pumping through the arteries is too strong. The arteries are the blood vessels that carry blood from the heart throughout the body. Hypertension forces the heart to work harder to pump blood and may cause arteries to become narrow or stiff. Untreated or uncontrolled hypertension can cause a heart attack, heart failure, a stroke, kidney disease, and other problems. A blood pressure reading consists of a higher number over a lower number. Ideally, your blood pressure should be below 120/80. The first ("top") number is called the systolic pressure. It is a measure of the pressure in your arteries as your heart beats. The second ("bottom") number is called the diastolic pressure. It is a measure of the pressure in your arteries as the heart relaxes. What are the causes? The exact cause of this condition is not known. There are some conditions  that result in or are related to high blood pressure. What increases the risk? Some risk factors for high blood pressure are under your control. The following factors may make you more likely to develop this condition:  Smoking.  Having type 2 diabetes mellitus, high cholesterol, or both.  Not getting enough exercise or physical activity.  Being overweight.  Having too much fat, sugar, calories, or salt (sodium) in your diet.  Drinking too much alcohol. Some risk factors for high blood pressure may be difficult or impossible to change. Some of these factors include:  Having chronic kidney disease.  Having a family history of high blood pressure.  Age. Risk increases with age.  Race. You may be at higher risk if you are African American.  Gender. Men are at higher risk than women before age 45. After age 65, women are at higher risk than men.  Having obstructive sleep apnea.  Stress. What are the signs or symptoms? High blood pressure may not cause symptoms. Very high blood pressure (hypertensive crisis) may cause:  Headache.  Anxiety.  Shortness of breath.  Nosebleed.  Nausea and vomiting.  Vision changes.  Severe chest pain.  Seizures. How is this diagnosed? This condition is diagnosed by measuring your blood pressure while you are seated, with your arm resting on a flat surface, your legs uncrossed, and your feet flat on the floor. The cuff of the blood pressure monitor will be placed directly against the skin of your upper arm at the level of your heart.   It should be measured at least twice using the same arm. Certain conditions can cause a difference in blood pressure between your right and left arms. Certain factors can cause blood pressure readings to be lower or higher than normal for a short period of time:  When your blood pressure is higher when you are in a health care provider's office than when you are at home, this is called white coat hypertension.  Most people with this condition do not need medicines.  When your blood pressure is higher at home than when you are in a health care provider's office, this is called masked hypertension. Most people with this condition may need medicines to control blood pressure. If you have a high blood pressure reading during one visit or you have normal blood pressure with other risk factors, you may be asked to:  Return on a different day to have your blood pressure checked again.  Monitor your blood pressure at home for 1 week or longer. If you are diagnosed with hypertension, you may have other blood or imaging tests to help your health care provider understand your overall risk for other conditions. How is this treated? This condition is treated by making healthy lifestyle changes, such as eating healthy foods, exercising more, and reducing your alcohol intake. Your health care provider may prescribe medicine if lifestyle changes are not enough to get your blood pressure under control, and if:  Your systolic blood pressure is above 130.  Your diastolic blood pressure is above 80. Your personal target blood pressure may vary depending on your medical conditions, your age, and other factors. Follow these instructions at home: Eating and drinking   Eat a diet that is high in fiber and potassium, and low in sodium, added sugar, and fat. An example eating plan is called the DASH (Dietary Approaches to Stop Hypertension) diet. To eat this way: ? Eat plenty of fresh fruits and vegetables. Try to fill one half of your plate at each meal with fruits and vegetables. ? Eat whole grains, such as whole-wheat pasta, brown rice, or whole-grain bread. Fill about one fourth of your plate with whole grains. ? Eat or drink low-fat dairy products, such as skim milk or low-fat yogurt. ? Avoid fatty cuts of meat, processed or cured meats, and poultry with skin. Fill about one fourth of your plate with lean proteins, such  as fish, chicken without skin, beans, eggs, or tofu. ? Avoid pre-made and processed foods. These tend to be higher in sodium, added sugar, and fat.  Reduce your daily sodium intake. Most people with hypertension should eat less than 1,500 mg of sodium a day.  Do not drink alcohol if: ? Your health care provider tells you not to drink. ? You are pregnant, may be pregnant, or are planning to become pregnant.  If you drink alcohol: ? Limit how much you use to:  0-1 drink a day for women.  0-2 drinks a day for men. ? Be aware of how much alcohol is in your drink. In the [place] one drink equals one 12 oz bottle of beer (355 mL), one 5 oz glass of wine (148 mL), or one 1 oz glass of hard liquor (44 mL). Lifestyle   Work with your health care provider to maintain a healthy body weight or to lose weight. Ask what an ideal weight is for you.  Get at least 30 minutes of exercise most days of the week. Activities may include walking, swimming, or   biking.  Include exercise to strengthen your muscles (resistance exercise), such as Pilates or lifting weights, as part of your weekly exercise routine. Try to do these types of exercises for 30 minutes at least 3 days a week.  Do not use any products that contain nicotine or tobacco, such as cigarettes, e-cigarettes, and chewing tobacco. If you need help quitting, ask your health care provider.  Monitor your blood pressure at home as told by your health care provider.  Keep all follow-up visits as told by your health care provider. This is important. Medicines  Take over-the-counter and prescription medicines only as told by your health care provider. Follow directions carefully. Blood pressure medicines must be taken as prescribed.  Do not skip doses of blood pressure medicine. Doing this puts you at risk for problems and can make the medicine less effective.  Ask your health care provider about side effects or reactions to medicines that you  should watch for. Contact a health care provider if you:  Think you are having a reaction to a medicine you are taking.  Have headaches that keep coming back (recurring).  Feel dizzy.  Have swelling in your ankles.  Have trouble with your vision. Get help right away if you:  Develop a severe headache or confusion.  Have unusual weakness or numbness.  Feel faint.  Have severe pain in your chest or abdomen.  Vomit repeatedly.  Have trouble breathing. Summary  Hypertension is when the force of blood pumping through your arteries is too strong. If this condition is not controlled, it may put you at risk for serious complications.  Your personal target blood pressure may vary depending on your medical conditions, your age, and other factors. For most people, a normal blood pressure is less than 120/80.  Hypertension is treated with lifestyle changes, medicines, or a combination of both. Lifestyle changes include losing weight, eating a healthy, low-sodium diet, exercising more, and limiting alcohol. This information is not intended to replace advice given to you by your health care provider. Make sure you discuss any questions you have with your health care provider. Document Revised: 02/25/2018 Document Reviewed: 02/25/2018 Elsevier Patient Education  [place].  Diabetes Mellitus and Nutrition, Adult When you have diabetes (diabetes mellitus), it is very important to have healthy eating habits because your blood sugar (glucose) levels are greatly affected by what you eat and drink. Eating healthy foods in the appropriate amounts, at about the same times every day, can help you:  Control your blood glucose.  Lower your risk of heart disease.  Improve your blood pressure.  Reach or maintain a healthy weight. Every person with diabetes is different, and each person has different needs for a meal plan. Your health care provider may recommend that you work with a diet  and nutrition specialist (dietitian) to make a meal plan that is best for you. Your meal plan may vary depending on factors such as:  The calories you need.  The medicines you take.  Your weight.  Your blood glucose, blood pressure, and cholesterol levels.  Your activity level.  Other health conditions you have, such as heart or kidney disease. How do carbohydrates affect me? Carbohydrates, also called carbs, affect your blood glucose level more than any other type of food. Eating carbs naturally raises the amount of glucose in your blood. Carb counting is a method for keeping track of how many carbs you eat. Counting carbs is important to keep your blood glucose at  a healthy level, especially if you use insulin or take certain oral diabetes medicines. It is important to know how many carbs you can safely have in each meal. This is different for every person. Your dietitian can help you calculate how many carbs you should have at each meal and for each snack. Foods that contain carbs include:  Bread, cereal, rice, pasta, and crackers.  Potatoes and corn.  Peas, beans, and lentils.  Milk and yogurt.  Fruit and juice.  Desserts, such as cakes, cookies, ice cream, and candy. How does alcohol affect me? Alcohol can cause a sudden decrease in blood glucose (hypoglycemia), especially if you use insulin or take certain oral diabetes medicines. Hypoglycemia can be a life-threatening condition. Symptoms of hypoglycemia (sleepiness, dizziness, and confusion) are similar to symptoms of having too much alcohol. If your health care provider says that alcohol is safe for you, follow these guidelines:  Limit alcohol intake to no more than 1 drink per day for nonpregnant women and 2 drinks per day for men. One drink equals 12 oz of beer, 5 oz of wine, or 1 oz of hard liquor.  Do not drink on an empty stomach.  Keep yourself hydrated with water, diet soda, or unsweetened iced tea.  Keep in  mind that regular soda, juice, and other mixers may contain a lot of sugar and must be counted as carbs. What are tips for following this plan?  Reading food labels  Start by checking the serving size on the "Nutrition Facts" label of packaged foods and drinks. The amount of calories, carbs, fats, and other nutrients listed on the label is based on one serving of the item. Many items contain more than one serving per package.  Check the total grams (g) of carbs in one serving. You can calculate the number of servings of carbs in one serving by dividing the total carbs by 15. For example, if a food has 30 g of total carbs, it would be equal to 2 servings of carbs.  Check the number of grams (g) of saturated and trans fats in one serving. Choose foods that have low or no amount of these fats.  Check the number of milligrams (mg) of salt (sodium) in one serving. Most people should limit total sodium intake to less than 2,300 mg per day.  Always check the nutrition information of foods labeled as "low-fat" or "nonfat". These foods may be higher in added sugar or refined carbs and should be avoided.  Talk to your dietitian to identify your daily goals for nutrients listed on the label. Shopping  Avoid buying canned, premade, or processed foods. These foods tend to be high in fat, sodium, and added sugar.  Shop around the outside edge of the grocery store. This includes fresh fruits and vegetables, bulk grains, fresh meats, and fresh dairy. Cooking  Use low-heat cooking methods, such as baking, instead of high-heat cooking methods like deep frying.  Cook using healthy oils, such as olive, canola, or sunflower oil.  Avoid cooking with butter, cream, or high-fat meats. Meal planning  Eat meals and snacks regularly, preferably at the same times every day. Avoid going long periods of time without eating.  Eat foods high in fiber, such as fresh fruits, vegetables, beans, and whole grains. Talk  to your dietitian about how many servings of carbs you can eat at each meal.  Eat 4-6 ounces (oz) of lean protein each day, such as lean meat, chicken, fish, eggs, or tofu.  One oz of lean protein is equal to: ? 1 oz of meat, chicken, or fish. ? 1 egg. ?  cup of tofu.  Eat some foods each day that contain healthy fats, such as avocado, nuts, seeds, and fish. Lifestyle  Check your blood glucose regularly.  Exercise regularly as told by your health care provider. This may include: ? 150 minutes of moderate-intensity or vigorous-intensity exercise each week. This could be brisk walking, biking, or water aerobics. ? Stretching and doing strength exercises, such as yoga or weightlifting, at least 2 times a week.  Take medicines as told by your health care provider.  Do not use any products that contain nicotine or tobacco, such as cigarettes and e-cigarettes. If you need help quitting, ask your health care provider.  Work with a Social worker or diabetes educator to identify strategies to manage stress and any emotional and social challenges. Questions to ask a health care provider  Do I need to meet with a diabetes educator?  Do I need to meet with a dietitian?  What number can I call if I have questions?  When are the best times to check my blood glucose? Where to find more information:  American Diabetes Association: diabetes.org  Academy of Nutrition and Dietetics: www.eatright.[employer] of Diabetes and Digestive and Kidney Diseases (NIH): [employer] Summary  A healthy meal plan will help you control your blood glucose and maintain a healthy lifestyle.  Working with a diet and nutrition specialist (dietitian) can help you make a meal plan that is best for you.  Keep in mind that carbohydrates (carbs) and alcohol have immediate effects on your blood glucose levels. It is important to count carbs and to use alcohol carefully. This information is not intended  to replace advice given to you by your health care provider. Make sure you discuss any questions you have with your health care provider. Document Revised: 05/30/2017 Document Reviewed: 07/22/2016 Elsevier Patient Education  2020 [employer].

## 2019-08-17 NOTE — Assessment & Plan Note (Signed)
Systolic blood pressure slightly elevated.  Continue present medications.  No changes. Well-controlled diabetes with hemoglobin A1c at 6.5 improved from 7.1.  Continue present medications.  No changes.  Diet and nutrition discussed with patient. Continue statin therapy with Crestor 20 mg daily. Follow-up in 6 months.

## 2019-08-17 NOTE — Progress Notes (Addendum)
Scott Dodson 69 y.o.   Chief Complaint  Patient presents with  . Diabetes    6 month follow up  . Hypertension    HISTORY OF PRESENT ILLNESS: This is a 69 y.o. male with several chronic medical problems here for follow-up and medication refill. 1.  Hypertension: On amlodipine 5 mg daily, lisinopril 40 mg daily, and carvedilol 25 mg twice a day. BP Readings from Last 3 Encounters:  08/17/19 (!) 153/80  08/11/19 133/75  02/09/19 133/75   2.  Diabetes: On Metformin 500 mg twice a day and glipizide 5 mg in the morning.  Doing well no complaints Lab Results  Component Value Date   HGBA1C 7.1 (H) 02/09/2019   3.  Dyslipidemia: On Crestor 20 mg daily. Lab Results  Component Value Date   CHOL 136 02/09/2019   HDL 51 02/09/2019   LDLCALC 52 02/09/2019   LDLDIRECT 74.6 08/11/2012   TRIG 166 (H) 02/09/2019   CHOLHDL 2.7 02/09/2019   4.  Coronary atherosclerosis status post MI 2011 with 1 stent placed.  Takes 1 baby aspirin daily.  No chest pain on exertion.  Walks regularly.  On carvedilol 25 mg twice a day.  Ischemic cardiomyopathy. Overall doing well.  Has no complaints or medical concerns today.  Compliant with medications.  HPI   Prior to Admission medications   Medication Sig Start Date End Date Taking? Authorizing Provider  ALBUTEROL IN Inhale into the lungs.   Yes [provider]  amLODipine (NORVASC) 5 MG tablet TAKE 1 TABLET BY MOUTH EVERY DAY 10/30/18  Yes Raudel Bazen, Ines Bloomer, MD  Ascorbic Acid (VITAMIN C) 1000 MG tablet Take 2,000 mg by mouth daily.   Yes [provider]  aspirin 81 MG tablet Take 81 mg by mouth daily.   Yes [provider]  carvedilol (COREG) 25 MG tablet TAKE 1 TABLET BY MOUTH 2  TIMES DAILY WITH A MEAL. 02/22/19  Yes Quinn Quam, Ines Bloomer, MD  cetirizine (ZYRTEC ALLERGY) 10 MG tablet Take 1 tablet (10 mg total) by mouth daily. 10/13/17  Yes Jaynee Eagles, PA-C  Coenzyme Q10 (CO Q 10 PO) Take by mouth.   Yes [provider]  fluticasone (FLONASE) 50 MCG/ACT nasal spray Place 2 sprays into both nostrils daily. 10/13/17  Yes Jaynee Eagles, PA-C  glipiZIDE (GLUCOTROL) 5 MG tablet TAKE 1 TABLET BY MOUTH  DAILY BEFORE BREAKFAST 06/06/19  Yes Alaney Witter, Ines Bloomer, MD  lisinopril (ZESTRIL) 40 MG tablet TAKE 1 TABLET BY MOUTH  DAILY 03/17/19  Yes Tyjae Shvartsman, Ines Bloomer, MD  metFORMIN (GLUCOPHAGE) 500 MG tablet TAKE 1 TABLET BY MOUTH  TWICE DAILY WITH A MEAL 06/07/19  Yes Lyrah Bradt, Ines Bloomer, MD  rosuvastatin (CRESTOR) 20 MG tablet Take 1 tablet (20 mg total) by mouth daily. 08/13/18  Yes SagardiaInes Bloomer, MD    No Known Allergies  Patient Active Problem List   Diagnosis Date Noted  . Impingement syndrome of right shoulder 02/16/2019  . History of diabetes mellitus 02/09/2018  . CARDIOMYOPATHY, ISCHEMIC 05/18/2010  . CORONARY ARTERY DISEASE 10/11/2009  . DIABETES MELLITUS, TYPE II 10/09/2009  . HYPERLIPIDEMIA 10/09/2009  . Essential hypertension 10/09/2009  . MYOCARDIAL INFARCTION, HX OF 10/09/2009    Past Medical History:  Diagnosis Date  . Allergy   . Asthma   . Coronary artery disease    s/p lateral MI 4 /11, s/p Promus DES OM2  . Diabetes mellitus without complication ([place])   . Heart murmur   . Hyperlipidemia   .  Hypertension   . Myocardial infarct [facility]) 2011    Past Surgical History:  Procedure Laterality Date  . ADENOIDECTOMY    . CORONARY ARTERY BYPASS GRAFT  2011  . CORONARY STENT PLACEMENT    . TONSILLECTOMY    . TYMPANIC MEMBRANE REPAIR Right 2004  . VASECTOMY      Social History   Socioeconomic History  . Marital status: Married    Spouse name: Not on file  . Number of children: 3  . Years of education: Not on file  . Highest education level: Not on file  Occupational History  . Occupation: Charity fundraiser  . Occupation: retired  Tobacco Use  . Smoking status: Former Smoker    Packs/day: 0.50    Years: 5.00    Pack years: 2.50    Types: Cigarettes      Quit date: 10/14/1974    Years since quitting: 44.8  . Smokeless tobacco: Never Used  Substance and Sexual Activity  . Alcohol use: Yes    Alcohol/week: 5.0 standard drinks    Types: 5 Standard drinks or equivalent per week  . Drug use: No  . Sexual activity: Yes  Other Topics Concern  . Not on file  Social History Narrative   Occupation- Photographer    Former -tobacco -stopped 1979   Married ,3 children     Alcohol use - yes , social    Drug use - no   Regular exercise- yes    Social Determinants of Radio broadcast assistant Strain: Low Risk   . Difficulty of Paying Living Expenses: Not hard at all  Food Insecurity: No Food Insecurity  . Worried About Charity fundraiser in the Last Year: Never true  . Ran Out of Food in the Last Year: Never true  Transportation Needs: No Transportation Needs  . Lack of Transportation (Medical): No  . Lack of Transportation (Non-Medical): No  Physical Activity: Sufficiently Active  . Days of Exercise per Week: 3 days  . Minutes of Exercise per Session: 100 min  Stress: No Stress Concern Present  . Feeling of Stress : Not at all  Social Connections: Slightly Isolated  . Frequency of Communication with Friends and Family: Once a week  . Frequency of Social Gatherings with Friends and Family: Once a week  . Attends Religious Services: More than 4 times per year  . Active Member of Clubs or Organizations: Yes  . Attends Archivist Meetings: 1 to 4 times per year  . Marital Status: Married  Human resources officer Violence: Not At Risk  . Fear of Current or Ex-Partner: No  . Emotionally Abused: No  . Physically Abused: No  . Sexually Abused: No    Family History  Problem Relation Age of Onset  . Heart disease Mother   . Hypertension Mother   . Heart disease Father   . Hypertension Father   . Heart disease Brother   . Heart disease Sister   . Colon cancer Maternal Grandfather 27  . Colon polyps Neg Hx   .  Esophageal cancer Neg Hx   . Stomach cancer Neg Hx   . Rectal cancer Neg Hx      Review of Systems  Constitutional: Negative.  Negative for chills and fever.  HENT: Positive for tinnitus (Right ear). Negative for congestion and sore throat.   Respiratory: Positive for cough (Dry for 2 months, non-smoker). Negative for hemoptysis, sputum production, shortness of breath and wheezing.  Cardiovascular: Negative.  Negative for chest pain and palpitations.  Gastrointestinal: Negative.  Negative for abdominal pain, blood in stool, diarrhea, melena, nausea and vomiting.  Genitourinary: Negative.  Negative for dysuria and hematuria.  Musculoskeletal: Positive for joint pain (Right ankle status post sprain). Negative for back pain, myalgias and neck pain.  Skin: Negative.  Negative for rash.  Neurological: Negative.  Negative for dizziness, weakness and headaches.  All other systems reviewed and are negative.  Today's Vitals   08/17/19 0812  BP: (!) 153/80  Pulse: 63  Resp: 16  Temp: 98.1 F (36.7 C)  TempSrc: Temporal  SpO2: 96%  Weight: 208 lb (94.3 kg)  Height: 5\' 9"  (1.753 m)   Body mass index is 30.72 kg/m.   Physical Exam Vitals reviewed.  Constitutional:      Appearance: Normal appearance.  HENT:     Head: Normocephalic.     Right Ear: Tympanic membrane, ear canal and external ear normal.     Left Ear: Tympanic membrane, ear canal and external ear normal.     Mouth/Throat:     Mouth: Mucous membranes are moist.     Pharynx: Oropharynx is clear.  Eyes:     Extraocular Movements: Extraocular movements intact.     Conjunctiva/sclera: Conjunctivae normal.     Pupils: Pupils are equal, round, and reactive to light.  Neck:     Vascular: No carotid bruit.  Cardiovascular:     Rate and Rhythm: Normal rate and regular rhythm.     Pulses: Normal pulses.     Heart sounds: Normal heart sounds.  Pulmonary:     Effort: Pulmonary effort is normal.     Breath sounds: Normal  breath sounds.  Musculoskeletal:        General: Normal range of motion.     Cervical back: Normal range of motion and neck supple. No tenderness.     Comments: Right ankle: No swelling or tenderness.  Full range of motion.  Normal.  Lymphadenopathy:     Cervical: No cervical adenopathy.  Skin:    General: Skin is warm and dry.  Neurological:     General: No focal deficit present.     Mental Status: He is alert and oriented to person, place, and time.  Psychiatric:        Mood and Affect: Mood normal.        Behavior: Behavior normal.    Results for orders placed or performed in visit on 08/17/19 (from the past 24 hour(s))  POCT glucose (manual entry)     Status: Abnormal   Collection Time: 08/17/19  8:47 AM  Result Value Ref Range   POC Glucose 121 (A) 70 - 99 mg/dl  POCT glycosylated hemoglobin (Hb A1C)     Status: Abnormal   Collection Time: 08/17/19  8:52 AM  Result Value Ref Range   Hemoglobin A1C 6.5 (A) 4.0 - 5.6 %   HbA1c POC (<> result, manual entry)     HbA1c, POC (prediabetic range)     HbA1c, POC (controlled diabetic range)       ASSESSMENT & PLAN: Hypertension associated with diabetes (HCC) Systolic blood pressure slightly elevated.  Continue present medications.  No changes. Well-controlled diabetes with hemoglobin A1c at 6.5 improved from 7.1.  Continue present medications.  No changes.  Diet and nutrition discussed with patient. Continue statin therapy with Crestor 20 mg daily. Follow-up in 6 months.  Edu was seen today for diabetes and hypertension.  Diagnoses and all orders  for this visit:  Hypertension associated with diabetes ([place]) -     CBC with Differential/Platelet -     Comprehensive metabolic panel  Type 2 diabetes mellitus with hyperglycemia, without long-term current use of insulin (HCC) -     POCT glucose (manual entry) -     POCT glycosylated hemoglobin (Hb A1C) -     HM Diabetes Foot Exam -     glipiZIDE (GLUCOTROL) 5 MG tablet; Take  1 tablet (5 mg total) by mouth daily with breakfast.  Dyslipidemia associated with type 2 diabetes mellitus (HCC) -     Lipid panel -     rosuvastatin (CRESTOR) 20 MG tablet; Take 1 tablet (20 mg total) by mouth daily.  Cardiomyopathy, ischemic  Atherosclerosis of coronary artery of native heart without angina pectoris, unspecified vessel or lesion type    Patient Instructions       If you have lab work done today you will be contacted with your lab results within the next 2 weeks.  If you have not heard from [place] then please contact us. The fastest way to get your results is to register for My Chart.   IF you received an x-ray today, you will receive an invoice from [facility]. Please contact [facility] at [phone] with questions or concerns regarding your invoice.   IF you received labwork today, you will receive an invoice from [facility]. Please contact [facility] at [phone] with questions or concerns regarding your invoice.   Our billing staff will not be able to assist you with questions regarding bills from these companies.  You will be contacted with the lab results as soon as they are available. The fastest way to get your results is to activate your My Chart account. Instructions are located on the last page of this paperwork. If you have not heard from [place] regarding the results in 2 weeks, please contact this office.     Hypertension, Adult High blood pressure (hypertension) is when the force of blood pumping through the arteries is too strong. The arteries are the blood vessels that carry blood from the heart throughout the body. Hypertension forces the heart to work harder to pump blood and may cause arteries to become narrow or stiff. Untreated or uncontrolled hypertension can cause a heart attack, heart failure, a stroke, kidney disease, and other problems. A blood pressure reading consists of a higher number over a lower number. Ideally, your  blood pressure should be below 120/80. The first ("top") number is called the systolic pressure. It is a measure of the pressure in your arteries as your heart beats. The second ("bottom") number is called the diastolic pressure. It is a measure of the pressure in your arteries as the heart relaxes. What are the causes? The exact cause of this condition is not known. There are some conditions that result in or are related to high blood pressure. What increases the risk? Some risk factors for high blood pressure are under your control. The following factors may make you more likely to develop this condition:  Smoking.  Having type 2 diabetes mellitus, high cholesterol, or both.  Not getting enough exercise or physical activity.  Being overweight.  Having too much fat, sugar, calories, or salt (sodium) in your diet.  Drinking too much alcohol. Some risk factors for high blood pressure may be difficult or impossible to change. Some of these factors include:  Having chronic kidney disease.  Having a family history of high blood pressure.  Age. Risk increases with age.  Race. You may be at higher risk if you are African American.  Gender. Men are at higher risk than women before age 77. After age 64, women are at higher risk than men.  Having obstructive sleep apnea.  Stress. What are the signs or symptoms? High blood pressure may not cause symptoms. Very high blood pressure (hypertensive crisis) may cause:  Headache.  Anxiety.  Shortness of breath.  Nosebleed.  Nausea and vomiting.  Vision changes.  Severe chest pain.  Seizures. How is this diagnosed? This condition is diagnosed by measuring your blood pressure while you are seated, with your arm resting on a flat surface, your legs uncrossed, and your feet flat on the floor. The cuff of the blood pressure monitor will be placed directly against the skin of your upper arm at the level of your heart. It should be  measured at least twice using the same arm. Certain conditions can cause a difference in blood pressure between your right and left arms. Certain factors can cause blood pressure readings to be lower or higher than normal for a short period of time:  When your blood pressure is higher when you are in a health care provider's office than when you are at home, this is called white coat hypertension. Most people with this condition do not need medicines.  When your blood pressure is higher at home than when you are in a health care provider's office, this is called masked hypertension. Most people with this condition may need medicines to control blood pressure. If you have a high blood pressure reading during one visit or you have normal blood pressure with other risk factors, you may be asked to:  Return on a different day to have your blood pressure checked again.  Monitor your blood pressure at home for 1 week or longer. If you are diagnosed with hypertension, you may have other blood or imaging tests to help your health care provider understand your overall risk for other conditions. How is this treated? This condition is treated by making healthy lifestyle changes, such as eating healthy foods, exercising more, and reducing your alcohol intake. Your health care provider may prescribe medicine if lifestyle changes are not enough to get your blood pressure under control, and if:  Your systolic blood pressure is above 130.  Your diastolic blood pressure is above 80. Your personal target blood pressure may vary depending on your medical conditions, your age, and other factors. Follow these instructions at home: Eating and drinking   Eat a diet that is high in fiber and potassium, and low in sodium, added sugar, and fat. An example eating plan is called the DASH (Dietary Approaches to Stop Hypertension) diet. To eat this way: ? Eat plenty of fresh fruits and vegetables. Try to fill one half of  your plate at each meal with fruits and vegetables. ? Eat whole grains, such as whole-wheat pasta, brown rice, or whole-grain bread. Fill about one fourth of your plate with whole grains. ? Eat or drink low-fat dairy products, such as skim milk or low-fat yogurt. ? Avoid fatty cuts of meat, processed or cured meats, and poultry with skin. Fill about one fourth of your plate with lean proteins, such as fish, chicken without skin, beans, eggs, or tofu. ? Avoid pre-made and processed foods. These tend to be higher in sodium, added sugar, and fat.  Reduce your daily sodium intake. Most people with hypertension should eat less than  1,500 mg of sodium a day.  Do not drink alcohol if: ? Your health care provider tells you not to drink. ? You are pregnant, may be pregnant, or are planning to become pregnant.  If you drink alcohol: ? Limit how much you use to:  0-1 drink a day for women.  0-2 drinks a day for men. ? Be aware of how much alcohol is in your drink. In the [place] one drink equals one 12 oz bottle of beer (355 mL), one 5 oz glass of wine (148 mL), or one 1 oz glass of hard liquor (44 mL). Lifestyle   Work with your health care provider to maintain a healthy body weight or to lose weight. Ask what an ideal weight is for you.  Get at least 30 minutes of exercise most days of the week. Activities may include walking, swimming, or biking.  Include exercise to strengthen your muscles (resistance exercise), such as Pilates or lifting weights, as part of your weekly exercise routine. Try to do these types of exercises for 30 minutes at least 3 days a week.  Do not use any products that contain nicotine or tobacco, such as cigarettes, e-cigarettes, and chewing tobacco. If you need help quitting, ask your health care provider.  Monitor your blood pressure at home as told by your health care provider.  Keep all follow-up visits as told by your health care provider. This is  important. Medicines  Take over-the-counter and prescription medicines only as told by your health care provider. Follow directions carefully. Blood pressure medicines must be taken as prescribed.  Do not skip doses of blood pressure medicine. Doing this puts you at risk for problems and can make the medicine less effective.  Ask your health care provider about side effects or reactions to medicines that you should watch for. Contact a health care provider if you:  Think you are having a reaction to a medicine you are taking.  Have headaches that keep coming back (recurring).  Feel dizzy.  Have swelling in your ankles.  Have trouble with your vision. Get help right away if you:  Develop a severe headache or confusion.  Have unusual weakness or numbness.  Feel faint.  Have severe pain in your chest or abdomen.  Vomit repeatedly.  Have trouble breathing. Summary  Hypertension is when the force of blood pumping through your arteries is too strong. If this condition is not controlled, it may put you at risk for serious complications.  Your personal target blood pressure may vary depending on your medical conditions, your age, and other factors. For most people, a normal blood pressure is less than 120/80.  Hypertension is treated with lifestyle changes, medicines, or a combination of both. Lifestyle changes include losing weight, eating a healthy, low-sodium diet, exercising more, and limiting alcohol. This information is not intended to replace advice given to you by your health care provider. Make sure you discuss any questions you have with your health care provider. Document Revised: 02/25/2018 Document Reviewed: 02/25/2018 Elsevier Patient Education  [place].  Diabetes Mellitus and Nutrition, Adult When you have diabetes (diabetes mellitus), it is very important to have healthy eating habits because your blood sugar (glucose) levels are greatly affected by what  you eat and drink. Eating healthy foods in the appropriate amounts, at about the same times every day, can help you:  Control your blood glucose.  Lower your risk of heart disease.  Improve your blood pressure.  Reach or  maintain a healthy weight. Every person with diabetes is different, and each person has different needs for a meal plan. Your health care provider may recommend that you work with a diet and nutrition specialist (dietitian) to make a meal plan that is best for you. Your meal plan may vary depending on factors such as:  The calories you need.  The medicines you take.  Your weight.  Your blood glucose, blood pressure, and cholesterol levels.  Your activity level.  Other health conditions you have, such as heart or kidney disease. How do carbohydrates affect me? Carbohydrates, also called carbs, affect your blood glucose level more than any other type of food. Eating carbs naturally raises the amount of glucose in your blood. Carb counting is a method for keeping track of how many carbs you eat. Counting carbs is important to keep your blood glucose at a healthy level, especially if you use insulin or take certain oral diabetes medicines. It is important to know how many carbs you can safely have in each meal. This is different for every person. Your dietitian can help you calculate how many carbs you should have at each meal and for each snack. Foods that contain carbs include:  Bread, cereal, rice, pasta, and crackers.  Potatoes and corn.  Peas, beans, and lentils.  Milk and yogurt.  Fruit and juice.  Desserts, such as cakes, cookies, ice cream, and candy. How does alcohol affect me? Alcohol can cause a sudden decrease in blood glucose (hypoglycemia), especially if you use insulin or take certain oral diabetes medicines. Hypoglycemia can be a life-threatening condition. Symptoms of hypoglycemia (sleepiness, dizziness, and confusion) are similar to symptoms of  having too much alcohol. If your health care provider says that alcohol is safe for you, follow these guidelines:  Limit alcohol intake to no more than 1 drink per day for nonpregnant women and 2 drinks per day for men. One drink equals 12 oz of beer, 5 oz of wine, or 1 oz of hard liquor.  Do not drink on an empty stomach.  Keep yourself hydrated with water, diet soda, or unsweetened iced tea.  Keep in mind that regular soda, juice, and other mixers may contain a lot of sugar and must be counted as carbs. What are tips for following this plan?  Reading food labels  Start by checking the serving size on the "Nutrition Facts" label of packaged foods and drinks. The amount of calories, carbs, fats, and other nutrients listed on the label is based on one serving of the item. Many items contain more than one serving per package.  Check the total grams (g) of carbs in one serving. You can calculate the number of servings of carbs in one serving by dividing the total carbs by 15. For example, if a food has 30 g of total carbs, it would be equal to 2 servings of carbs.  Check the number of grams (g) of saturated and trans fats in one serving. Choose foods that have low or no amount of these fats.  Check the number of milligrams (mg) of salt (sodium) in one serving. Most people should limit total sodium intake to less than 2,300 mg per day.  Always check the nutrition information of foods labeled as "low-fat" or "nonfat". These foods may be higher in added sugar or refined carbs and should be avoided.  Talk to your dietitian to identify your daily goals for nutrients listed on the label. Shopping  Avoid buying canned, premade,  or processed foods. These foods tend to be high in fat, sodium, and added sugar.  Shop around the outside edge of the grocery store. This includes fresh fruits and vegetables, bulk grains, fresh meats, and fresh dairy. Cooking  Use low-heat cooking methods, such as  baking, instead of high-heat cooking methods like deep frying.  Cook using healthy oils, such as olive, canola, or sunflower oil.  Avoid cooking with butter, cream, or high-fat meats. Meal planning  Eat meals and snacks regularly, preferably at the same times every day. Avoid going long periods of time without eating.  Eat foods high in fiber, such as fresh fruits, vegetables, beans, and whole grains. Talk to your dietitian about how many servings of carbs you can eat at each meal.  Eat 4-6 ounces (oz) of lean protein each day, such as lean meat, chicken, fish, eggs, or tofu. One oz of lean protein is equal to: ? 1 oz of meat, chicken, or fish. ? 1 egg. ?  cup of tofu.  Eat some foods each day that contain healthy fats, such as avocado, nuts, seeds, and fish. Lifestyle  Check your blood glucose regularly.  Exercise regularly as told by your health care provider. This may include: ? 150 minutes of moderate-intensity or vigorous-intensity exercise each week. This could be brisk walking, biking, or water aerobics. ? Stretching and doing strength exercises, such as yoga or weightlifting, at least 2 times a week.  Take medicines as told by your health care provider.  Do not use any products that contain nicotine or tobacco, such as cigarettes and e-cigarettes. If you need help quitting, ask your health care provider.  Work with a Social worker or diabetes educator to identify strategies to manage stress and any emotional and social challenges. Questions to ask a health care provider  Do I need to meet with a diabetes educator?  Do I need to meet with a dietitian?  What number can I call if I have questions?  When are the best times to check my blood glucose? Where to find more information:  American Diabetes Association: diabetes.org  Academy of Nutrition and Dietetics: www.eatright.[employer] of Diabetes and Digestive and Kidney Diseases (NIH):  [employer] Summary  A healthy meal plan will help you control your blood glucose and maintain a healthy lifestyle.  Working with a diet and nutrition specialist (dietitian) can help you make a meal plan that is best for you.  Keep in mind that carbohydrates (carbs) and alcohol have immediate effects on your blood glucose levels. It is important to count carbs and to use alcohol carefully. This information is not intended to replace advice given to you by your health care provider. Make sure you discuss any questions you have with your health care provider. Document Revised: 05/30/2017 Document Reviewed: 07/22/2016 Elsevier Patient Education  2020 Elsevier Inc.      Agustina Caroli, MD [facility]

## 2019-08-27 ENCOUNTER — Other Ambulatory Visit: Payer: Self-pay | Admitting: Emergency Medicine

## 2019-08-27 DIAGNOSIS — I10 Essential (primary) hypertension: Secondary | ICD-10-CM

## 2019-12-06 DIAGNOSIS — E113312 Type 2 diabetes mellitus with moderate nonproliferative diabetic retinopathy with macular edema, left eye: Secondary | ICD-10-CM | POA: Diagnosis not present

## 2019-12-06 DIAGNOSIS — H2513 Age-related nuclear cataract, bilateral: Secondary | ICD-10-CM | POA: Diagnosis not present

## 2019-12-06 DIAGNOSIS — E113391 Type 2 diabetes mellitus with moderate nonproliferative diabetic retinopathy without macular edema, right eye: Secondary | ICD-10-CM | POA: Diagnosis not present

## 2019-12-06 LAB — HM DIABETES EYE EXAM

## 2019-12-09 ENCOUNTER — Encounter: Payer: Self-pay | Admitting: *Deleted

## 2019-12-13 ENCOUNTER — Other Ambulatory Visit: Payer: Self-pay

## 2019-12-13 ENCOUNTER — Encounter (INDEPENDENT_AMBULATORY_CARE_PROVIDER_SITE_OTHER): Payer: Self-pay | Admitting: Ophthalmology

## 2019-12-13 ENCOUNTER — Ambulatory Visit (INDEPENDENT_AMBULATORY_CARE_PROVIDER_SITE_OTHER): Payer: Medicare Other | Admitting: Ophthalmology

## 2019-12-13 DIAGNOSIS — H43821 Vitreomacular adhesion, right eye: Secondary | ICD-10-CM | POA: Diagnosis not present

## 2019-12-13 DIAGNOSIS — E113412 Type 2 diabetes mellitus with severe nonproliferative diabetic retinopathy with macular edema, left eye: Secondary | ICD-10-CM | POA: Diagnosis not present

## 2019-12-13 DIAGNOSIS — H43822 Vitreomacular adhesion, left eye: Secondary | ICD-10-CM | POA: Diagnosis not present

## 2019-12-13 DIAGNOSIS — E113491 Type 2 diabetes mellitus with severe nonproliferative diabetic retinopathy without macular edema, right eye: Secondary | ICD-10-CM | POA: Insufficient documentation

## 2019-12-13 NOTE — Progress Notes (Signed)
12/13/2019     CHIEF COMPLAINT Patient presents for Retina Evaluation   HISTORY OF PRESENT ILLNESS: Scott Dodson is a 69 y.o. male who presents to the clinic today for:   HPI    Retina Evaluation    In left eye.  Duration of 1 week.  Associated Symptoms Negative for Flashes and Floaters.  Context:  distance vision.  Treatments tried include no treatments.          Comments    Referred by [facility] Patient states he is currently not having any issues with his eyes, that Dr. Katy Fitch found the problem for him. LBS unknown A1C 6.06 Aug 2019       Last edited by Gerda Diss on 12/13/2019 10:17 AM. (History)      Referring physician: Horald Pollen, MD [place],  [place] [address]  HISTORICAL INFORMATION:   Selected notes from the MEDICAL RECORD NUMBER    Lab Results  Component Value Date   HGBA1C 6.5 (A) 08/17/2019     CURRENT MEDICATIONS: No current outpatient medications on file. (Ophthalmic Drugs)   No current facility-administered medications for this visit. (Ophthalmic Drugs)   Current Outpatient Medications (Other)  Medication Sig  . ALBUTEROL IN Inhale into the lungs.  Marland Kitchen amLODipine (NORVASC) 5 MG tablet TAKE 1 TABLET BY MOUTH  DAILY  . Ascorbic Acid (VITAMIN C) 1000 MG tablet Take 2,000 mg by mouth daily.  Marland Kitchen aspirin 81 MG tablet Take 81 mg by mouth daily.  . carvedilol (COREG) 25 MG tablet TAKE 1 TABLET BY MOUTH 2  TIMES DAILY WITH A MEAL.  . cetirizine (ZYRTEC ALLERGY) 10 MG tablet Take 1 tablet (10 mg total) by mouth daily.  . Coenzyme Q10 (CO Q 10 PO) Take by mouth.  . fluticasone (FLONASE) 50 MCG/ACT nasal spray Place 2 sprays into both nostrils daily.  Marland Kitchen glipiZIDE (GLUCOTROL) 5 MG tablet Take 1 tablet (5 mg total) by mouth daily with breakfast.  . lisinopril (ZESTRIL) 40 MG tablet TAKE 1 TABLET BY MOUTH  DAILY  . metFORMIN (GLUCOPHAGE) 500 MG tablet TAKE 1 TABLET BY MOUTH  TWICE DAILY WITH A MEAL  . rosuvastatin (CRESTOR) 20 MG  tablet Take 1 tablet (20 mg total) by mouth daily.   No current facility-administered medications for this visit. (Other)      REVIEW OF SYSTEMS:    ALLERGIES No Known Allergies  PAST MEDICAL HISTORY Past Medical History:  Diagnosis Date  . Allergy   . Asthma   . Coronary artery disease    s/p lateral MI 4 /11, s/p Promus DES OM2  . Diabetes mellitus without complication ([place])   . Heart murmur   . Hyperlipidemia   . Hypertension   . Myocardial infarct [facility]) 2011   Past Surgical History:  Procedure Laterality Date  . ADENOIDECTOMY    . CORONARY ARTERY BYPASS GRAFT  2011  . CORONARY STENT PLACEMENT    . TONSILLECTOMY    . TYMPANIC MEMBRANE REPAIR Right 2004  . VASECTOMY      FAMILY HISTORY Family History  Problem Relation Age of Onset  . Heart disease Mother   . Hypertension Mother   . Heart disease Father   . Hypertension Father   . Heart disease Brother   . Heart disease Sister   . Colon cancer Maternal Grandfather 44  . Colon polyps Neg Hx   . Esophageal cancer Neg Hx   . Stomach cancer Neg Hx   .  Rectal cancer Neg Hx     SOCIAL HISTORY Social History   Tobacco Use  . Smoking status: Former Smoker    Packs/day: 0.50    Years: 5.00    Pack years: 2.50    Types: Cigarettes    Quit date: 10/14/1974    Years since quitting: 45.1  . Smokeless tobacco: Never Used  Vaping Use  . Vaping Use: Never used  Substance Use Topics  . Alcohol use: Yes    Alcohol/week: 5.0 standard drinks    Types: 5 Standard drinks or equivalent per week  . Drug use: No         OPHTHALMIC EXAM:  Base Eye Exam    Visual Acuity (Snellen - Linear)      Right Left   Dist cc 20/20 20/20-2   Correction: Glasses       Tonometry (Tonopen, 10:19 AM)      Right Left   Pressure 15 14       Visual Fields (Counting fingers)      Left Right    Full Full       Neuro/Psych    Oriented x3: Yes   Mood/Affect: Normal       Dilation    Both eyes: 1.0% Mydriacyl, 2.5%  Phenylephrine @ 10:19 AM        Slit Lamp and Fundus Exam    External Exam      Right Left   External Normal Normal       Slit Lamp Exam      Right Left   Lids/Lashes Normal Normal   Conjunctiva/Sclera White and quiet White and quiet   Cornea Clear Clear   Anterior Chamber Deep and quiet Deep and quiet   Iris Round and reactive Round and reactive   Lens 2+ Nuclear sclerosis 2+ Nuclear sclerosis   Anterior Vitreous Normal Normal       Fundus Exam      Right Left   Posterior Vitreous Normal Normal   Disc Normal Normal   C/D Ratio 0.35 0.35   Macula no hemorrhage, Microaneurysms no hemorrhage, Microaneurysms   Vessels NPDR-Severe NPDR-Severe   Periphery Normal Normal          IMAGING AND PROCEDURES  Imaging and Procedures for 12/13/19  OCT, Retina - OU - Both Eyes       Right Eye Quality was good. Scan locations included subfoveal. Central Foveal Thickness: 269. Progression has no prior data. Findings include vitreomacular adhesion , normal observations.   Left Eye Quality was good. Scan locations included subfoveal. Central Foveal Thickness: 266. Progression has no prior data. Findings include cystoid macular edema.   Notes No active CSME OD.  OS with minor diffuse CSME temporal to the fovea but also smaller superiorly. Patient will be scheduled for fundus fluorescein angiography left eye right eye.                ASSESSMENT/PLAN:  No problem-specific Assessment & Plan notes found for this encounter.      ICD-10-CM   1. Severe nonproliferative diabetic retinopathy of left eye, with macular edema, associated with type 2 diabetes mellitus (HCC)  A63.0160 OCT, Retina - OU - Both Eyes  2. Severe nonproliferative diabetic retinopathy of right eye without macular edema associated with type 2 diabetes mellitus (HCC)  E11.3491 OCT, Retina - OU - Both Eyes  3. Vitreomacular adhesion of left eye  H43.822   4. Vitreomacular adhesion of right eye  H43.821  1.  2.  3.  Ophthalmic Meds Ordered this visit:  No orders of the defined types were placed in this encounter.      Return in about 2 weeks (around 12/27/2019) for DILATE OU, OPTOS FFA L/R.  There are no Patient Instructions on file for this visit.   Explained the diagnoses, plan, and follow up with the patient and they expressed understanding.  Patient expressed understanding of the importance of proper follow up care.   Clent Demark Sidda Humm M.D. Diseases & Surgery of the Retina and Vitreous Retina & Diabetic [place] [facility] 12/13/19     Abbreviations: M myopia (nearsighted); A astigmatism; H hyperopia (farsighted); P presbyopia; Mrx spectacle prescription;  CTL contact lenses; OD right eye; OS left eye; OU both eyes  XT exotropia; ET esotropia; PEK punctate epithelial keratitis; PEE punctate epithelial erosions; DES dry eye syndrome; MGD meibomian gland dysfunction; ATs artificial tears; PFAT's preservative free artificial tears; [place] nuclear sclerotic cataract; PSC posterior subcapsular cataract; ERM epi-retinal membrane; PVD posterior vitreous detachment; RD retinal detachment; DM diabetes mellitus; DR diabetic retinopathy; NPDR non-proliferative diabetic retinopathy; PDR proliferative diabetic retinopathy; CSME clinically significant macular edema; DME diabetic macular edema; dbh dot blot hemorrhages; CWS cotton wool spot; POAG primary open angle glaucoma; C/D cup-to-disc ratio; HVF humphrey visual field; GVF goldmann visual field; OCT optical coherence tomography; IOP intraocular pressure; BRVO Branch retinal vein occlusion; CRVO central retinal vein occlusion; CRAO central retinal artery occlusion; BRAO branch retinal artery occlusion; RT retinal tear; SB scleral buckle; PPV pars plana vitrectomy; VH Vitreous hemorrhage; PRP panretinal laser photocoagulation; IVK intravitreal kenalog; VMT vitreomacular traction; MH Macular hole;  NVD neovascularization of the disc; NVE neovascularization  elsewhere; AREDS age related eye disease study; ARMD age related macular degeneration; POAG primary open angle glaucoma; EBMD epithelial/anterior basement membrane dystrophy; ACIOL anterior chamber intraocular lens; IOL intraocular lens; PCIOL posterior chamber intraocular lens; Phaco/IOL phacoemulsification with intraocular lens placement; [place] photorefractive keratectomy; LASIK laser assisted in situ keratomileusis; HTN hypertension; DM diabetes mellitus; COPD chronic obstructive pulmonary disease

## 2019-12-28 ENCOUNTER — Ambulatory Visit (INDEPENDENT_AMBULATORY_CARE_PROVIDER_SITE_OTHER): Payer: Medicare Other | Admitting: Ophthalmology

## 2019-12-28 ENCOUNTER — Encounter (INDEPENDENT_AMBULATORY_CARE_PROVIDER_SITE_OTHER): Payer: Self-pay | Admitting: Ophthalmology

## 2019-12-28 ENCOUNTER — Other Ambulatory Visit: Payer: Self-pay

## 2019-12-28 DIAGNOSIS — E113412 Type 2 diabetes mellitus with severe nonproliferative diabetic retinopathy with macular edema, left eye: Secondary | ICD-10-CM | POA: Diagnosis not present

## 2019-12-28 DIAGNOSIS — E113491 Type 2 diabetes mellitus with severe nonproliferative diabetic retinopathy without macular edema, right eye: Secondary | ICD-10-CM | POA: Diagnosis not present

## 2019-12-28 NOTE — Progress Notes (Signed)
12/28/2019     CHIEF COMPLAINT Patient presents for Retina Follow Up   HISTORY OF PRESENT ILLNESS: Scott Dodson is a 69 y.o. male who presents to the clinic today for:   HPI    Retina Follow Up    Patient presents with  Diabetic Retinopathy.  In both eyes.  Duration of 2 weeks.  Since onset it is stable.          Comments    2 week follow up - FFA L/R Patient denies change in vision and overall has no complaints.        Last edited by Gerda Diss on 12/28/2019  1:00 PM. (History)      Referring physician: Horald Pollen, MD [place],  [place] [address]  HISTORICAL INFORMATION:   Selected notes from the MEDICAL RECORD NUMBER    Lab Results  Component Value Date   HGBA1C 6.5 (A) 08/17/2019     CURRENT MEDICATIONS: No current outpatient medications on file. (Ophthalmic Drugs)   No current facility-administered medications for this visit. (Ophthalmic Drugs)   Current Outpatient Medications (Other)  Medication Sig   ALBUTEROL IN Inhale into the lungs.   amLODipine (NORVASC) 5 MG tablet TAKE 1 TABLET BY MOUTH  DAILY   Ascorbic Acid (VITAMIN C) 1000 MG tablet Take 2,000 mg by mouth daily.   aspirin 81 MG tablet Take 81 mg by mouth daily.   carvedilol (COREG) 25 MG tablet TAKE 1 TABLET BY MOUTH 2  TIMES DAILY WITH A MEAL.   cetirizine (ZYRTEC ALLERGY) 10 MG tablet Take 1 tablet (10 mg total) by mouth daily.   Coenzyme Q10 (CO Q 10 PO) Take by mouth.   fluticasone (FLONASE) 50 MCG/ACT nasal spray Place 2 sprays into both nostrils daily.   glipiZIDE (GLUCOTROL) 5 MG tablet Take 1 tablet (5 mg total) by mouth daily with breakfast.   lisinopril (ZESTRIL) 40 MG tablet TAKE 1 TABLET BY MOUTH  DAILY   metFORMIN (GLUCOPHAGE) 500 MG tablet TAKE 1 TABLET BY MOUTH  TWICE DAILY WITH A MEAL   rosuvastatin (CRESTOR) 20 MG tablet Take 1 tablet (20 mg total) by mouth daily.   No current facility-administered medications for this visit. (Other)       REVIEW OF SYSTEMS:    ALLERGIES No Known Allergies  PAST MEDICAL HISTORY Past Medical History:  Diagnosis Date   Allergy    Asthma    Coronary artery disease    s/p lateral MI 4 /11, s/p Promus DES OM2   Diabetes mellitus without complication (HCC)    Heart murmur    Hyperlipidemia    Hypertension    Myocardial infarct ([place]) 2011   Past Surgical History:  Procedure Laterality Date   ADENOIDECTOMY     CORONARY ARTERY BYPASS GRAFT  2011   CORONARY STENT PLACEMENT     TONSILLECTOMY     TYMPANIC MEMBRANE REPAIR Right 2004   VASECTOMY      FAMILY HISTORY Family History  Problem Relation Age of Onset   Heart disease Mother    Hypertension Mother    Heart disease Father    Hypertension Father    Heart disease Brother    Heart disease Sister    Colon cancer Maternal Grandfather 92   Colon polyps Neg Hx    Esophageal cancer Neg Hx    Stomach cancer Neg Hx    Rectal cancer Neg Hx     SOCIAL HISTORY Social History   Tobacco Use  Smoking status: Former Smoker    Packs/day: 0.50    Years: 5.00    Pack years: 2.50    Types: Cigarettes    Quit date: 10/14/1974    Years since quitting: 45.2   Smokeless tobacco: Never Used  Vaping Use   Vaping Use: Never used  Substance Use Topics   Alcohol use: Yes    Alcohol/week: 5.0 standard drinks    Types: 5 Standard drinks or equivalent per week   Drug use: No         OPHTHALMIC EXAM:  Base Eye Exam    Visual Acuity (Snellen - Linear)      Right Left   Dist cc 20/25-1 20/30-2       Tonometry (Tonopen, 1:05 PM)      Right Left   Pressure 23 18       Pupils      Pupils Dark Light Shape React APD   Right PERRL 4 3 Round Brisk None   Left PERRL 4 3 Round Brisk None       Visual Fields (Counting fingers)      Left Right    Full Full       Extraocular Movement      Right Left    Full Full       Neuro/Psych    Oriented x3: Yes   Mood/Affect: Normal        Dilation    Both eyes: 1.0% Mydriacyl, 2.5% Phenylephrine @ 1:05 PM        Slit Lamp and Fundus Exam    External Exam      Right Left   External Normal Normal       Slit Lamp Exam      Right Left   Lids/Lashes Normal Normal   Conjunctiva/Sclera White and quiet White and quiet   Cornea Clear Clear   Anterior Chamber Deep and quiet Deep and quiet   Iris Round and reactive Round and reactive   Lens 2+ Nuclear sclerosis 2+ Nuclear sclerosis   Anterior Vitreous Normal Normal       Fundus Exam      Right Left   Posterior Vitreous Normal Normal   Disc Normal Normal   C/D Ratio 0.35 0.45   Macula no hemorrhage, Microaneurysms no hemorrhage, Microaneurysms   Vessels NPDR-Severe NPDR-Severe   Periphery Normal Normal          IMAGING AND PROCEDURES  Imaging and Procedures for 12/28/19  Fluorescein Angiography Optos (Transit OS)       Right Eye   Progression has no prior data. Early phase findings include leakage, microaneurysm. Mid/Late phase findings include microaneurysm, leakage.   Left Eye   Progression has no prior data. Early phase findings include microaneurysm, leakage. Mid/Late phase findings include microaneurysm, leakage.   Notes OS with focal sites of triggering CSME by OCT examination.  We will offer focal laser photocoagulation left eye.  OD will offer observation alone for severe NPDR       Color Fundus Photography Optos - OU - Both Eyes       Right Eye Progression has no prior data. Macula : microaneurysms.   Left Eye Progression has no prior data. Macula : microaneurysms, edema.   Notes OD with severe nonproliferative diabetic retinopathy, OS with similar severe nonproliferative diabetic retinopathy                ASSESSMENT/PLAN:  Severe nonproliferative diabetic retinopathy of left eye, with macular edema, associated with  type 2 diabetes mellitus ([place])  The nature of diabetic macular edema was discussed with the patient.  Treatment options were outlined including medical therapy, laser & vitrectomy. The use of injectable medications reviewed, including Avastin, Lucentis, and Eylea. Periodic injections into the eye are likely to resolve diabetic macular edema (swelling in the center of vision). Initially, injections are delivered are delivered every 4-6 weeks, and the interval extended as the condition improves. On average, 8-9 injections the first year, and 5 in year 2. Improvement in the condition most often improves on medical therapy. Occasional use of focal laser is also recommended for residual macular edema (swelling). Excellent control of blood glucose and blood pressure are encouraged under the care of a primary physician or endocrinologist. Similarly, attempts to maintain serum cholesterol, low density lipoproteins, and high-density lipoproteins in a favorable range were recommended.  OS, with multifocal CSME temporal to the fovea threatening the fovea.  OCT evidence from prior examination confirmed today by fluorescein angiography.  Will offer focal laser photocoagulation to minimizetreatment burden.       ICD-10-CM   1. Severe nonproliferative diabetic retinopathy of left eye, with macular edema, associated with type 2 diabetes mellitus (HCC)  W10.2725 Fluorescein Angiography Optos (Transit OS)    Color Fundus Photography Optos - OU - Both Eyes  2. Severe nonproliferative diabetic retinopathy of right eye without macular edema associated with type 2 diabetes mellitus (HCC)  D66.4403 Fluorescein Angiography Optos (Transit OS)    Color Fundus Photography Optos - OU - Both Eyes    1.  2.  3.  Ophthalmic Meds Ordered this visit:  No orders of the defined types were placed in this encounter.      Return in about 2 weeks (around 01/11/2020) for FOCAL, OS, OCT.  There are no Patient Instructions on file for this visit.   Explained the diagnoses, plan, and follow up with the patient and they expressed  understanding.  Patient expressed understanding of the importance of proper follow up care.   Clent Demark Anne-Marie Genson M.D. Diseases & Surgery of the Retina and Vitreous Retina & Diabetic [place] 12/28/19     Abbreviations: M myopia (nearsighted); A astigmatism; H hyperopia (farsighted); P presbyopia; Mrx spectacle prescription;  CTL contact lenses; OD right eye; OS left eye; OU both eyes  XT exotropia; ET esotropia; PEK punctate epithelial keratitis; PEE punctate epithelial erosions; DES dry eye syndrome; MGD meibomian gland dysfunction; ATs artificial tears; PFAT's preservative free artificial tears; [place] nuclear sclerotic cataract; PSC posterior subcapsular cataract; ERM epi-retinal membrane; PVD posterior vitreous detachment; RD retinal detachment; DM diabetes mellitus; DR diabetic retinopathy; NPDR non-proliferative diabetic retinopathy; PDR proliferative diabetic retinopathy; CSME clinically significant macular edema; DME diabetic macular edema; dbh dot blot hemorrhages; CWS cotton wool spot; POAG primary open angle glaucoma; C/D cup-to-disc ratio; HVF humphrey visual field; GVF goldmann visual field; OCT optical coherence tomography; IOP intraocular pressure; BRVO Branch retinal vein occlusion; CRVO central retinal vein occlusion; CRAO central retinal artery occlusion; BRAO branch retinal artery occlusion; RT retinal tear; SB scleral buckle; PPV pars plana vitrectomy; VH Vitreous hemorrhage; PRP panretinal laser photocoagulation; IVK intravitreal kenalog; VMT vitreomacular traction; MH Macular hole;  NVD neovascularization of the disc; NVE neovascularization elsewhere; AREDS age related eye disease study; ARMD age related macular degeneration; POAG primary open angle glaucoma; EBMD epithelial/anterior basement membrane dystrophy; ACIOL anterior chamber intraocular lens; IOL intraocular lens; PCIOL posterior chamber intraocular lens; Phaco/IOL phacoemulsification with intraocular lens placement; PRK  photorefractive keratectomy; LASIK laser assisted in situ  keratomileusis; HTN hypertension; DM diabetes mellitus; COPD chronic obstructive pulmonary disease

## 2019-12-28 NOTE — Assessment & Plan Note (Addendum)
The nature of diabetic macular edema was discussed with the patient. Treatment options were outlined including medical therapy, laser & vitrectomy. The use of injectable medications reviewed, including Avastin, Lucentis, and Eylea. Periodic injections into the eye are likely to resolve diabetic macular edema (swelling in the center of vision). Initially, injections are delivered are delivered every 4-6 weeks, and the interval extended as the condition improves. On average, 8-9 injections the first year, and 5 in year 2. Improvement in the condition most often improves on medical therapy. Occasional use of focal laser is also recommended for residual macular edema (swelling). Excellent control of blood glucose and blood pressure are encouraged under the care of a primary physician or endocrinologist. Similarly, attempts to maintain serum cholesterol, low density lipoproteins, and high-density lipoproteins in a favorable range were recommended.  OS, with multifocal CSME temporal to the fovea threatening the fovea.  OCT evidence from prior examination confirmed today by fluorescein angiography.  Will offer focal laser photocoagulation to minimizetreatment burden.

## 2020-01-10 ENCOUNTER — Ambulatory Visit (INDEPENDENT_AMBULATORY_CARE_PROVIDER_SITE_OTHER): Payer: Medicare Other | Admitting: Ophthalmology

## 2020-01-10 ENCOUNTER — Encounter (INDEPENDENT_AMBULATORY_CARE_PROVIDER_SITE_OTHER): Payer: Self-pay | Admitting: Ophthalmology

## 2020-01-10 ENCOUNTER — Encounter (INDEPENDENT_AMBULATORY_CARE_PROVIDER_SITE_OTHER): Payer: Medicare Other | Admitting: Ophthalmology

## 2020-01-10 ENCOUNTER — Other Ambulatory Visit: Payer: Self-pay

## 2020-01-10 DIAGNOSIS — E113412 Type 2 diabetes mellitus with severe nonproliferative diabetic retinopathy with macular edema, left eye: Secondary | ICD-10-CM

## 2020-01-10 NOTE — Assessment & Plan Note (Signed)
Focal laser completed temporal to the fovea macular region left eye today

## 2020-01-10 NOTE — Progress Notes (Signed)
01/10/2020     CHIEF COMPLAINT Patient presents for Retina Follow Up   HISTORY OF PRESENT ILLNESS: Scott Dodson is a 69 y.o. male who presents to the clinic today for:   HPI    Retina Follow Up    Patient presents with  Diabetic Retinopathy.  In left eye.  Duration of 2 weeks.  Since onset it is stable.          Comments    2 week follow up - Poss Focal OS, OCT OU Patient denies change in vision and overall has no complaints. A1C 6.5         Last edited by Gerda Diss on 01/10/2020  9:32 AM. (History)      Referring physician: Horald Pollen, MD [place],   [id number]  HISTORICAL INFORMATION:   Selected notes from the MEDICAL RECORD NUMBER    Lab Results  Component Value Date   HGBA1C 6.5 (A) 08/17/2019     CURRENT MEDICATIONS: No current outpatient medications on file. (Ophthalmic Drugs)   No current facility-administered medications for this visit. (Ophthalmic Drugs)   Current Outpatient Medications (Other)  Medication Sig  . ALBUTEROL IN Inhale into the lungs.  Marland Kitchen amLODipine (NORVASC) 5 MG tablet TAKE 1 TABLET BY MOUTH  DAILY  . Ascorbic Acid (VITAMIN C) 1000 MG tablet Take 2,000 mg by mouth daily.  Marland Kitchen aspirin 81 MG tablet Take 81 mg by mouth daily.  . carvedilol (COREG) 25 MG tablet TAKE 1 TABLET BY MOUTH 2  TIMES DAILY WITH A MEAL.  . cetirizine (ZYRTEC ALLERGY) 10 MG tablet Take 1 tablet (10 mg total) by mouth daily.  . Coenzyme Q10 (CO Q 10 PO) Take by mouth.  . fluticasone (FLONASE) 50 MCG/ACT nasal spray Place 2 sprays into both nostrils daily.  Marland Kitchen glipiZIDE (GLUCOTROL) 5 MG tablet Take 1 tablet (5 mg total) by mouth daily with breakfast.  . lisinopril (ZESTRIL) 40 MG tablet TAKE 1 TABLET BY MOUTH  DAILY  . metFORMIN (GLUCOPHAGE) 500 MG tablet TAKE 1 TABLET BY MOUTH  TWICE DAILY WITH A MEAL  . rosuvastatin (CRESTOR) 20 MG tablet Take 1 tablet (20 mg total) by mouth daily.   No current facility-administered medications for  this visit. (Other)      REVIEW OF SYSTEMS:    ALLERGIES No Known Allergies  PAST MEDICAL HISTORY Past Medical History:  Diagnosis Date  . Allergy   . Asthma   . Coronary artery disease    s/p lateral MI 4 /11, s/p Promus DES OM2  . Diabetes mellitus without complication ([place])   . Heart murmur   . Hyperlipidemia   . Hypertension   . Myocardial infarct [facility]) 2011   Past Surgical History:  Procedure Laterality Date  . ADENOIDECTOMY    . CORONARY ARTERY BYPASS GRAFT  2011  . CORONARY STENT PLACEMENT    . TONSILLECTOMY    . TYMPANIC MEMBRANE REPAIR Right 2004  . VASECTOMY      FAMILY HISTORY Family History  Problem Relation Age of Onset  . Heart disease Mother   . Hypertension Mother   . Heart disease Father   . Hypertension Father   . Heart disease Brother   . Heart disease Sister   . Colon cancer Maternal Grandfather 43  . Colon polyps Neg Hx   . Esophageal cancer Neg Hx   . Stomach cancer Neg Hx   . Rectal cancer Neg Hx     SOCIAL HISTORY Social  History   Tobacco Use  . Smoking status: Former Smoker    Packs/day: 0.50    Years: 5.00    Pack years: 2.50    Types: Cigarettes    Quit date: 10/14/1974    Years since quitting: 45.2  . Smokeless tobacco: Never Used  Vaping Use  . Vaping Use: Never used  Substance Use Topics  . Alcohol use: Yes    Alcohol/week: 5.0 standard drinks    Types: 5 Standard drinks or equivalent per week  . Drug use: No         OPHTHALMIC EXAM:  Base Eye Exam    Visual Acuity (Snellen - Linear)      Right Left   Dist cc 20/20-2 20/20-1       Tonometry (Tonopen, 9:35 AM)      Right Left   Pressure 16 15       Pupils      Pupils Dark Light Shape React APD   Right PERRL 4 3 Round Brisk None   Left PERRL 4 3 Round Brisk None       Neuro/Psych    Oriented x3: Yes   Mood/Affect: Normal       Dilation    Left eye: 1.0% Mydriacyl, 2.5% Phenylephrine @ 9:35 AM        Slit Lamp and Fundus Exam    External  Exam      Right Left   External Normal Normal       Slit Lamp Exam      Right Left   Lids/Lashes Normal Normal   Conjunctiva/Sclera White and quiet White and quiet   Cornea Clear Clear   Anterior Chamber Deep and quiet Deep and quiet   Iris Round and reactive Round and reactive   Vitreous Normal Normal          IMAGING AND PROCEDURES  Imaging and Procedures for 01/10/20  OCT, Retina - OU - Both Eyes       Right Eye Quality was good. Scan locations included subfoveal. Central Foveal Thickness: 272. Progression has been stable.   Left Eye Quality was good. Central Foveal Thickness: 276. Progression has been stable.        Focal Laser - OS - Left Eye       Time Out Confirmed correct patient, procedure, site, and patient consented.   Anesthesia Topical anesthesia was used. Anesthetic medications included Proparacaine 0.5%.   Laser Information The type of laser was diode. Color was yellow. The duration in seconds was 0.07. The spot size was 100 microns. Laser power was 70. Total spots was 44.   Post-op The patient tolerated the procedure well. There were no complications. The patient received written and verbal post procedure care education.                 ASSESSMENT/PLAN:  Severe nonproliferative diabetic retinopathy of left eye, with macular edema, associated with type 2 diabetes mellitus ([place]) Focal laser completed temporal to the fovea macular region left eye today      ICD-10-CM   1. Severe nonproliferative diabetic retinopathy of left eye, with macular edema, associated with type 2 diabetes mellitus (H[facility])  M19.6222 OCT, Retina - OU - Both Eyes    Focal Laser - OS - Left Eye    1.  Focal laser completed OS today  2.  3.  Ophthalmic Meds Ordered this visit:  No orders of the defined types were placed in this encounter.  Return in about 4 months (around 05/12/2020) for DILATE OU, OCT.  There are no Patient Instructions on file for  this visit.   Explained the diagnoses, plan, and follow up with the patient and they expressed understanding.  Patient expressed understanding of the importance of proper follow up care.   Clent Demark Alyssia Heese M.D. Diseases & Surgery of the Retina and Vitreous Retina & Diabetic [place] 01/10/20     Abbreviations: M myopia (nearsighted); A astigmatism; H hyperopia (farsighted); P presbyopia; Mrx spectacle prescription;  CTL contact lenses; OD right eye; OS left eye; OU both eyes  XT exotropia; ET esotropia; PEK punctate epithelial keratitis; PEE punctate epithelial erosions; DES dry eye syndrome; MGD meibomian gland dysfunction; ATs artificial tears; PFAT's preservative free artificial tears; [place] nuclear sclerotic cataract; PSC posterior subcapsular cataract; ERM epi-retinal membrane; PVD posterior vitreous detachment; RD retinal detachment; DM diabetes mellitus; DR diabetic retinopathy; NPDR non-proliferative diabetic retinopathy; PDR proliferative diabetic retinopathy; CSME clinically significant macular edema; DME diabetic macular edema; dbh dot blot hemorrhages; CWS cotton wool spot; POAG primary open angle glaucoma; C/D cup-to-disc ratio; HVF humphrey visual field; GVF goldmann visual field; OCT optical coherence tomography; IOP intraocular pressure; BRVO Branch retinal vein occlusion; CRVO central retinal vein occlusion; CRAO central retinal artery occlusion; BRAO branch retinal artery occlusion; RT retinal tear; SB scleral buckle; PPV pars plana vitrectomy; VH Vitreous hemorrhage; PRP panretinal laser photocoagulation; IVK intravitreal kenalog; VMT vitreomacular traction; MH Macular hole;  NVD neovascularization of the disc; NVE neovascularization elsewhere; AREDS age related eye disease study; ARMD age related macular degeneration; POAG primary open angle glaucoma; EBMD epithelial/anterior basement membrane dystrophy; ACIOL anterior chamber intraocular lens; IOL intraocular lens; PCIOL posterior  chamber intraocular lens; Phaco/IOL phacoemulsification with intraocular lens placement; [place] photorefractive keratectomy; LASIK laser assisted in situ keratomileusis; HTN hypertension; DM diabetes mellitus; COPD chronic obstructive pulmonary disease

## 2020-02-07 ENCOUNTER — Other Ambulatory Visit: Payer: Self-pay | Admitting: Emergency Medicine

## 2020-02-07 DIAGNOSIS — I10 Essential (primary) hypertension: Secondary | ICD-10-CM

## 2020-02-07 NOTE — Telephone Encounter (Signed)
Requested Prescriptions  Pending Prescriptions Disp Refills   lisinopril (ZESTRIL) 40 MG tablet [Pharmacy Med Name: Lisinopril 40 MG Oral Tablet] 90 tablet 0    Sig: TAKE 1 TABLET BY MOUTH  DAILY     Cardiovascular:  ACE Inhibitors Failed - 02/07/2020 10:36 PM      Failed - Last BP in normal range    BP Readings from Last 1 Encounters:  08/17/19 (!) 153/80         Passed - Cr in normal range and within 180 days    Creatinine, Ser  Date Value Ref Range Status  08/17/2019 1.00 0.76 - 1.27 mg/dL Final         Passed - K in normal range and within 180 days    Potassium  Date Value Ref Range Status  08/17/2019 4.2 3.5 - 5.2 mmol/L Final         Passed - Patient is not pregnant      Passed - Valid encounter within last 6 months    Recent Outpatient Visits          5 months ago Hypertension associated with diabetes [facility])   Primary Care at [facility], Ines Bloomer, MD   6 months ago Medicare annual wellness visit, subsequent   Primary Care at [facility], Ranell Patrick, MD   12 months ago Type 2 diabetes mellitus with hyperglycemia, without long-term current use of insulin [facility])   Primary Care at [facility], [place], MD   1 year ago Type 2 diabetes mellitus with hyperglycemia, without long-term current use of insulin [facility])   Primary Care at [facility], Ines Bloomer, MD   1 year ago Essential hypertension   Primary Care at [facility], Ines Bloomer, MD      Future Appointments            In 1 week [place], Ines Bloomer, MD Primary Care at [place], [facility]

## 2020-02-09 ENCOUNTER — Other Ambulatory Visit: Payer: Self-pay | Admitting: Emergency Medicine

## 2020-02-09 DIAGNOSIS — I10 Essential (primary) hypertension: Secondary | ICD-10-CM

## 2020-02-15 ENCOUNTER — Other Ambulatory Visit: Payer: Self-pay

## 2020-02-15 ENCOUNTER — Encounter: Payer: Self-pay | Admitting: Emergency Medicine

## 2020-02-15 ENCOUNTER — Ambulatory Visit (INDEPENDENT_AMBULATORY_CARE_PROVIDER_SITE_OTHER): Payer: Medicare Other | Admitting: Emergency Medicine

## 2020-02-15 VITALS — BP 150/77 | HR 61 | Temp 97.9°F | Resp 16 | Ht 69.0 in | Wt 201.0 lb

## 2020-02-15 DIAGNOSIS — E1165 Type 2 diabetes mellitus with hyperglycemia: Secondary | ICD-10-CM

## 2020-02-15 DIAGNOSIS — Z23 Encounter for immunization: Secondary | ICD-10-CM

## 2020-02-15 DIAGNOSIS — E1159 Type 2 diabetes mellitus with other circulatory complications: Secondary | ICD-10-CM | POA: Diagnosis not present

## 2020-02-15 DIAGNOSIS — I10 Essential (primary) hypertension: Secondary | ICD-10-CM | POA: Diagnosis not present

## 2020-02-15 DIAGNOSIS — E1169 Type 2 diabetes mellitus with other specified complication: Secondary | ICD-10-CM

## 2020-02-15 DIAGNOSIS — I255 Ischemic cardiomyopathy: Secondary | ICD-10-CM

## 2020-02-15 DIAGNOSIS — L989 Disorder of the skin and subcutaneous tissue, unspecified: Secondary | ICD-10-CM

## 2020-02-15 DIAGNOSIS — E785 Hyperlipidemia, unspecified: Secondary | ICD-10-CM | POA: Diagnosis not present

## 2020-02-15 MED ORDER — LISINOPRIL 40 MG PO TABS: 40.0000 mg | ORAL_TABLET | Freq: Every day | ORAL | 3 refills | Status: DC

## 2020-02-15 MED ORDER — METFORMIN HCL 500 MG PO TABS: ORAL_TABLET | ORAL | 3 refills | Status: DC

## 2020-02-15 MED ORDER — CARVEDILOL 25 MG PO TABS: ORAL_TABLET | ORAL | 3 refills | Status: DC

## 2020-02-15 MED ORDER — AMLODIPINE BESYLATE 5 MG PO TABS: 5.0000 mg | ORAL_TABLET | Freq: Every day | ORAL | 3 refills | Status: DC

## 2020-02-15 MED ORDER — ROSUVASTATIN CALCIUM 20 MG PO TABS: 20.0000 mg | ORAL_TABLET | Freq: Every day | ORAL | 3 refills | Status: DC

## 2020-02-15 NOTE — Progress Notes (Signed)
Scott Dodson 69 y.o.   Chief Complaint  Patient presents with   Diabetes    follow up 6 month   Hypertension    HISTORY OF PRESENT ILLNESS: This is a 69 y.o. male with history of hypertension, diabetes, and dyslipidemia here for follow-up.  Doing well.  Has no complaints or medical concerns today.  Does not check blood pressure or glucose at home. 1.  Diabetes: On Metformin 500 mg twice a day and glipizide 5 mg in the morning. Lab Results  Component Value Date   HGBA1C 6.5 (A) 08/17/2019   2.  Hypertension: On amlodipine 5 mg daily and lisinopril 40 mg daily. BP Readings from Last 3 Encounters:  02/15/20 (!) 150/77  08/17/19 (!) 153/80  08/11/19 133/75   3.  Dyslipidemia: On Crestor 20 mg daily. 4.  Coronary atherosclerosis status post MI 2011 with 1 stent placed.  Takes 1 baby aspirin daily.  No chest pain on exertion.  Walks regularly.  On carvedilol 25 mg twice a day.  Ischemic cardiomyopathy. Overall doing well.  Has no complaints or medical concerns today.  Compliant with medications. Not vaccinated against Covid yet. HPI   Prior to Admission medications   Medication Sig Start Date End Date Taking? Authorizing Provider  ALBUTEROL IN Inhale into the lungs.   Yes [provider]  amLODipine (NORVASC) 5 MG tablet TAKE 1 TABLET BY MOUTH  DAILY 02/10/20  Yes Vladislav Axelson, Ines Bloomer, MD  Ascorbic Acid (VITAMIN C) 1000 MG tablet Take 2,000 mg by mouth daily.   Yes [provider]  aspirin 81 MG tablet Take 81 mg by mouth daily.   Yes [provider]  carvedilol (COREG) 25 MG tablet TAKE 1 TABLET BY MOUTH 2  TIMES DAILY WITH A MEAL. 02/22/19  Yes Nyeisha Goodall, Ines Bloomer, MD  cetirizine (ZYRTEC ALLERGY) 10 MG tablet Take 1 tablet (10 mg total) by mouth daily. 10/13/17  Yes Jaynee Eagles, PA-C  Coenzyme Q10 (CO Q 10 PO) Take by mouth.   Yes [provider]  fluticasone (FLONASE) 50 MCG/ACT nasal spray Place 2 sprays into both nostrils daily. 10/13/17   Yes Jaynee Eagles, PA-C  lisinopril (ZESTRIL) 40 MG tablet TAKE 1 TABLET BY MOUTH  DAILY 02/07/20  Yes Teria Khachatryan, Ines Bloomer, MD  metFORMIN (GLUCOPHAGE) 500 MG tablet TAKE 1 TABLET BY MOUTH  TWICE DAILY WITH A MEAL 06/07/19  Yes Zali Kamaka, Ines Bloomer, MD  rosuvastatin (CRESTOR) 20 MG tablet Take 1 tablet (20 mg total) by mouth daily. 08/17/19  Yes Horald Pollen, MD  glipiZIDE (GLUCOTROL) 5 MG tablet Take 1 tablet (5 mg total) by mouth daily with breakfast. 08/17/19 11/15/19  Horald Pollen, MD    No Known Allergies  Patient Active Problem List   Diagnosis Date Noted   Vitreomacular adhesion of left eye 12/13/2019   Severe nonproliferative diabetic retinopathy of right eye without macular edema associated with type 2 diabetes mellitus ([place]) 12/13/2019   Severe nonproliferative diabetic retinopathy of left eye, with macular edema, associated with type 2 diabetes mellitus ([place]) 12/13/2019   Vitreomacular adhesion of right eye 12/13/2019   Impingement syndrome of right shoulder 02/16/2019   History of diabetes mellitus 02/09/2018   CARDIOMYOPATHY, ISCHEMIC 05/18/2010   Coronary atherosclerosis 10/11/2009   Dyslipidemia associated with type 2 diabetes mellitus ([place]) 10/09/2009   HYPERLIPIDEMIA 10/09/2009   Hypertension associated with diabetes ([place]) 10/09/2009   MYOCARDIAL INFARCTION, HX OF 10/09/2009    Past Medical History:  Diagnosis Date   Allergy  Asthma    Coronary artery disease    s/p lateral MI 4 /11, s/p Promus DES OM2   Diabetes mellitus without complication ([place])    Heart murmur    Hyperlipidemia    Hypertension    Myocardial infarct [facility]) 2011    Past Surgical History:  Procedure Laterality Date   ADENOIDECTOMY     CORONARY ARTERY BYPASS GRAFT  2011   CORONARY STENT PLACEMENT     TONSILLECTOMY     TYMPANIC MEMBRANE REPAIR Right 2004   VASECTOMY      Social History   Socioeconomic History   Marital status: Married     Spouse name: Not on file   Number of children: 3   Years of education: Not on file   Highest education level: Not on file  Occupational History   Occupation: Charity fundraiser   Occupation: retired  Tobacco Use   Smoking status: Former Smoker    Packs/day: 0.50    Years: 5.00    Pack years: 2.50    Types: Cigarettes    Quit date: 10/14/1974    Years since quitting: 45.3   Smokeless tobacco: Never Used  Vaping Use   Vaping Use: Never used  Substance and Sexual Activity   Alcohol use: Yes    Alcohol/week: 5.0 standard drinks    Types: 5 Standard drinks or equivalent per week   Drug use: No   Sexual activity: Yes  Other Topics Concern   Not on file  Social History Narrative   Occupation- Photographer    Former -tobacco -stopped 1979   Married ,3 children     Alcohol use - yes , social    Drug use - no   Regular exercise- yes    Social Determinants of Radio broadcast assistant Strain:    Difficulty of Paying Living Expenses:   Food Insecurity:    Worried About Charity fundraiser in the Last Year:    Arboriculturist in the Last Year:   Transportation Needs:    Film/video editor (Medical):    Lack of Transportation (Non-Medical):   Physical Activity:    Days of Exercise per Week:    Minutes of Exercise per Session:   Stress:    Feeling of Stress :   Social Connections:    Frequency of Communication with Friends and Family:    Frequency of Social Gatherings with Friends and Family:    Attends Religious Services:    Active Member of Clubs or Organizations:    Attends Music therapist:    Marital Status:   Intimate Partner Violence:    Fear of Current or Ex-Partner:    Emotionally Abused:    Physically Abused:    Sexually Abused:     Family History  Problem Relation Age of Onset   Heart disease Mother    Hypertension Mother    Heart disease Father    Hypertension Father    Heart  disease Brother    Heart disease Sister    Colon cancer Maternal Grandfather 92   Colon polyps Neg Hx    Esophageal cancer Neg Hx    Stomach cancer Neg Hx    Rectal cancer Neg Hx      Review of Systems  Constitutional: Negative.   HENT: Negative.  Negative for congestion and sore throat.   Respiratory: Negative.  Negative for cough and shortness of breath.   Cardiovascular: Negative for chest pain  and palpitations.  Gastrointestinal: Negative.  Negative for abdominal pain, diarrhea, nausea and vomiting.  Genitourinary: Negative.  Negative for dysuria and hematuria.  Musculoskeletal: Negative.  Negative for back pain, myalgias and neck pain.  Skin:       Several skin lesions.  Requesting dermatology.  Neurological: Negative for dizziness and headaches.  All other systems reviewed and are negative.   Today's Vitals   02/15/20 0811  BP: (!) 150/77  Pulse: 61  Resp: 16  Temp: 97.9 F (36.6 C)  TempSrc: Temporal  SpO2: 96%  Weight: 201 lb (91.2 kg)  Height: 5\' 9"  (1.753 m)   Body mass index is 29.68 kg/m.  Physical Exam Vitals reviewed.  Constitutional:      Appearance: Normal appearance.  HENT:     Head: Normocephalic.  Eyes:     Extraocular Movements: Extraocular movements intact.     Conjunctiva/sclera: Conjunctivae normal.     Pupils: Pupils are equal, round, and reactive to light.  Cardiovascular:     Rate and Rhythm: Normal rate and regular rhythm.     Pulses: Normal pulses.     Heart sounds: Normal heart sounds.  Pulmonary:     Effort: Pulmonary effort is normal.     Breath sounds: Normal breath sounds.  Musculoskeletal:        General: Normal range of motion.     Cervical back: Normal range of motion and neck supple.     Right lower leg: No edema.     Left lower leg: No edema.  Skin:    General: Skin is warm and dry.     Capillary Refill: Capillary refill takes less than 2 seconds.     Findings: Lesion (Left forearm, dry scaly) present.    Neurological:     General: No focal deficit present.     Mental Status: He is alert and oriented to person, place, and time.    A total of 30 minutes was spent with the patient, greater than 50% of which was in counseling/coordination of care regarding diabetes, hypertension, dyslipidemia, cardiovascular risks associated with these conditions, review of all medications, review of most recent office visit notes, review of most recent blood work results, review of recent ophthalmology evaluation and diagnosis, review of vaccines and need for pneumonia vaccination, diet and nutrition, prognosis and need for follow-up.   ASSESSMENT & PLAN: Hypertension associated with diabetes (Scott Dodson) Clinically stable.  No medical concerns identified during this visit. Continue present medications.  No changes. Follow-up in 6 months.  Scott Dodson was seen today for diabetes and hypertension.  Diagnoses and all orders for this visit:  Hypertension associated with diabetes ([place]) -     Comprehensive metabolic panel -     Hemoglobin A1c  Dyslipidemia associated with type 2 diabetes mellitus (H[facility]) -     Lipid panel -     rosuvastatin (CRESTOR) 20 MG tablet; Take 1 tablet (20 mg total) by mouth daily.  Cardiomyopathy, ischemic  Need for prophylactic vaccination against Streptococcus pneumoniae (pneumococcus) -     Pneumococcal polysaccharide vaccine 23-valent greater than or equal to 2yo subcutaneous/IM  Essential hypertension -     amLODipine (NORVASC) 5 MG tablet; Take 1 tablet (5 mg total) by mouth daily. -     lisinopril (ZESTRIL) 40 MG tablet; Take 1 tablet (40 mg total) by mouth daily.  Type 2 diabetes mellitus with hyperglycemia, without long-term current use of insulin (H[facility]) -     metFORMIN (GLUCOPHAGE) 500 MG tablet; TAKE 1 TABLET  BY MOUTH  TWICE DAILY WITH A MEAL  Skin lesion -     Ambulatory referral to Dermatology  Other orders -     carvedilol (COREG) 25 MG tablet; TAKE 1 TABLET BY MOUTH 2   TIMES DAILY WITH A MEAL.    Patient Instructions       If you have lab work done today you will be contacted with your lab results within the next 2 weeks.  If you have not heard from [place] then please contact us. The fastest way to get your results is to register for My Chart.   IF you received an x-ray today, you will receive an invoice from [facility]. Please contact [facility] at [phone] with questions or concerns regarding your invoice.   IF you received labwork today, you will receive an invoice from [facility]. Please contact [facility] at [phone] with questions or concerns regarding your invoice.   Our billing staff will not be able to assist you with questions regarding bills from these companies.  You will be contacted with the lab results as soon as they are available. The fastest way to get your results is to activate your My Chart account. Instructions are located on the last page of this paperwork. If you have not heard from [place] regarding the results in 2 weeks, please contact this office.     Diabetes Mellitus and Nutrition, Adult When you have diabetes (diabetes mellitus), it is very important to have healthy eating habits because your blood sugar (glucose) levels are greatly affected by what you eat and drink. Eating healthy foods in the appropriate amounts, at about the same times every day, can help you:  Control your blood glucose.  Lower your risk of heart disease.  Improve your blood pressure.  Reach or maintain a healthy weight. Every person with diabetes is different, and each person has different needs for a meal plan. Your health care provider may recommend that you work with a diet and nutrition specialist (dietitian) to make a meal plan that is best for you. Your meal plan may vary depending on factors such as:  The calories you need.  The medicines you take.  Your weight.  Your blood glucose, blood pressure, and cholesterol  levels.  Your activity level.  Other health conditions you have, such as heart or kidney disease. How do carbohydrates affect me? Carbohydrates, also called carbs, affect your blood glucose level more than any other type of food. Eating carbs naturally raises the amount of glucose in your blood. Carb counting is a method for keeping track of how many carbs you eat. Counting carbs is important to keep your blood glucose at a healthy level, especially if you use insulin or take certain oral diabetes medicines. It is important to know how many carbs you can safely have in each meal. This is different for every person. Your dietitian can help you calculate how many carbs you should have at each meal and for each snack. Foods that contain carbs include:  Bread, cereal, rice, pasta, and crackers.  Potatoes and corn.  Peas, beans, and lentils.  Milk and yogurt.  Fruit and juice.  Desserts, such as cakes, cookies, ice cream, and candy. How does alcohol affect me? Alcohol can cause a sudden decrease in blood glucose (hypoglycemia), especially if you use insulin or take certain oral diabetes medicines. Hypoglycemia can be a life-threatening condition. Symptoms of hypoglycemia (sleepiness, dizziness, and confusion) are similar to symptoms of having too much alcohol.  If your health care provider says that alcohol is safe for you, follow these guidelines:  Limit alcohol intake to no more than 1 drink per day for nonpregnant women and 2 drinks per day for men. One drink equals 12 oz of beer, 5 oz of wine, or 1 oz of hard liquor.  Do not drink on an empty stomach.  Keep yourself hydrated with water, diet soda, or unsweetened iced tea.  Keep in mind that regular soda, juice, and other mixers may contain a lot of sugar and must be counted as carbs. What are tips for following this plan?  Reading food labels  Start by checking the serving size on the "Nutrition Facts" label of packaged foods and  drinks. The amount of calories, carbs, fats, and other nutrients listed on the label is based on one serving of the item. Many items contain more than one serving per package.  Check the total grams (g) of carbs in one serving. You can calculate the number of servings of carbs in one serving by dividing the total carbs by 15. For example, if a food has 30 g of total carbs, it would be equal to 2 servings of carbs.  Check the number of grams (g) of saturated and trans fats in one serving. Choose foods that have low or no amount of these fats.  Check the number of milligrams (mg) of salt (sodium) in one serving. Most people should limit total sodium intake to less than 2,300 mg per day.  Always check the nutrition information of foods labeled as "low-fat" or "nonfat". These foods may be higher in added sugar or refined carbs and should be avoided.  Talk to your dietitian to identify your daily goals for nutrients listed on the label. Shopping  Avoid buying canned, premade, or processed foods. These foods tend to be high in fat, sodium, and added sugar.  Shop around the outside edge of the grocery store. This includes fresh fruits and vegetables, bulk grains, fresh meats, and fresh dairy. Cooking  Use low-heat cooking methods, such as baking, instead of high-heat cooking methods like deep frying.  Cook using healthy oils, such as olive, canola, or sunflower oil.  Avoid cooking with butter, cream, or high-fat meats. Meal planning  Eat meals and snacks regularly, preferably at the same times every day. Avoid going long periods of time without eating.  Eat foods high in fiber, such as fresh fruits, vegetables, beans, and whole grains. Talk to your dietitian about how many servings of carbs you can eat at each meal.  Eat 4-6 ounces (oz) of lean protein each day, such as lean meat, chicken, fish, eggs, or tofu. One oz of lean protein is equal to: ? 1 oz of meat, chicken, or fish. ? 1 egg. ?   cup of tofu.  Eat some foods each day that contain healthy fats, such as avocado, nuts, seeds, and fish. Lifestyle  Check your blood glucose regularly.  Exercise regularly as told by your health care provider. This may include: ? 150 minutes of moderate-intensity or vigorous-intensity exercise each week. This could be brisk walking, biking, or water aerobics. ? Stretching and doing strength exercises, such as yoga or weightlifting, at least 2 times a week.  Take medicines as told by your health care provider.  Do not use any products that contain nicotine or tobacco, such as cigarettes and e-cigarettes. If you need help quitting, ask your health care provider.  Work with a Social worker or diabetes educator  to identify strategies to manage stress and any emotional and social challenges. Questions to ask a health care provider  Do I need to meet with a diabetes educator?  Do I need to meet with a dietitian?  What number can I call if I have questions?  When are the best times to check my blood glucose? Where to find more information:  American Diabetes Association: diabetes.org  Academy of Nutrition and Dietetics: www.eatright.[employer] of Diabetes and Digestive and Kidney Diseases (NIH): [employer] Summary  A healthy meal plan will help you control your blood glucose and maintain a healthy lifestyle.  Working with a diet and nutrition specialist (dietitian) can help you make a meal plan that is best for you.  Keep in mind that carbohydrates (carbs) and alcohol have immediate effects on your blood glucose levels. It is important to count carbs and to use alcohol carefully. This information is not intended to replace advice given to you by your health care provider. Make sure you discuss any questions you have with your health care provider. Document Revised: 05/30/2017 Document Reviewed: 07/22/2016 Elsevier Patient Education  [place].  Hypertension, Adult High blood pressure (hypertension) is when the force of blood pumping through the arteries is too strong. The arteries are the blood vessels that carry blood from the heart throughout the body. Hypertension forces the heart to work harder to pump blood and may cause arteries to become narrow or stiff. Untreated or uncontrolled hypertension can cause a heart attack, heart failure, a stroke, kidney disease, and other problems. A blood pressure reading consists of a higher number over a lower number. Ideally, your blood pressure should be below 120/80. The first ("top") number is called the systolic pressure. It is a measure of the pressure in your arteries as your heart beats. The second ("bottom") number is called the diastolic pressure. It is a measure of the pressure in your arteries as the heart relaxes. What are the causes? The exact cause of this condition is not known. There are some conditions that result in or are related to high blood pressure. What increases the risk? Some risk factors for high blood pressure are under your control. The following factors may make you more likely to develop this condition:  Smoking.  Having type 2 diabetes mellitus, high cholesterol, or both.  Not getting enough exercise or physical activity.  Being overweight.  Having too much fat, sugar, calories, or salt (sodium) in your diet.  Drinking too much alcohol. Some risk factors for high blood pressure may be difficult or impossible to change. Some of these factors include:  Having chronic kidney disease.  Having a family history of high blood pressure.  Age. Risk increases with age.  Race. You may be at higher risk if you are African American.  Gender. Men are at higher risk than women before age 47. After age 36, women are at higher risk than men.  Having obstructive sleep apnea.  Stress. What are the signs or symptoms? High blood pressure may not cause symptoms.  Very high blood pressure (hypertensive crisis) may cause:  Headache.  Anxiety.  Shortness of breath.  Nosebleed.  Nausea and vomiting.  Vision changes.  Severe chest pain.  Seizures. How is this diagnosed? This condition is diagnosed by measuring your blood pressure while you are seated, with your arm resting on a flat surface, your legs uncrossed, and your feet flat on the floor. The cuff of the blood pressure monitor  will be placed directly against the skin of your upper arm at the level of your heart. It should be measured at least twice using the same arm. Certain conditions can cause a difference in blood pressure between your right and left arms. Certain factors can cause blood pressure readings to be lower or higher than normal for a short period of time:  When your blood pressure is higher when you are in a health care provider's office than when you are at home, this is called white coat hypertension. Most people with this condition do not need medicines.  When your blood pressure is higher at home than when you are in a health care provider's office, this is called masked hypertension. Most people with this condition may need medicines to control blood pressure. If you have a high blood pressure reading during one visit or you have normal blood pressure with other risk factors, you may be asked to:  Return on a different day to have your blood pressure checked again.  Monitor your blood pressure at home for 1 week or longer. If you are diagnosed with hypertension, you may have other blood or imaging tests to help your health care provider understand your overall risk for other conditions. How is this treated? This condition is treated by making healthy lifestyle changes, such as eating healthy foods, exercising more, and reducing your alcohol intake. Your health care provider may prescribe medicine if lifestyle changes are not enough to get your blood pressure under control,  and if:  Your systolic blood pressure is above 130.  Your diastolic blood pressure is above 80. Your personal target blood pressure may vary depending on your medical conditions, your age, and other factors. Follow these instructions at home: Eating and drinking   Eat a diet that is high in fiber and potassium, and low in sodium, added sugar, and fat. An example eating plan is called the DASH (Dietary Approaches to Stop Hypertension) diet. To eat this way: ? Eat plenty of fresh fruits and vegetables. Try to fill one half of your plate at each meal with fruits and vegetables. ? Eat whole grains, such as whole-wheat pasta, brown rice, or whole-grain bread. Fill about one fourth of your plate with whole grains. ? Eat or drink low-fat dairy products, such as skim milk or low-fat yogurt. ? Avoid fatty cuts of meat, processed or cured meats, and poultry with skin. Fill about one fourth of your plate with lean proteins, such as fish, chicken without skin, beans, eggs, or tofu. ? Avoid pre-made and processed foods. These tend to be higher in sodium, added sugar, and fat.  Reduce your daily sodium intake. Most people with hypertension should eat less than 1,500 mg of sodium a day.  Do not drink alcohol if: ? Your health care provider tells you not to drink. ? You are pregnant, may be pregnant, or are planning to become pregnant.  If you drink alcohol: ? Limit how much you use to:  0-1 drink a day for women.  0-2 drinks a day for men. ? Be aware of how much alcohol is in your drink. In the [place] one drink equals one 12 oz bottle of beer (355 mL), one 5 oz glass of wine (148 mL), or one 1 oz glass of hard liquor (44 mL). Lifestyle   Work with your health care provider to maintain a healthy body weight or to lose weight. Ask what an ideal weight is for you.  Get at  least 30 minutes of exercise most days of the week. Activities may include walking, swimming, or biking.  Include exercise to  strengthen your muscles (resistance exercise), such as Pilates or lifting weights, as part of your weekly exercise routine. Try to do these types of exercises for 30 minutes at least 3 days a week.  Do not use any products that contain nicotine or tobacco, such as cigarettes, e-cigarettes, and chewing tobacco. If you need help quitting, ask your health care provider.  Monitor your blood pressure at home as told by your health care provider.  Keep all follow-up visits as told by your health care provider. This is important. Medicines  Take over-the-counter and prescription medicines only as told by your health care provider. Follow directions carefully. Blood pressure medicines must be taken as prescribed.  Do not skip doses of blood pressure medicine. Doing this puts you at risk for problems and can make the medicine less effective.  Ask your health care provider about side effects or reactions to medicines that you should watch for. Contact a health care provider if you:  Think you are having a reaction to a medicine you are taking.  Have headaches that keep coming back (recurring).  Feel dizzy.  Have swelling in your ankles.  Have trouble with your vision. Get help right away if you:  Develop a severe headache or confusion.  Have unusual weakness or numbness.  Feel faint.  Have severe pain in your chest or abdomen.  Vomit repeatedly.  Have trouble breathing. Summary  Hypertension is when the force of blood pumping through your arteries is too strong. If this condition is not controlled, it may put you at risk for serious complications.  Your personal target blood pressure may vary depending on your medical conditions, your age, and other factors. For most people, a normal blood pressure is less than 120/80.  Hypertension is treated with lifestyle changes, medicines, or a combination of both. Lifestyle changes include losing weight, eating a healthy, low-sodium diet,  exercising more, and limiting alcohol. This information is not intended to replace advice given to you by your health care provider. Make sure you discuss any questions you have with your health care provider. Document Revised: 02/25/2018 Document Reviewed: 02/25/2018 Elsevier Patient Education  2020 Elsevier Inc.      Agustina Caroli, MD [facility]

## 2020-02-15 NOTE — Patient Instructions (Addendum)
   If you have lab work done today you will be contacted with your lab results within the next 2 weeks.  If you have not heard from us then please contact us. The fastest way to get your results is to register for My Chart.   IF you received an x-ray today, you will receive an invoice from [facility]. Please contact [facility] at [phone] with questions or concerns regarding your invoice.   IF you received labwork today, you will receive an invoice from [facility]. Please contact [facility] at [phone] with questions or concerns regarding your invoice.   Our billing staff will not be able to assist you with questions regarding bills from these companies.  You will be contacted with the lab results as soon as they are available. The fastest way to get your results is to activate your My Chart account. Instructions are located on the last page of this paperwork. If you have not heard from us regarding the results in 2 weeks, please contact this office.     Diabetes Mellitus and Nutrition, Adult When you have diabetes (diabetes mellitus), it is very important to have healthy eating habits because your blood sugar (glucose) levels are greatly affected by what you eat and drink. Eating healthy foods in the appropriate amounts, at about the same times every day, can help you:  Control your blood glucose.  Lower your risk of heart disease.  Improve your blood pressure.  Reach or maintain a healthy weight. Every person with diabetes is different, and each person has different needs for a meal plan. Your health care provider may recommend that you work with a diet and nutrition specialist (dietitian) to make a meal plan that is best for you. Your meal plan may vary depending on factors such as:  The calories you need.  The medicines you take.  Your weight.  Your blood glucose, blood pressure, and cholesterol levels.  Your activity level.  Other health conditions  you have, such as heart or kidney disease. How do carbohydrates affect me? Carbohydrates, also called carbs, affect your blood glucose level more than any other type of food. Eating carbs naturally raises the amount of glucose in your blood. Carb counting is a method for keeping track of how many carbs you eat. Counting carbs is important to keep your blood glucose at a healthy level, especially if you use insulin or take certain oral diabetes medicines. It is important to know how many carbs you can safely have in each meal. This is different for every person. Your dietitian can help you calculate how many carbs you should have at each meal and for each snack. Foods that contain carbs include:  Bread, cereal, rice, pasta, and crackers.  Potatoes and corn.  Peas, beans, and lentils.  Milk and yogurt.  Fruit and juice.  Desserts, such as cakes, cookies, ice cream, and candy. How does alcohol affect me? Alcohol can cause a sudden decrease in blood glucose (hypoglycemia), especially if you use insulin or take certain oral diabetes medicines. Hypoglycemia can be a life-threatening condition. Symptoms of hypoglycemia (sleepiness, dizziness, and confusion) are similar to symptoms of having too much alcohol. If your health care provider says that alcohol is safe for you, follow these guidelines:  Limit alcohol intake to no more than 1 drink per day for nonpregnant women and 2 drinks per day for men. One drink equals 12 oz of beer, 5 oz of wine, or 1 oz of hard liquor.    Do not drink on an empty stomach.  Keep yourself hydrated with water, diet soda, or unsweetened iced tea.  Keep in mind that regular soda, juice, and other mixers may contain a lot of sugar and must be counted as carbs. What are tips for following this plan?  Reading food labels  Start by checking the serving size on the "Nutrition Facts" label of packaged foods and drinks. The amount of calories, carbs, fats, and other  nutrients listed on the label is based on one serving of the item. Many items contain more than one serving per package.  Check the total grams (g) of carbs in one serving. You can calculate the number of servings of carbs in one serving by dividing the total carbs by 15. For example, if a food has 30 g of total carbs, it would be equal to 2 servings of carbs.  Check the number of grams (g) of saturated and trans fats in one serving. Choose foods that have low or no amount of these fats.  Check the number of milligrams (mg) of salt (sodium) in one serving. Most people should limit total sodium intake to less than 2,300 mg per day.  Always check the nutrition information of foods labeled as "low-fat" or "nonfat". These foods may be higher in added sugar or refined carbs and should be avoided.  Talk to your dietitian to identify your daily goals for nutrients listed on the label. Shopping  Avoid buying canned, premade, or processed foods. These foods tend to be high in fat, sodium, and added sugar.  Shop around the outside edge of the grocery store. This includes fresh fruits and vegetables, bulk grains, fresh meats, and fresh dairy. Cooking  Use low-heat cooking methods, such as baking, instead of high-heat cooking methods like deep frying.  Cook using healthy oils, such as olive, canola, or sunflower oil.  Avoid cooking with butter, cream, or high-fat meats. Meal planning  Eat meals and snacks regularly, preferably at the same times every day. Avoid going long periods of time without eating.  Eat foods high in fiber, such as fresh fruits, vegetables, beans, and whole grains. Talk to your dietitian about how many servings of carbs you can eat at each meal.  Eat 4-6 ounces (oz) of lean protein each day, such as lean meat, chicken, fish, eggs, or tofu. One oz of lean protein is equal to: ? 1 oz of meat, chicken, or fish. ? 1 egg. ?  cup of tofu.  Eat some foods each day that contain  healthy fats, such as avocado, nuts, seeds, and fish. Lifestyle  Check your blood glucose regularly.  Exercise regularly as told by your health care provider. This may include: ? 150 minutes of moderate-intensity or vigorous-intensity exercise each week. This could be brisk walking, biking, or water aerobics. ? Stretching and doing strength exercises, such as yoga or weightlifting, at least 2 times a week.  Take medicines as told by your health care provider.  Do not use any products that contain nicotine or tobacco, such as cigarettes and e-cigarettes. If you need help quitting, ask your health care provider.  Work with a counselor or diabetes educator to identify strategies to manage stress and any emotional and social challenges. Questions to ask a health care provider  Do I need to meet with a diabetes educator?  Do I need to meet with a dietitian?  What number can I call if I have questions?  When are the best times to   check my blood glucose? Where to find more information:  American Diabetes Association: diabetes.org  Academy of Nutrition and Dietetics: www.eatright.org  National Institute of Diabetes and Digestive and Kidney Diseases (NIH): www.niddk.nih.gov Summary  A healthy meal plan will help you control your blood glucose and maintain a healthy lifestyle.  Working with a diet and nutrition specialist (dietitian) can help you make a meal plan that is best for you.  Keep in mind that carbohydrates (carbs) and alcohol have immediate effects on your blood glucose levels. It is important to count carbs and to use alcohol carefully. This information is not intended to replace advice given to you by your health care provider. Make sure you discuss any questions you have with your health care provider. Document Revised: 05/30/2017 Document Reviewed: 07/22/2016 Elsevier Patient Education  2020 Elsevier Inc.  Hypertension, Adult High blood pressure (hypertension) is when  the force of blood pumping through the arteries is too strong. The arteries are the blood vessels that carry blood from the heart throughout the body. Hypertension forces the heart to work harder to pump blood and may cause arteries to become narrow or stiff. Untreated or uncontrolled hypertension can cause a heart attack, heart failure, a stroke, kidney disease, and other problems. A blood pressure reading consists of a higher number over a lower number. Ideally, your blood pressure should be below 120/80. The first ("top") number is called the systolic pressure. It is a measure of the pressure in your arteries as your heart beats. The second ("bottom") number is called the diastolic pressure. It is a measure of the pressure in your arteries as the heart relaxes. What are the causes? The exact cause of this condition is not known. There are some conditions that result in or are related to high blood pressure. What increases the risk? Some risk factors for high blood pressure are under your control. The following factors may make you more likely to develop this condition:  Smoking.  Having type 2 diabetes mellitus, high cholesterol, or both.  Not getting enough exercise or physical activity.  Being overweight.  Having too much fat, sugar, calories, or salt (sodium) in your diet.  Drinking too much alcohol. Some risk factors for high blood pressure may be difficult or impossible to change. Some of these factors include:  Having chronic kidney disease.  Having a family history of high blood pressure.  Age. Risk increases with age.  Race. You may be at higher risk if you are African American.  Gender. Men are at higher risk than women before age 45. After age 65, women are at higher risk than men.  Having obstructive sleep apnea.  Stress. What are the signs or symptoms? High blood pressure may not cause symptoms. Very high blood pressure (hypertensive crisis) may  cause:  Headache.  Anxiety.  Shortness of breath.  Nosebleed.  Nausea and vomiting.  Vision changes.  Severe chest pain.  Seizures. How is this diagnosed? This condition is diagnosed by measuring your blood pressure while you are seated, with your arm resting on a flat surface, your legs uncrossed, and your feet flat on the floor. The cuff of the blood pressure monitor will be placed directly against the skin of your upper arm at the level of your heart. It should be measured at least twice using the same arm. Certain conditions can cause a difference in blood pressure between your right and left arms. Certain factors can cause blood pressure readings to be lower or higher   than normal for a short period of time:  When your blood pressure is higher when you are in a health care provider's office than when you are at home, this is called white coat hypertension. Most people with this condition do not need medicines.  When your blood pressure is higher at home than when you are in a health care provider's office, this is called masked hypertension. Most people with this condition may need medicines to control blood pressure. If you have a high blood pressure reading during one visit or you have normal blood pressure with other risk factors, you may be asked to:  Return on a different day to have your blood pressure checked again.  Monitor your blood pressure at home for 1 week or longer. If you are diagnosed with hypertension, you may have other blood or imaging tests to help your health care provider understand your overall risk for other conditions. How is this treated? This condition is treated by making healthy lifestyle changes, such as eating healthy foods, exercising more, and reducing your alcohol intake. Your health care provider may prescribe medicine if lifestyle changes are not enough to get your blood pressure under control, and if:  Your systolic blood pressure is above  130.  Your diastolic blood pressure is above 80. Your personal target blood pressure may vary depending on your medical conditions, your age, and other factors. Follow these instructions at home: Eating and drinking   Eat a diet that is high in fiber and potassium, and low in sodium, added sugar, and fat. An example eating plan is called the DASH (Dietary Approaches to Stop Hypertension) diet. To eat this way: ? Eat plenty of fresh fruits and vegetables. Try to fill one half of your plate at each meal with fruits and vegetables. ? Eat whole grains, such as whole-wheat pasta, brown rice, or whole-grain bread. Fill about one fourth of your plate with whole grains. ? Eat or drink low-fat dairy products, such as skim milk or low-fat yogurt. ? Avoid fatty cuts of meat, processed or cured meats, and poultry with skin. Fill about one fourth of your plate with lean proteins, such as fish, chicken without skin, beans, eggs, or tofu. ? Avoid pre-made and processed foods. These tend to be higher in sodium, added sugar, and fat.  Reduce your daily sodium intake. Most people with hypertension should eat less than 1,500 mg of sodium a day.  Do not drink alcohol if: ? Your health care provider tells you not to drink. ? You are pregnant, may be pregnant, or are planning to become pregnant.  If you drink alcohol: ? Limit how much you use to:  0-1 drink a day for women.  0-2 drinks a day for men. ? Be aware of how much alcohol is in your drink. In the [place] one drink equals one 12 oz bottle of beer (355 mL), one 5 oz glass of wine (148 mL), or one 1 oz glass of hard liquor (44 mL). Lifestyle   Work with your health care provider to maintain a healthy body weight or to lose weight. Ask what an ideal weight is for you.  Get at least 30 minutes of exercise most days of the week. Activities may include walking, swimming, or biking.  Include exercise to strengthen your muscles (resistance exercise),  such as Pilates or lifting weights, as part of your weekly exercise routine. Try to do these types of exercises for 30 minutes at least 3 days a   week.  Do not use any products that contain nicotine or tobacco, such as cigarettes, e-cigarettes, and chewing tobacco. If you need help quitting, ask your health care provider.  Monitor your blood pressure at home as told by your health care provider.  Keep all follow-up visits as told by your health care provider. This is important. Medicines  Take over-the-counter and prescription medicines only as told by your health care provider. Follow directions carefully. Blood pressure medicines must be taken as prescribed.  Do not skip doses of blood pressure medicine. Doing this puts you at risk for problems and can make the medicine less effective.  Ask your health care provider about side effects or reactions to medicines that you should watch for. Contact a health care provider if you:  Think you are having a reaction to a medicine you are taking.  Have headaches that keep coming back (recurring).  Feel dizzy.  Have swelling in your ankles.  Have trouble with your vision. Get help right away if you:  Develop a severe headache or confusion.  Have unusual weakness or numbness.  Feel faint.  Have severe pain in your chest or abdomen.  Vomit repeatedly.  Have trouble breathing. Summary  Hypertension is when the force of blood pumping through your arteries is too strong. If this condition is not controlled, it may put you at risk for serious complications.  Your personal target blood pressure may vary depending on your medical conditions, your age, and other factors. For most people, a normal blood pressure is less than 120/80.  Hypertension is treated with lifestyle changes, medicines, or a combination of both. Lifestyle changes include losing weight, eating a healthy, low-sodium diet, exercising more, and limiting alcohol. This  information is not intended to replace advice given to you by your health care provider. Make sure you discuss any questions you have with your health care provider. Document Revised: 02/25/2018 Document Reviewed: 02/25/2018 Elsevier Patient Education  2020 [employer] Inc.  

## 2020-02-15 NOTE — Assessment & Plan Note (Signed)
Clinically stable.  No medical concerns identified during this visit. Continue present medications.  No changes. Follow-up in 6 months.

## 2020-02-16 LAB — LIPID PANEL
Chol/HDL Ratio: 2.6 ratio (ref 0.0–5.0)
Cholesterol, Total: 129 mg/dL (ref 100–199)
HDL: 50 mg/dL (ref >39)
LDL Chol Calc (NIH): 53 mg/dL (ref 0–99)
Triglycerides: 154 mg/dL — ABNORMAL HIGH (ref 0–149)
VLDL Cholesterol Cal: 26 mg/dL (ref 5–40)

## 2020-02-16 LAB — COMPREHENSIVE METABOLIC PANEL
ALT: 17 IU/L (ref 0–44)
AST: 16 IU/L (ref 0–40)
Albumin/Globulin Ratio: 2.4 — ABNORMAL HIGH (ref 1.2–2.2)
Albumin: 4.6 g/dL (ref 3.8–4.8)
Alkaline Phosphatase: 65 IU/L (ref 48–121)
BUN/Creatinine Ratio: 13 (ref 10–24)
BUN: 14 mg/dL (ref 8–27)
Bilirubin Total: 0.4 mg/dL (ref 0.0–1.2)
CO2: 22 mmol/L (ref 20–29)
Calcium: 9.6 mg/dL (ref 8.6–10.2)
Chloride: 105 mmol/L (ref 96–106)
Creatinine, Ser: 1.07 mg/dL (ref 0.76–1.27)
GFR calc Af Amer: 83 mL/min/{1.73_m2} (ref >59)
GFR calc non Af Amer: 71 mL/min/{1.73_m2} (ref >59)
Globulin, Total: 1.9 g/dL (ref 1.5–4.5)
Glucose: 120 mg/dL — ABNORMAL HIGH (ref 65–99)
Potassium: 4.5 mmol/L (ref 3.5–5.2)
Sodium: 142 mmol/L (ref 134–144)
Total Protein: 6.5 g/dL (ref 6.0–8.5)

## 2020-02-16 LAB — HEMOGLOBIN A1C
Est. average glucose Bld gHb Est-mCnc: 148 mg/dL
Hgb A1c MFr Bld: 6.8 % — ABNORMAL HIGH (ref 4.8–5.6)

## 2020-04-12 DIAGNOSIS — C44629 Squamous cell carcinoma of skin of left upper limb, including shoulder: Secondary | ICD-10-CM | POA: Diagnosis not present

## 2020-04-12 DIAGNOSIS — D0462 Carcinoma in situ of skin of left upper limb, including shoulder: Secondary | ICD-10-CM | POA: Diagnosis not present

## 2020-04-26 ENCOUNTER — Encounter: Payer: Self-pay | Admitting: Emergency Medicine

## 2020-04-26 ENCOUNTER — Ambulatory Visit (INDEPENDENT_AMBULATORY_CARE_PROVIDER_SITE_OTHER): Payer: Medicare Other | Admitting: Emergency Medicine

## 2020-04-26 ENCOUNTER — Other Ambulatory Visit: Payer: Self-pay

## 2020-04-26 VITALS — BP 153/81 | HR 54 | Temp 97.5°F | Ht 69.0 in | Wt 206.4 lb

## 2020-04-26 DIAGNOSIS — S4991XA Unspecified injury of right shoulder and upper arm, initial encounter: Secondary | ICD-10-CM

## 2020-04-26 DIAGNOSIS — M25511 Pain in right shoulder: Secondary | ICD-10-CM | POA: Diagnosis not present

## 2020-04-26 DIAGNOSIS — T07XXXA Unspecified multiple injuries, initial encounter: Secondary | ICD-10-CM | POA: Diagnosis not present
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
## 2020-04-26 NOTE — Patient Instructions (Addendum)
If you have lab work done today you will be contacted with your lab results within the next 2 weeks.  If you have not heard from [place] then please contact us. The fastest way to get your results is to register for My Chart.   IF you received an x-ray today, you will receive an invoice from [facility]. Please contact [facility] at [phone] with questions or concerns regarding your invoice.   IF you received labwork today, you will receive an invoice from [facility]. Please contact [facility] at [phone] with questions or concerns regarding your invoice.   Our billing staff will not be able to assist you with questions regarding bills from these companies.  You will be contacted with the lab results as soon as they are available. The fastest way to get your results is to activate your My Chart account. Instructions are located on the last page of this paperwork. If you have not heard from [place] regarding the results in 2 weeks, please contact this office.      Contusion A contusion is a deep bruise. This is a result of an injury that causes bleeding under the skin. Symptoms of bruising include pain, swelling, and discolored skin. The skin may turn blue, purple, or yellow. Follow these instructions at home: Managing pain, stiffness, and swelling You may use RICE. This stands for:  Resting.  Icing.  Compression, or putting pressure.  Elevating, or raising the injured area. To follow this method, do these actions:  Rest the injured area.  If told, put ice on the injured area. ? Put ice in a plastic bag. ? Place a towel between your skin and the bag. ? Leave the ice on for 20 minutes, 2-3 times per day.  If told, put light pressure (compression) on the injured area using an elastic bandage. Make sure the bandage is not too tight. If the area tingles or becomes numb, remove it and put it back on as told by your doctor.  If possible, raise (elevate) the injured  area above the level of your heart while you are sitting or lying down.  General instructions  Take over-the-counter and prescription medicines only as told by your doctor.  Keep all follow-up visits as told by your doctor. This is important. Contact a doctor if:  Your symptoms do not get better after several days of treatment.  Your symptoms get worse.  You have trouble moving the injured area. Get help right away if:  You have very bad pain.  You have a loss of feeling (numbness) in a hand or foot.  Your hand or foot turns pale or cold. Summary  A contusion is a deep bruise. This is a result of an injury that causes bleeding under the skin.  Symptoms of bruising include pain, swelling, and discolored skin. The skin may turn blue, purple, or yellow.  This condition is treated with rest, ice, compression, and elevation. This is also called RICE. You may be given over-the-counter medicines for pain.  Contact a doctor if you do not feel better, or you feel worse. Get help right away if you have very bad pain, have lost feeling in a hand or foot, or the area turns pale or cold. This information is not intended to replace advice given to you by your health care provider. Make sure you discuss any questions you have with your health care provider. Document Revised: 02/06/2018 Document Reviewed: 02/06/2018 Elsevier Patient Education  2020 Elsevier  Inc.  

## 2020-05-01 ENCOUNTER — Encounter (INDEPENDENT_AMBULATORY_CARE_PROVIDER_SITE_OTHER): Payer: Medicare Other | Admitting: Ophthalmology

## 2020-05-04 ENCOUNTER — Encounter: Payer: Self-pay | Admitting: Emergency Medicine

## 2020-05-15 ENCOUNTER — Encounter (INDEPENDENT_AMBULATORY_CARE_PROVIDER_SITE_OTHER): Payer: Self-pay | Admitting: Ophthalmology

## 2020-05-15 ENCOUNTER — Other Ambulatory Visit: Payer: Self-pay

## 2020-05-15 ENCOUNTER — Ambulatory Visit (INDEPENDENT_AMBULATORY_CARE_PROVIDER_SITE_OTHER): Payer: Medicare Other | Admitting: Ophthalmology

## 2020-05-15 DIAGNOSIS — E113491 Type 2 diabetes mellitus with severe nonproliferative diabetic retinopathy without macular edema, right eye: Secondary | ICD-10-CM

## 2020-05-15 DIAGNOSIS — E113412 Type 2 diabetes mellitus with severe nonproliferative diabetic retinopathy with macular edema, left eye: Secondary | ICD-10-CM | POA: Diagnosis not present

## 2020-05-15 NOTE — Progress Notes (Signed)
05/15/2020     CHIEF COMPLAINT Patient presents for Retina Follow Up   HISTORY OF PRESENT ILLNESS: Scott Dodson is a 69 y.o. male who presents to the clinic today for:   HPI    Retina Follow Up    Patient presents with  Diabetic Retinopathy.  In both eyes.  This started 4 months ago.  Severity is mild.  Duration of 4 months.  Since onset it is stable.          Comments    4 MO F/U OU   No new F/F, no eye pain.   A1C: 6.7 01/2020 Last BS: pt doesn't check        Last edited by Nichola Sizer D on 05/15/2020  9:24 AM. (History)      Referring physician: Horald Pollen, MD [place],  [place] [address]  HISTORICAL INFORMATION:   Selected notes from the MEDICAL RECORD NUMBER    Lab Results  Component Value Date   HGBA1C 6.8 (H) 02/15/2020     CURRENT MEDICATIONS: No current outpatient medications on file. (Ophthalmic Drugs)   No current facility-administered medications for this visit. (Ophthalmic Drugs)   Current Outpatient Medications (Other)  Medication Sig  . ALBUTEROL IN Inhale into the lungs.  Marland Kitchen amLODipine (NORVASC) 5 MG tablet Take 1 tablet (5 mg total) by mouth daily.  . Ascorbic Acid (VITAMIN C) 1000 MG tablet Take 2,000 mg by mouth daily.  Marland Kitchen aspirin 81 MG tablet Take 81 mg by mouth daily.  . carvedilol (COREG) 25 MG tablet TAKE 1 TABLET BY MOUTH 2  TIMES DAILY WITH A MEAL.  . cetirizine (ZYRTEC ALLERGY) 10 MG tablet Take 1 tablet (10 mg total) by mouth daily.  . Coenzyme Q10 (CO Q 10 PO) Take by mouth.  . fluticasone (FLONASE) 50 MCG/ACT nasal spray Place 2 sprays into both nostrils daily.  Marland Kitchen glipiZIDE (GLUCOTROL) 5 MG tablet Take 1 tablet (5 mg total) by mouth daily with breakfast.  . lisinopril (ZESTRIL) 40 MG tablet Take 1 tablet (40 mg total) by mouth daily.  . metFORMIN (GLUCOPHAGE) 500 MG tablet TAKE 1 TABLET BY MOUTH  TWICE DAILY WITH A MEAL  . rosuvastatin (CRESTOR) 20 MG tablet Take 1 tablet (20 mg total) by mouth daily.    No current facility-administered medications for this visit. (Other)      REVIEW OF SYSTEMS:    ALLERGIES No Known Allergies  PAST MEDICAL HISTORY Past Medical History:  Diagnosis Date  . Allergy   . Asthma   . Coronary artery disease    s/p lateral MI 4 /11, s/p Promus DES OM2  . Diabetes mellitus without complication ([place])   . Heart murmur   . Hyperlipidemia   . Hypertension   . Myocardial infarct [facility]) 2011   Past Surgical History:  Procedure Laterality Date  . ADENOIDECTOMY    . CORONARY ARTERY BYPASS GRAFT  2011  . CORONARY STENT PLACEMENT    . TONSILLECTOMY    . TYMPANIC MEMBRANE REPAIR Right 2004  . VASECTOMY      FAMILY HISTORY Family History  Problem Relation Age of Onset  . Heart disease Mother   . Hypertension Mother   . Heart disease Father   . Hypertension Father   . Heart disease Brother   . Heart disease Sister   . Colon cancer Maternal Grandfather 70  . Colon polyps Neg Hx   . Esophageal cancer Neg Hx   . Stomach cancer Neg Hx   .  Rectal cancer Neg Hx     SOCIAL HISTORY Social History   Tobacco Use  . Smoking status: Former Smoker    Packs/day: 0.50    Years: 5.00    Pack years: 2.50    Types: Cigarettes    Quit date: 10/14/1974    Years since quitting: 45.6  . Smokeless tobacco: Never Used  Vaping Use  . Vaping Use: Never used  Substance Use Topics  . Alcohol use: Yes    Alcohol/week: 5.0 standard drinks    Types: 5 Standard drinks or equivalent per week  . Drug use: No         OPHTHALMIC EXAM: Base Eye Exam    Visual Acuity (ETDRS)      Right Left   Dist cc 20/20 20/25 -2   Correction: Glasses       Tonometry (Tonopen, 9:26 AM)      Right Left   Pressure 11 14       Pupils      Pupils Dark Light Shape React APD   Right PERRL 3 2 Round Brisk None   Left PERRL 3 2 Round Brisk None       Visual Fields (Counting fingers)      Left Right    Full Full       Extraocular Movement      Right Left    Full  Full       Neuro/Psych    Oriented x3: Yes   Mood/Affect: Normal       Dilation    Both eyes: 1.0% Mydriacyl, 2.5% Phenylephrine @ 9:26 AM        Slit Lamp and Fundus Exam    External Exam      Right Left   External Normal Normal       Slit Lamp Exam      Right Left   Lids/Lashes Normal Normal   Conjunctiva/Sclera White and quiet White and quiet   Cornea Clear Clear   Anterior Chamber Deep and quiet Deep and quiet   Iris Round and reactive Round and reactive   Lens 2+ Nuclear sclerosis 2+ Nuclear sclerosis   Anterior Vitreous Normal Normal       Fundus Exam      Right Left   Posterior Vitreous Normal Normal   Disc Normal Normal   C/D Ratio 0.35 0.45   Macula no hemorrhage, Microaneurysms no hemorrhage, Microaneurysms, circinate ring inferotemporal, smaller post focal laser   Vessels NPDR-Severe NPDR-Severe   Periphery Normal Normal          IMAGING AND PROCEDURES  Imaging and Procedures for 05/15/20  OCT, Retina - OU - Both Eyes       Right Eye Quality was good. Scan locations included subfoveal. Central Foveal Thickness: 271. Progression has improved. Findings include vitreomacular adhesion .   Left Eye Quality was good. Scan locations included subfoveal. Central Foveal Thickness: 270. Progression has been stable.   Notes OS , region temporally, smaller intraretinal fluid, CSME post focal, confirmed by OCT.   OD, no CSME                 ASSESSMENT/PLAN:  Severe nonproliferative diabetic retinopathy of left eye, with macular edema, associated with type 2 diabetes mellitus ([place]) Improved CSME temporal to the fovea confirmed by clinical examination and OCT today  Severe nonproliferative diabetic retinopathy of right eye without macular edema associated with type 2 diabetes mellitus (HCC) Stable OD, with good blood sugar control, follow-up  6 months      ICD-10-CM   1. Severe nonproliferative diabetic retinopathy of left eye, with macular  edema, associated with type 2 diabetes mellitus (HCC)  Q98.2641 OCT, Retina - OU - Both Eyes  2. Severe nonproliferative diabetic retinopathy of right eye without macular edema associated with type 2 diabetes mellitus (HCC)  E11.3491 OCT, Retina - OU - Both Eyes    1.  CSME OS resolved status post focal laser, will observe  2.  The progressive nature of  severe nonproliferative diabetic retinopathy reviewed with the patient yet with good blood sugar control will observe  3.  Ophthalmic Meds Ordered this visit:  No orders of the defined types were placed in this encounter.      Return in about 6 months (around 11/12/2020) for DILATE OU, OCT.  There are no Patient Instructions on file for this visit.   Explained the diagnoses, plan, and follow up with the patient and they expressed understanding.  Patient expressed understanding of the importance of proper follow up care.   Clent Demark Kaziyah Parkison M.D. Diseases & Surgery of the Retina and Vitreous [facility]ina [facility] Diabetic [place] 05/15/20     Abbreviations: M myopia (nearsighted); A astigmatism; H hyperopia (farsighted); P presbyopia; Mrx spectacle prescription;  CTL contact lenses; OD right eye; OS left eye; OU both eyes  XT exotropia; ET esotropia; PEK punctate epithelial keratitis; PEE punctate epithelial erosions; DES dry eye syndrome; MGD meibomian gland dysfunction; ATs artificial tears; PFAT's preservative free artificial tears; [place] nuclear sclerotic cataract; PSC posterior subcapsular cataract; ERM epi-retinal membrane; PVD posterior vitreous detachment; RD retinal detachment; DM diabetes mellitus; DR diabetic retinopathy; NPDR non-proliferative diabetic retinopathy; PDR proliferative diabetic retinopathy; CSME clinically significant macular edema; DME diabetic macular edema; dbh dot blot hemorrhages; CWS cotton wool spot; POAG primary open angle glaucoma; C/D cup-to-disc ratio; HVF humphrey visual field; GVF goldmann visual field; OCT  optical coherence tomography; IOP intraocular pressure; BRVO Branch retinal vein occlusion; CRVO central retinal vein occlusion; CRAO central retinal artery occlusion; BRAO branch retinal artery occlusion; RT retinal tear; SB scleral buckle; PPV pars plana vitrectomy; VH Vitreous hemorrhage; PRP panretinal laser photocoagulation; IVK intravitreal kenalog; VMT vitreomacular traction; MH Macular hole;  NVD neovascularization of the disc; NVE neovascularization elsewhere; AREDS age related eye disease study; ARMD age related macular degeneration; POAG primary open angle glaucoma; EBMD epithelial/anterior basement membrane dystrophy; ACIOL anterior chamber intraocular lens; IOL intraocular lens; PCIOL posterior chamber intraocular lens; Phaco/IOL phacoemulsification with intraocular lens placement; [place] photorefractive keratectomy; LASIK laser assisted in situ keratomileusis; HTN hypertension; DM diabetes mellitus; COPD chronic obstructive pulmonary disease

## 2020-05-15 NOTE — Assessment & Plan Note (Signed)
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

## 2020-05-15 NOTE — Assessment & Plan Note (Signed)
Stable OD, with good blood sugar control, follow-up 6 months

## 2020-05-20 DIAGNOSIS — E119 Type 2 diabetes mellitus without complications: Secondary | ICD-10-CM | POA: Diagnosis not present

## 2020-07-27 DIAGNOSIS — L819 Disorder of pigmentation, unspecified: Secondary | ICD-10-CM | POA: Diagnosis not present

## 2020-07-27 DIAGNOSIS — L905 Scar conditions and fibrosis of skin: Secondary | ICD-10-CM | POA: Diagnosis not present

## 2020-07-27 DIAGNOSIS — L720 Epidermal cyst: Secondary | ICD-10-CM | POA: Diagnosis not present

## 2020-07-27 DIAGNOSIS — D229 Melanocytic nevi, unspecified: Secondary | ICD-10-CM | POA: Diagnosis not present

## 2020-07-27 DIAGNOSIS — L814 Other melanin hyperpigmentation: Secondary | ICD-10-CM | POA: Diagnosis not present

## 2020-07-27 DIAGNOSIS — L821 Other seborrheic keratosis: Secondary | ICD-10-CM | POA: Diagnosis not present

## 2020-07-27 DIAGNOSIS — Z85828 Personal history of other malignant neoplasm of skin: Secondary | ICD-10-CM | POA: Diagnosis not present

## 2020-07-28 ENCOUNTER — Other Ambulatory Visit: Payer: Self-pay | Admitting: Emergency Medicine

## 2020-07-28 DIAGNOSIS — E1165 Type 2 diabetes mellitus with hyperglycemia: Secondary | ICD-10-CM

## 2020-07-29 NOTE — Telephone Encounter (Signed)
Requested medication (s) are due for refill today: -  Requested medication (s) are on the active medication list: expired 04/26/20  Last refill:  08/17/19  Future visit scheduled: yes  Notes to clinic:  overdue lab work, prescription expired   Requested Prescriptions  Pending Prescriptions Disp Refills   glipiZIDE ([place]) 5 MG tablet [Pharmacy Med Name: glipiZIDE 5 MG Oral Tablet] 90 tablet 3    Sig: TAKE 1 [place]      Endocrinology:  Diabetes - Sulfonylureas Passed - 07/28/2020 10:37 PM      Passed - HBA1C is between 0 and 7.9 and within 180 days    Hgb A1c MFr Bld  Date Value Ref Range Status  02/15/2020 6.8 (H) 4.8 - 5.6 % Final    Comment:             Prediabetes: 5.7 - 6.4          Diabetes: >6.4          Glycemic control for adults with diabetes: <7.0           Passed - Valid encounter within last 6 months    Recent Outpatient Visits           3 months ago Multiple contusions   [facility] at [place], [place], [place]   5 months ago Hypertension associated with diabetes [facility])   [facility] at [facility], [place], [place]   11 months ago Hypertension associated with diabetes [facility])   Primary Care at [facility], Ines Bloomer, MD   11 months ago Medicare annual wellness visit, subsequent   [facility] at [facility], Ranell Patrick, MD   1 year ago Type 2 diabetes mellitus with hyperglycemia, without long-term current use of insulin [facility])   Primary Care at [facility], Ines Bloomer, MD       Future Appointments             In 2 weeks Sag[place], Ines Bloomer, MD Primary Care at [place], [facility]

## 2020-08-17 ENCOUNTER — Other Ambulatory Visit: Payer: Self-pay

## 2020-08-17 ENCOUNTER — Encounter: Payer: Self-pay | Admitting: Emergency Medicine

## 2020-08-17 ENCOUNTER — Ambulatory Visit (INDEPENDENT_AMBULATORY_CARE_PROVIDER_SITE_OTHER): Payer: Medicare Other | Admitting: Emergency Medicine

## 2020-08-17 VITALS — BP 136/80 | HR 65 | Temp 97.8°F | Resp 16 | Ht 69.0 in | Wt 206.0 lb

## 2020-08-17 DIAGNOSIS — I152 Hypertension secondary to endocrine disorders: Secondary | ICD-10-CM

## 2020-08-17 DIAGNOSIS — I2510 Atherosclerotic heart disease of native coronary artery without angina pectoris: Secondary | ICD-10-CM

## 2020-08-17 DIAGNOSIS — E1169 Type 2 diabetes mellitus with other specified complication: Secondary | ICD-10-CM

## 2020-08-17 DIAGNOSIS — E785 Hyperlipidemia, unspecified: Secondary | ICD-10-CM | POA: Diagnosis not present

## 2020-08-17 DIAGNOSIS — E1159 Type 2 diabetes mellitus with other circulatory complications: Secondary | ICD-10-CM

## 2020-08-17 DIAGNOSIS — I255 Ischemic cardiomyopathy: Secondary | ICD-10-CM

## 2020-08-17 LAB — COMPREHENSIVE METABOLIC PANEL
ALT: 23 IU/L (ref 0–44)
AST: 22 IU/L (ref 0–40)
Albumin/Globulin Ratio: 2.3 — ABNORMAL HIGH (ref 1.2–2.2)
Albumin: 4.6 g/dL (ref 3.8–4.8)
Alkaline Phosphatase: 58 IU/L (ref 44–121)
BUN/Creatinine Ratio: 14 (ref 10–24)
BUN: 13 mg/dL (ref 8–27)
Bilirubin Total: 0.3 mg/dL (ref 0.0–1.2)
CO2: 18 mmol/L — ABNORMAL LOW (ref 20–29)
Calcium: 9.3 mg/dL (ref 8.6–10.2)
Chloride: 107 mmol/L — ABNORMAL HIGH (ref 96–106)
Creatinine, Ser: 0.9 mg/dL (ref 0.76–1.27)
GFR calc Af Amer: 102 mL/min/{1.73_m2} (ref >59)
GFR calc non Af Amer: 88 mL/min/{1.73_m2} (ref >59)
Globulin, Total: 2 g/dL (ref 1.5–4.5)
Glucose: 74 mg/dL (ref 65–99)
Potassium: 4.1 mmol/L (ref 3.5–5.2)
Sodium: 145 mmol/L — ABNORMAL HIGH (ref 134–144)
Total Protein: 6.6 g/dL (ref 6.0–8.5)

## 2020-08-17 LAB — LIPID PANEL
Chol/HDL Ratio: 2.4 ratio (ref 0.0–5.0)
Cholesterol, Total: 137 mg/dL (ref 100–199)
HDL: 56 mg/dL (ref >39)
LDL Chol Calc (NIH): 52 mg/dL (ref 0–99)
Triglycerides: 175 mg/dL — ABNORMAL HIGH (ref 0–149)
VLDL Cholesterol Cal: 29 mg/dL (ref 5–40)

## 2020-08-17 LAB — POCT GLYCOSYLATED HEMOGLOBIN (HGB A1C): Hemoglobin A1C: 7 % — AB (ref 4.0–5.6)

## 2020-08-17 LAB — GLUCOSE, POCT (MANUAL RESULT ENTRY): POC Glucose: 74 mg/dl (ref 70–99)

## 2020-08-17 NOTE — Assessment & Plan Note (Signed)
Systolic blood pressure slightly elevated in the office. Normal blood pressure readings at home. Continue present medications. No changes. Hemoglobin A1c at 7.0, slightly higher than before. Continue Metformin twice a day. Diet and nutrition discussed. Continue rosuvastatin 20 mg daily. Follow-up in 6 months.

## 2020-08-17 NOTE — Patient Instructions (Addendum)
   If you have lab work done today you will be contacted with your lab results within the next 2 weeks.  If you have not heard from us then please contact us. The fastest way to get your results is to register for My Chart.   IF you received an x-ray today, you will receive an invoice from [facility]. Please contact [facility] at [phone] with questions or concerns regarding your invoice.   IF you received labwork today, you will receive an invoice from [facility]. Please contact [facility] at [phone] with questions or concerns regarding your invoice.   Our billing staff will not be able to assist you with questions regarding bills from these companies.  You will be contacted with the lab results as soon as they are available. The fastest way to get your results is to activate your My Chart account. Instructions are located on the last page of this paperwork. If you have not heard from us regarding the results in 2 weeks, please contact this office.     Diabetes Mellitus and Nutrition, Adult When you have diabetes, or diabetes mellitus, it is very important to have healthy eating habits because your blood sugar (glucose) levels are greatly affected by what you eat and drink. Eating healthy foods in the right amounts, at about the same times every day, can help you:  Control your blood glucose.  Lower your risk of heart disease.  Improve your blood pressure.  Reach or maintain a healthy weight. What can affect my meal plan? Every person with diabetes is different, and each person has different needs for a meal plan. Your health care provider may recommend that you work with a dietitian to make a meal plan that is best for you. Your meal plan may vary depending on factors such as:  The calories you need.  The medicines you take.  Your weight.  Your blood glucose, blood pressure, and cholesterol levels.  Your activity level.  Other health conditions you  have, such as heart or kidney disease. How do carbohydrates affect me? Carbohydrates, also called carbs, affect your blood glucose level more than any other type of food. Eating carbs naturally raises the amount of glucose in your blood. Carb counting is a method for keeping track of how many carbs you eat. Counting carbs is important to keep your blood glucose at a healthy level, especially if you use insulin or take certain oral diabetes medicines. It is important to know how many carbs you can safely have in each meal. This is different for every person. Your dietitian can help you calculate how many carbs you should have at each meal and for each snack. How does alcohol affect me? Alcohol can cause a sudden decrease in blood glucose (hypoglycemia), especially if you use insulin or take certain oral diabetes medicines. Hypoglycemia can be a life-threatening condition. Symptoms of hypoglycemia, such as sleepiness, dizziness, and confusion, are similar to symptoms of having too much alcohol.  Do not drink alcohol if: ? Your health care provider tells you not to drink. ? You are pregnant, may be pregnant, or are planning to become pregnant.  If you drink alcohol: ? Do not drink on an empty stomach. ? Limit how much you use to:  0-1 drink a day for women.  0-2 drinks a day for men. ? Be aware of how much alcohol is in your drink. In the [place] one drink equals one 12 oz bottle of beer (355 mL),   one 5 oz glass of wine (148 mL), or one 1 oz glass of hard liquor (44 mL). ? Keep yourself hydrated with water, diet soda, or unsweetened iced tea.  Keep in mind that regular soda, juice, and other mixers may contain a lot of sugar and must be counted as carbs. What are tips for following this plan? Reading food labels  Start by checking the serving size on the "Nutrition Facts" label of packaged foods and drinks. The amount of calories, carbs, fats, and other nutrients listed on the label is based on  one serving of the item. Many items contain more than one serving per package.  Check the total grams (g) of carbs in one serving. You can calculate the number of servings of carbs in one serving by dividing the total carbs by 15. For example, if a food has 30 g of total carbs per serving, it would be equal to 2 servings of carbs.  Check the number of grams (g) of saturated fats and trans fats in one serving. Choose foods that have a low amount or none of these fats.  Check the number of milligrams (mg) of salt (sodium) in one serving. Most people should limit total sodium intake to less than 2,300 mg per day.  Always check the nutrition information of foods labeled as "low-fat" or "nonfat." These foods may be higher in added sugar or refined carbs and should be avoided.  Talk to your dietitian to identify your daily goals for nutrients listed on the label. Shopping  Avoid buying canned, pre-made, or processed foods. These foods tend to be high in fat, sodium, and added sugar.  Shop around the outside edge of the grocery store. This is where you will most often find fresh fruits and vegetables, bulk grains, fresh meats, and fresh dairy. Cooking  Use low-heat cooking methods, such as baking, instead of high-heat cooking methods like deep frying.  Cook using healthy oils, such as olive, canola, or sunflower oil.  Avoid cooking with butter, cream, or high-fat meats. Meal planning  Eat meals and snacks regularly, preferably at the same times every day. Avoid going long periods of time without eating.  Eat foods that are high in fiber, such as fresh fruits, vegetables, beans, and whole grains. Talk with your dietitian about how many servings of carbs you can eat at each meal.  Eat 4-6 oz (112-168 g) of lean protein each day, such as lean meat, chicken, fish, eggs, or tofu. One ounce (oz) of lean protein is equal to: ? 1 oz (28 g) of meat, chicken, or fish. ? 1 egg. ?  cup (62 g) of  tofu.  Eat some foods each day that contain healthy fats, such as avocado, nuts, seeds, and fish.   What foods should I eat? Fruits Berries. Apples. Oranges. Peaches. Apricots. Plums. Grapes. Mango. Papaya. Pomegranate. Kiwi. Cherries. Vegetables Lettuce. Spinach. Leafy greens, including kale, chard, collard greens, and mustard greens. Beets. Cauliflower. Cabbage. Broccoli. Carrots. Green beans. Tomatoes. Peppers. Onions. Cucumbers. Brussels sprouts. Grains Whole grains, such as whole-wheat or whole-grain bread, crackers, tortillas, cereal, and pasta. Unsweetened oatmeal. Quinoa. Brown or wild rice. Meats and other proteins Seafood. Poultry without skin. Lean cuts of poultry and beef. Tofu. Nuts. Seeds. Dairy Low-fat or fat-free dairy products such as milk, yogurt, and cheese. The items listed above may not be a complete list of foods and beverages you can eat. Contact a dietitian for more information. What foods should I avoid? Fruits Fruits canned   with syrup. Vegetables Canned vegetables. Frozen vegetables with butter or cream sauce. Grains Refined white flour and flour products such as bread, pasta, snack foods, and cereals. Avoid all processed foods. Meats and other proteins Fatty cuts of meat. Poultry with skin. Breaded or fried meats. Processed meat. Avoid saturated fats. Dairy Full-fat yogurt, cheese, or milk. Beverages Sweetened drinks, such as soda or iced tea. The items listed above may not be a complete list of foods and beverages you should avoid. Contact a dietitian for more information. Questions to ask a health care provider  Do I need to meet with a diabetes educator?  Do I need to meet with a dietitian?  What number can I call if I have questions?  When are the best times to check my blood glucose? Where to find more information:  American Diabetes Association: diabetes.org  Academy of Nutrition and Dietetics: www.eatright.org  National Institute of  Diabetes and Digestive and Kidney Diseases: www.niddk.nih.gov  Association of Diabetes Care and Education Specialists: www.diabeteseducator.org Summary  It is important to have healthy eating habits because your blood sugar (glucose) levels are greatly affected by what you eat and drink.  A healthy meal plan will help you control your blood glucose and maintain a healthy lifestyle.  Your health care provider may recommend that you work with a dietitian to make a meal plan that is best for you.  Keep in mind that carbohydrates (carbs) and alcohol have immediate effects on your blood glucose levels. It is important to count carbs and to use alcohol carefully. This information is not intended to replace advice given to you by your health care provider. Make sure you discuss any questions you have with your health care provider. Document Revised: 05/25/2019 Document Reviewed: 05/25/2019 Elsevier Patient Education  2021 [employer].  

## 2020-08-17 NOTE — Progress Notes (Signed)
Scott Dodson 70 y.o.   Chief Complaint  Patient presents with  . Diabetes  . Hypertension    Follow up 6 month    HISTORY OF PRESENT ILLNESS: This is a 70 y.o. male with multiple chronic medical problems here for follow-up. #1 diabetes: On Metformin 500 mg twice a day. #2 hypertension: On amlodipine 5 mg and lisinopril 40 mg daily. #3 dyslipidemia: On rosuvastatin 20 mg daily. #4 ischemic cardiomyopathy: On baby aspirin daily and carvedilol 25 mg twice a day.  Denies anginal episodes or significant dyspnea on exertion. Fully vaccinated against COVID with a booster. Has no complaints or medical concerns today. Lab Results  Component Value Date   HGBA1C 6.8 (H) 02/15/2020   BP Readings from Last 3 Encounters:  04/26/20 (!) 153/81  02/15/20 (!) 150/77  08/17/19 (!) 153/80    HPI   Prior to Admission medications   Medication Sig Start Date End Date Taking? Authorizing Provider  ALBUTEROL IN Inhale into the lungs.    [provider]  amLODipine (NORVASC) 5 MG tablet Take 1 tablet (5 mg total) by mouth daily. 02/15/20   Horald Pollen, MD  Ascorbic Acid (VITAMIN C) 1000 MG tablet Take 2,000 mg by mouth daily.    [provider]  aspirin 81 MG tablet Take 81 mg by mouth daily.    [provider]  carvedilol (COREG) 25 MG tablet TAKE 1 TABLET BY MOUTH 2  TIMES DAILY WITH A MEAL. 02/15/20   Horald Pollen, MD  cetirizine (ZYRTEC ALLERGY) 10 MG tablet Take 1 tablet (10 mg total) by mouth daily. 10/13/17   Jaynee Eagles, PA-C  Coenzyme Q10 (CO Q 10 PO) Take by mouth.    [provider]  fluticasone (FLONASE) 50 MCG/ACT nasal spray Place 2 sprays into both nostrils daily. 10/13/17   Jaynee Eagles, PA-C  glipiZIDE (GLUCOTROL) 5 MG tablet TAKE 1 TABLET BY MOUTH  DAILY WITH BREAKFAST 07/31/20   Horald Pollen, MD  lisinopril (ZESTRIL) 40 MG tablet Take 1 tablet (40 mg total) by mouth daily. 02/15/20   Horald Pollen, MD  metFORMIN  (GLUCOPHAGE) 500 MG tablet TAKE 1 TABLET BY MOUTH  TWICE DAILY WITH A MEAL 02/15/20   Horald Pollen, MD  rosuvastatin (CRESTOR) 20 MG tablet Take 1 tablet (20 mg total) by mouth daily. 02/15/20   Horald Pollen, MD    No Known Allergies  Patient Active Problem List   Diagnosis Date Noted  . Vitreomacular adhesion of left eye 12/13/2019  . Severe nonproliferative diabetic retinopathy of right eye without macular edema associated with type 2 diabetes mellitus ([place]) 12/13/2019  . Severe nonproliferative diabetic retinopathy of left eye, with macular edema, associated with type 2 diabetes mellitus ([place]) 12/13/2019  . Vitreomacular adhesion of right eye 12/13/2019  . Impingement syndrome of right shoulder 02/16/2019  . History of diabetes mellitus 02/09/2018  . CARDIOMYOPATHY, ISCHEMIC 05/18/2010  . Coronary atherosclerosis 10/11/2009  . Dyslipidemia associated with type 2 diabetes mellitus ([place]) 10/09/2009  . HYPERLIPIDEMIA 10/09/2009  . Hypertension associated with diabetes ([place]) 10/09/2009  . MYOCARDIAL INFARCTION, HX OF 10/09/2009    Past Medical History:  Diagnosis Date  . Allergy   . Asthma   . Coronary artery disease    s/p lateral MI 4 /11, s/p Promus DES OM2  . Diabetes mellitus without complication ([place])   . Heart murmur   . Hyperlipidemia   . Hypertension   . Myocardial infarct [facility]) 2011  Past Surgical History:  Procedure Laterality Date  . ADENOIDECTOMY    . CORONARY ARTERY BYPASS GRAFT  2011  . CORONARY STENT PLACEMENT    . TONSILLECTOMY    . TYMPANIC MEMBRANE REPAIR Right 2004  . VASECTOMY      Social History   Socioeconomic History  . Marital status: Married    Spouse name: Not on file  . Number of children: 3  . Years of education: Not on file  . Highest education level: Not on file  Occupational History  . Occupation: Charity fundraiser  . Occupation: retired  Tobacco Use  . Smoking status: Former Smoker    Packs/day: 0.50     Years: 5.00    Pack years: 2.50    Types: Cigarettes    Quit date: 10/14/1974    Years since quitting: 45.8  . Smokeless tobacco: Never Used  Vaping Use  . Vaping Use: Never used  Substance and Sexual Activity  . Alcohol use: Yes    Alcohol/week: 5.0 standard drinks    Types: 5 Standard drinks or equivalent per week  . Drug use: No  . Sexual activity: Yes  Other Topics Concern  . Not on file  Social History Narrative   Occupation- Photographer    Former -tobacco -stopped 1979   Married ,3 children     Alcohol use - yes , social    Drug use - no   Regular exercise- yes    Social Determinants of Radio broadcast assistant Strain: Not on file  Food Insecurity: Not on file  Transportation Needs: Not on file  Physical Activity: Not on file  Stress: Not on file  Social Connections: Not on file  Intimate Partner Violence: Not on file    Family History  Problem Relation Age of Onset  . Heart disease Mother   . Hypertension Mother   . Heart disease Father   . Hypertension Father   . Heart disease Brother   . Heart disease Sister   . Colon cancer Maternal Grandfather 75  . Colon polyps Neg Hx   . Esophageal cancer Neg Hx   . Stomach cancer Neg Hx   . Rectal cancer Neg Hx      Review of Systems  Constitutional: Negative.  Negative for chills and fever.  HENT: Negative.  Negative for congestion and sore throat.   Respiratory: Negative.  Negative for cough and shortness of breath.   Cardiovascular: Negative.  Negative for chest pain and palpitations.  Gastrointestinal: Negative.  Negative for abdominal pain, diarrhea, nausea and vomiting.  Genitourinary: Negative.  Negative for hematuria.  Skin: Negative.  Negative for rash.  Neurological: Negative.  Negative for dizziness and headaches.  All other systems reviewed and are negative.  Today's Vitals   08/17/20 0804  BP: (!) 151/78  Pulse: 65  Resp: 16  Temp: 97.8 F (36.6 C)  TempSrc: Temporal   SpO2: 98%  Weight: 206 lb (93.4 kg)  Height: 5\' 9"  (1.753 m)   Body mass index is 30.42 kg/m. Wt Readings from Last 3 Encounters:  08/17/20 206 lb (93.4 kg)  04/26/20 206 lb 6.4 oz (93.6 kg)  02/15/20 201 lb (91.2 kg)     Physical Exam Vitals reviewed.  HENT:     Head: Normocephalic.  Eyes:     Extraocular Movements: Extraocular movements intact.     Pupils: Pupils are equal, round, and reactive to light.  Cardiovascular:     Rate and Rhythm:  Normal rate and regular rhythm.     Pulses: Normal pulses.     Heart sounds: Normal heart sounds.  Pulmonary:     Effort: Pulmonary effort is normal.     Breath sounds: Normal breath sounds.  Musculoskeletal:        General: Normal range of motion.     Cervical back: Normal range of motion and neck supple.  Skin:    Capillary Refill: Capillary refill takes less than 2 seconds.  Neurological:     General: No focal deficit present.     Mental Status: He is alert and oriented to person, place, and time.  Psychiatric:        Mood and Affect: Mood normal.        Behavior: Behavior normal.    Results for orders placed or performed in visit on 08/17/20 (from the past 24 hour(s))  POCT glucose (manual entry)     Status: None   Collection Time: 08/17/20  8:12 AM  Result Value Ref Range   POC Glucose 74 70 - 99 mg/dl  POCT glycosylated hemoglobin (Hb A1C)     Status: Abnormal   Collection Time: 08/17/20  8:17 AM  Result Value Ref Range   Hemoglobin A1C 7.0 (A) 4.0 - 5.6 %   HbA1c POC (<> result, manual entry)     HbA1c, POC (prediabetic range)     HbA1c, POC (controlled diabetic range)       ASSESSMENT & PLAN: Hypertension associated with diabetes (HCC) Systolic blood pressure slightly elevated in the office. Normal blood pressure readings at home. Continue present medications. No changes. Hemoglobin A1c at 7.0, slightly higher than before. Continue Metformin twice a day. Diet and nutrition discussed. Continue rosuvastatin 20  mg daily. Follow-up in 6 months.   Clinically stable. No medical concerns identified during this visit. Stable chronic medical problems. Continue present medications. No changes. Follow-up in 6 months. Scott Dodson was seen today for diabetes and hypertension.  Diagnoses and all orders for this visit:  Hypertension associated with diabetes ([place]) -     POCT glucose (manual entry) -     POCT glycosylated hemoglobin (Hb A1C) -     Comprehensive metabolic panel  Dyslipidemia associated with type 2 diabetes mellitus (HCC) -     Lipid panel  Cardiomyopathy, ischemic  Atherosclerosis of coronary artery of native heart without angina pectoris, unspecified vessel or lesion type    Patient Instructions       If you have lab work done today you will be contacted with your lab results within the next 2 weeks.  If you have not heard from [place] then please contact us. The fastest way to get your results is to register for My Chart.   IF you received an x-ray today, you will receive an invoice from [facility]. Please contact [facility] at [phone] with questions or concerns regarding your invoice.   IF you received labwork today, you will receive an invoice from [facility]. Please contact [facility] at [phone] with questions or concerns regarding your invoice.   Our billing staff will not be able to assist you with questions regarding bills from these companies.  You will be contacted with the lab results as soon as they are available. The fastest way to get your results is to activate your My Chart account. Instructions are located on the last page of this paperwork. If you have not heard from [place] regarding the results in 2 weeks, please contact this office.  Diabetes Mellitus and Nutrition, Adult When you have diabetes, or diabetes mellitus, it is very important to have healthy eating habits because your blood sugar (glucose) levels are greatly affected by what you  eat and drink. Eating healthy foods in the right amounts, at about the same times every day, can help you:  Control your blood glucose.  Lower your risk of heart disease.  Improve your blood pressure.  Reach or maintain a healthy weight. What can affect my meal plan? Every person with diabetes is different, and each person has different needs for a meal plan. Your health care provider may recommend that you work with a dietitian to make a meal plan that is best for you. Your meal plan may vary depending on factors such as:  The calories you need.  The medicines you take.  Your weight.  Your blood glucose, blood pressure, and cholesterol levels.  Your activity level.  Other health conditions you have, such as heart or kidney disease. How do carbohydrates affect me? Carbohydrates, also called carbs, affect your blood glucose level more than any other type of food. Eating carbs naturally raises the amount of glucose in your blood. Carb counting is a method for keeping track of how many carbs you eat. Counting carbs is important to keep your blood glucose at a healthy level, especially if you use insulin or take certain oral diabetes medicines. It is important to know how many carbs you can safely have in each meal. This is different for every person. Your dietitian can help you calculate how many carbs you should have at each meal and for each snack. How does alcohol affect me? Alcohol can cause a sudden decrease in blood glucose (hypoglycemia), especially if you use insulin or take certain oral diabetes medicines. Hypoglycemia can be a life-threatening condition. Symptoms of hypoglycemia, such as sleepiness, dizziness, and confusion, are similar to symptoms of having too much alcohol.  Do not drink alcohol if: ? Your health care provider tells you not to drink. ? You are pregnant, may be pregnant, or are planning to become pregnant.  If you drink alcohol: ? Do not drink on an empty  stomach. ? Limit how much you use to:  0-1 drink a day for women.  0-2 drinks a day for men. ? Be aware of how much alcohol is in your drink. In the [place] one drink equals one 12 oz bottle of beer (355 mL), one 5 oz glass of wine (148 mL), or one 1 oz glass of hard liquor (44 mL). ? Keep yourself hydrated with water, diet soda, or unsweetened iced tea.  Keep in mind that regular soda, juice, and other mixers may contain a lot of sugar and must be counted as carbs. What are tips for following this plan? Reading food labels  Start by checking the serving size on the "Nutrition Facts" label of packaged foods and drinks. The amount of calories, carbs, fats, and other nutrients listed on the label is based on one serving of the item. Many items contain more than one serving per package.  Check the total grams (g) of carbs in one serving. You can calculate the number of servings of carbs in one serving by dividing the total carbs by 15. For example, if a food has 30 g of total carbs per serving, it would be equal to 2 servings of carbs.  Check the number of grams (g) of saturated fats and trans fats in one serving. Choose foods that  have a low amount or none of these fats.  Check the number of milligrams (mg) of salt (sodium) in one serving. Most people should limit total sodium intake to less than 2,300 mg per day.  Always check the nutrition information of foods labeled as "low-fat" or "nonfat." These foods may be higher in added sugar or refined carbs and should be avoided.  Talk to your dietitian to identify your daily goals for nutrients listed on the label. Shopping  Avoid buying canned, pre-made, or processed foods. These foods tend to be high in fat, sodium, and added sugar.  Shop around the outside edge of the grocery store. This is where you will most often find fresh fruits and vegetables, bulk grains, fresh meats, and fresh dairy. Cooking  Use low-heat cooking methods, such as  baking, instead of high-heat cooking methods like deep frying.  Cook using healthy oils, such as olive, canola, or sunflower oil.  Avoid cooking with butter, cream, or high-fat meats. Meal planning  Eat meals and snacks regularly, preferably at the same times every day. Avoid going long periods of time without eating.  Eat foods that are high in fiber, such as fresh fruits, vegetables, beans, and whole grains. Talk with your dietitian about how many servings of carbs you can eat at each meal.  Eat 4-6 oz (112-168 g) of lean protein each day, such as lean meat, chicken, fish, eggs, or tofu. One ounce (oz) of lean protein is equal to: ? 1 oz (28 g) of meat, chicken, or fish. ? 1 egg. ?  cup (62 g) of tofu.  Eat some foods each day that contain healthy fats, such as avocado, nuts, seeds, and fish.   What foods should I eat? Fruits Berries. Apples. Oranges. Peaches. Apricots. Plums. Grapes. Mango. Papaya. Pomegranate. Kiwi. Cherries. Vegetables Lettuce. Spinach. Leafy greens, including kale, chard, collard greens, and mustard greens. Beets. Cauliflower. Cabbage. Broccoli. Carrots. Green beans. Tomatoes. Peppers. Onions. Cucumbers. Brussels sprouts. Grains Whole grains, such as whole-wheat or whole-grain bread, crackers, tortillas, cereal, and pasta. Unsweetened oatmeal. Quinoa. Brown or wild rice. Meats and other proteins Seafood. Poultry without skin. Lean cuts of poultry and beef. Tofu. Nuts. Seeds. Dairy Low-fat or fat-free dairy products such as milk, yogurt, and cheese. The items listed above may not be a complete list of foods and beverages you can eat. Contact a dietitian for more information. What foods should I avoid? Fruits Fruits canned with syrup. Vegetables Canned vegetables. Frozen vegetables with butter or cream sauce. Grains Refined white flour and flour products such as bread, pasta, snack foods, and cereals. Avoid all processed foods. Meats and other proteins Fatty  cuts of meat. Poultry with skin. Breaded or fried meats. Processed meat. Avoid saturated fats. Dairy Full-fat yogurt, cheese, or milk. Beverages Sweetened drinks, such as soda or iced tea. The items listed above may not be a complete list of foods and beverages you should avoid. Contact a dietitian for more information. Questions to ask a health care provider  Do I need to meet with a diabetes educator?  Do I need to meet with a dietitian?  What number can I call if I have questions?  When are the best times to check my blood glucose? Where to find more information:  American Diabetes Association: diabetes.org  Academy of Nutrition and Dietetics: www.eatright.[employer] of Diabetes and Digestive and Kidney Diseases: [employer]  Association of Diabetes Care and Education Specialists: www.diabeteseducator.org Summary  It is important to have healthy  eating habits because your blood sugar (glucose) levels are greatly affected by what you eat and drink.  A healthy meal plan will help you control your blood glucose and maintain a healthy lifestyle.  Your health care provider may recommend that you work with a dietitian to make a meal plan that is best for you.  Keep in mind that carbohydrates (carbs) and alcohol have immediate effects on your blood glucose levels. It is important to count carbs and to use alcohol carefully. This information is not intended to replace advice given to you by your health care provider. Make sure you discuss any questions you have with your health care provider. Document Revised: 05/25/2019 Document Reviewed: 05/25/2019 Elsevier Patient Education  2021 Elsevier Inc.     Agustina Caroli, MD [facility]

## 2020-11-13 ENCOUNTER — Encounter (INDEPENDENT_AMBULATORY_CARE_PROVIDER_SITE_OTHER): Payer: Medicare Other | Admitting: Ophthalmology

## 2020-12-18 ENCOUNTER — Encounter (INDEPENDENT_AMBULATORY_CARE_PROVIDER_SITE_OTHER): Payer: Medicare Other | Admitting: Ophthalmology

## 2020-12-28 DIAGNOSIS — M9901 Segmental and somatic dysfunction of cervical region: Secondary | ICD-10-CM | POA: Diagnosis not present

## 2020-12-28 DIAGNOSIS — M9905 Segmental and somatic dysfunction of pelvic region: Secondary | ICD-10-CM | POA: Diagnosis not present

## 2020-12-28 DIAGNOSIS — M9904 Segmental and somatic dysfunction of sacral region: Secondary | ICD-10-CM | POA: Diagnosis not present

## 2020-12-28 DIAGNOSIS — M9903 Segmental and somatic dysfunction of lumbar region: Secondary | ICD-10-CM | POA: Diagnosis not present

## 2020-12-29 DIAGNOSIS — M9903 Segmental and somatic dysfunction of lumbar region: Secondary | ICD-10-CM | POA: Diagnosis not present

## 2020-12-29 DIAGNOSIS — M9904 Segmental and somatic dysfunction of sacral region: Secondary | ICD-10-CM | POA: Diagnosis not present

## 2020-12-29 DIAGNOSIS — M9905 Segmental and somatic dysfunction of pelvic region: Secondary | ICD-10-CM | POA: Diagnosis not present

## 2020-12-29 DIAGNOSIS — M9901 Segmental and somatic dysfunction of cervical region: Secondary | ICD-10-CM | POA: Diagnosis not present

## 2021-01-02 DIAGNOSIS — M9904 Segmental and somatic dysfunction of sacral region: Secondary | ICD-10-CM | POA: Diagnosis not present

## 2021-01-02 DIAGNOSIS — M9903 Segmental and somatic dysfunction of lumbar region: Secondary | ICD-10-CM | POA: Diagnosis not present

## 2021-01-02 DIAGNOSIS — M9901 Segmental and somatic dysfunction of cervical region: Secondary | ICD-10-CM | POA: Diagnosis not present

## 2021-01-02 DIAGNOSIS — M9905 Segmental and somatic dysfunction of pelvic region: Secondary | ICD-10-CM | POA: Diagnosis not present

## 2021-01-03 DIAGNOSIS — M9901 Segmental and somatic dysfunction of cervical region: Secondary | ICD-10-CM | POA: Diagnosis not present

## 2021-01-03 DIAGNOSIS — M9905 Segmental and somatic dysfunction of pelvic region: Secondary | ICD-10-CM | POA: Diagnosis not present

## 2021-01-03 DIAGNOSIS — M9903 Segmental and somatic dysfunction of lumbar region: Secondary | ICD-10-CM | POA: Diagnosis not present

## 2021-01-03 DIAGNOSIS — M9904 Segmental and somatic dysfunction of sacral region: Secondary | ICD-10-CM | POA: Diagnosis not present

## 2021-01-04 ENCOUNTER — Encounter (INDEPENDENT_AMBULATORY_CARE_PROVIDER_SITE_OTHER): Payer: Medicare Other | Admitting: Ophthalmology

## 2021-01-05 DIAGNOSIS — M9904 Segmental and somatic dysfunction of sacral region: Secondary | ICD-10-CM | POA: Diagnosis not present

## 2021-01-05 DIAGNOSIS — M9901 Segmental and somatic dysfunction of cervical region: Secondary | ICD-10-CM | POA: Diagnosis not present

## 2021-01-05 DIAGNOSIS — M9903 Segmental and somatic dysfunction of lumbar region: Secondary | ICD-10-CM | POA: Diagnosis not present

## 2021-01-05 DIAGNOSIS — M9905 Segmental and somatic dysfunction of pelvic region: Secondary | ICD-10-CM | POA: Diagnosis not present

## 2021-01-14 ENCOUNTER — Other Ambulatory Visit: Payer: Self-pay | Admitting: Emergency Medicine

## 2021-01-14 DIAGNOSIS — E1169 Type 2 diabetes mellitus with other specified complication: Secondary | ICD-10-CM

## 2021-01-14 DIAGNOSIS — E1165 Type 2 diabetes mellitus with hyperglycemia: Secondary | ICD-10-CM

## 2021-01-14 DIAGNOSIS — I10 Essential (primary) hypertension: Secondary | ICD-10-CM

## 2021-02-05 ENCOUNTER — Other Ambulatory Visit: Payer: Self-pay

## 2021-02-05 ENCOUNTER — Ambulatory Visit (INDEPENDENT_AMBULATORY_CARE_PROVIDER_SITE_OTHER): Payer: Medicare Other | Admitting: Ophthalmology

## 2021-02-05 ENCOUNTER — Encounter (INDEPENDENT_AMBULATORY_CARE_PROVIDER_SITE_OTHER): Payer: Self-pay | Admitting: Ophthalmology

## 2021-02-05 DIAGNOSIS — H2513 Age-related nuclear cataract, bilateral: Secondary | ICD-10-CM | POA: Insufficient documentation

## 2021-02-05 DIAGNOSIS — M9904 Segmental and somatic dysfunction of sacral region: Secondary | ICD-10-CM | POA: Diagnosis not present

## 2021-02-05 DIAGNOSIS — E113491 Type 2 diabetes mellitus with severe nonproliferative diabetic retinopathy without macular edema, right eye: Secondary | ICD-10-CM

## 2021-02-05 DIAGNOSIS — M9901 Segmental and somatic dysfunction of cervical region: Secondary | ICD-10-CM | POA: Diagnosis not present

## 2021-02-05 DIAGNOSIS — M9903 Segmental and somatic dysfunction of lumbar region: Secondary | ICD-10-CM | POA: Diagnosis not present

## 2021-02-05 DIAGNOSIS — E113412 Type 2 diabetes mellitus with severe nonproliferative diabetic retinopathy with macular edema, left eye: Secondary | ICD-10-CM | POA: Diagnosis not present

## 2021-02-05 DIAGNOSIS — H43822 Vitreomacular adhesion, left eye: Secondary | ICD-10-CM | POA: Diagnosis not present

## 2021-02-05 DIAGNOSIS — H43821 Vitreomacular adhesion, right eye: Secondary | ICD-10-CM | POA: Diagnosis not present

## 2021-02-05 DIAGNOSIS — M9905 Segmental and somatic dysfunction of pelvic region: Secondary | ICD-10-CM | POA: Diagnosis not present

## 2021-02-05 NOTE — Progress Notes (Signed)
02/05/2021     CHIEF COMPLAINT Patient presents for Retina Evaluation   HISTORY OF PRESENT ILLNESS: Scott Dodson is a 70 y.o. male who presents to the clinic today for:   HPI   Hx of Severe NPDR OU, and focal laser OD, July 2021. No changes in acuity, last CBG checked was February The patient does not participate with continuous monitoring or daily monitoring  Patient does not return Last edited by Hurman Horn, MD on 02/05/2021  8:17 AM.      Referring physician: Horald Pollen, MD [place],  [place] [address]  HISTORICAL INFORMATION:   Selected notes from the MEDICAL RECORD NUMBER    Lab Results  Component Value Date   HGBA1C 7.0 (A) 08/17/2020     CURRENT MEDICATIONS: No current outpatient medications on file. (Ophthalmic Drugs)   No current facility-administered medications for this visit. (Ophthalmic Drugs)   Current Outpatient Medications (Other)  Medication Sig   ALBUTEROL IN Inhale into the lungs.   amLODipine (NORVASC) 5 MG tablet TAKE 1 TABLET BY MOUTH  DAILY   Ascorbic Acid (VITAMIN C) 1000 MG tablet Take 2,000 mg by mouth daily.   aspirin 81 MG tablet Take 81 mg by mouth daily.   carvedilol (COREG) 25 MG tablet TAKE 1 TABLET BY MOUTH 2  TIMES DAILY WITH A MEAL.   cetirizine (ZYRTEC ALLERGY) 10 MG tablet Take 1 tablet (10 mg total) by mouth daily. (Patient not taking: Reported on 08/17/2020)   Coenzyme Q10 (CO Q 10 PO) Take by mouth.   fluticasone (FLONASE) 50 MCG/ACT nasal spray Place 2 sprays into both nostrils daily.   glipiZIDE (GLUCOTROL) 5 MG tablet TAKE 1 TABLET BY MOUTH  DAILY WITH BREAKFAST   lisinopril (ZESTRIL) 40 MG tablet TAKE 1 TABLET BY MOUTH  DAILY   metFORMIN (GLUCOPHAGE) 500 MG tablet TAKE 1 TABLET BY MOUTH  TWICE DAILY WITH A MEAL   rosuvastatin (CRESTOR) 20 MG tablet TAKE 1 TABLET BY MOUTH  DAILY   No current facility-administered medications for this visit. (Other)      REVIEW OF SYSTEMS:    ALLERGIES No  Known Allergies  PAST MEDICAL HISTORY Past Medical History:  Diagnosis Date   Allergy    Asthma    Coronary artery disease    s/p lateral MI 4 /11, s/p Promus DES OM2   Diabetes mellitus without complication (HCC)    Heart murmur    Hyperlipidemia    Hypertension    Myocardial infarct ([place]) 2011   Past Surgical History:  Procedure Laterality Date   ADENOIDECTOMY     CORONARY ARTERY BYPASS GRAFT  2011   CORONARY STENT PLACEMENT     TONSILLECTOMY     TYMPANIC MEMBRANE REPAIR Right 2004   VASECTOMY      FAMILY HISTORY Family History  Problem Relation Age of Onset   Heart disease Mother    Hypertension Mother    Heart disease Father    Hypertension Father    Heart disease Brother    Heart disease Sister    Colon cancer Maternal Grandfather 56   Colon polyps Neg Hx    Esophageal cancer Neg Hx    Stomach cancer Neg Hx    Rectal cancer Neg Hx     SOCIAL HISTORY Social History   Tobacco Use   Smoking status: Former    Packs/day: 0.50    Years: 5.00    Pack years: 2.50    Types: Cigarettes  Quit date: 10/14/1974    Years since quitting: 46.3   Smokeless tobacco: Never  Vaping Use   Vaping Use: Never used  Substance Use Topics   Alcohol use: Yes    Alcohol/week: 5.0 standard drinks    Types: 5 Standard drinks or equivalent per week   Drug use: No         OPHTHALMIC EXAM:  Base Eye Exam     Visual Acuity (ETDRS)       Right Left   Dist cc 20/20 20/20 -1    Correction: Glasses         Tonometry (Tonopen, 8:18 AM)       Right Left   Pressure 10 11         Pupils       Pupils APD   Right PERRL None   Left PERRL None         Visual Fields       Left Right    Full Full         Extraocular Movement       Right Left    Full, Ortho Full, Ortho         Neuro/Psych     Oriented x3: Yes   Mood/Affect: Normal         Dilation     Both eyes: 1.0% Mydriacyl, 2.5% Phenylephrine @ 8:15 AM           Slit Lamp and  Fundus Exam     External Exam       Right Left   External Normal Normal         Slit Lamp Exam       Right Left   Lids/Lashes Normal Normal   Conjunctiva/Sclera White and quiet White and quiet   Cornea Clear Clear   Anterior Chamber Deep and quiet Deep and quiet   Iris Round and reactive Round and reactive   Lens 2+ Nuclear sclerosis 2+ Nuclear sclerosis   Anterior Vitreous Normal Normal         Fundus Exam       Right Left   Posterior Vitreous Normal Normal   Disc Normal Normal   C/D Ratio 0.35 0.45   Macula no hemorrhage, Microaneurysms no hemorrhage, Microaneurysms, circinate ring inferotemporal, smaller post focal laser   Vessels NPDR-Severe NPDR-Severe   Periphery Normal Normal            IMAGING AND PROCEDURES  Imaging and Procedures for 02/05/21  OCT, Retina - OU - Both Eyes       Right Eye Quality was good. Scan locations included subfoveal. Central Foveal Thickness: 262. Progression has improved. Findings include vitreomacular adhesion , normal foveal contour.   Left Eye Quality was good. Scan locations included subfoveal. Central Foveal Thickness: 256. Progression has been stable. Findings include vitreomacular adhesion , normal foveal contour.   Notes No residual CSME OU.  Physiologic VMA OU OD, no CSME              ASSESSMENT/PLAN:  Age-related nuclear cataract of both eyes The nature of cataract was discussed with the patient as well as the elective nature of surgery. The patient was reassured that surgery at a later date does not put the patient at risk for a worse outcome. It was emphasized that the need for surgery is dictated by the patient's quality of life as influenced by the cataract. Patient was instructed to maintain close follow up with their general eye care doctor.  I have mentioned to the patient that the lens changes in the letter refer to his cataract in each eye are like having dark yellow-green sunglasses.  I reviewed  with him the symptoms that may develop as these progressive and further  Severe nonproliferative diabetic retinopathy of left eye, with macular edema, associated with type 2 diabetes mellitus (HCC) No signs of progression in either eye  Severe nonproliferative diabetic retinopathy of right eye without macular edema associated with type 2 diabetes mellitus ([place]) No signs of progression either eye  Vitreomacular adhesion of left eye Physiologic OU Minor  Vitreomacular adhesion of right eye Physiologic findings no effect on vision     ICD-10-CM   1. Severe nonproliferative diabetic retinopathy of left eye, with macular edema, associated with type 2 diabetes mellitus (HCC)  [id number] OCT, Retina - OU - Both Eyes    2. Severe nonproliferative diabetic retinopathy of right eye without macular edema associated with type 2 diabetes mellitus (HCC)  E11.3491 OCT, Retina - OU - Both Eyes    3. Vitreomacular adhesion of left eye  H43.822     4. Age-related nuclear cataract of both eyes  H25.13     5. Vitreomacular adhesion of right eye  H43.821       1.  Bilateral severe NPDR with no signs of progression we will continue to monitor and observe  2.  Physiologic VMA OU  3.  Moderate nuclear sclerotic changes, cataracts OU stable  Ophthalmic Meds Ordered this visit:  No orders of the defined types were placed in this encounter.      Return in about 6 months (around 08/08/2021) for DILATE OU, OCT.  There are no Patient Instructions on file for this visit.   Explained the diagnoses, plan, and follow up with the patient and they expressed understanding.  Patient expressed understanding of the importance of proper follow up care.   Clent Demark Milagro Belmares M.D. Diseases & Surgery of the Retina and Vitreous [facility] [place] 02/05/21     Abbreviations: M myopia (nearsighted); A astigmatism; H hyperopia (farsighted); P presbyopia; Mrx spectacle prescription;  CTL contact lenses; OD  right eye; OS left eye; OU both eyes  XT exotropia; ET esotropia; PEK punctate epithelial keratitis; PEE punctate epithelial erosions; DES dry eye syndrome; MGD meibomian gland dysfunction; ATs artificial tears; PFAT's preservative free artificial tears; [place] nuclear sclerotic cataract; PSC posterior subcapsular cataract; ERM epi-retinal membrane; PVD posterior vitreous detachment; RD retinal detachment; DM diabetes mellitus; DR diabetic retinopathy; NPDR non-proliferative diabetic retinopathy; PDR proliferative diabetic retinopathy; CSME clinically significant macular edema; DME diabetic macular edema; dbh dot blot hemorrhages; CWS cotton wool spot; POAG primary open angle glaucoma; C/D cup-to-disc ratio; HVF humphrey visual field; GVF goldmann visual field; OCT optical coherence tomography; IOP intraocular pressure; BRVO Branch retinal vein occlusion; CRVO central retinal vein occlusion; CRAO central retinal artery occlusion; BRAO branch retinal artery occlusion; RT retinal tear; SB scleral buckle; PPV pars plana vitrectomy; VH Vitreous hemorrhage; PRP panretinal laser photocoagulation; IVK intravitreal kenalog; VMT vitreomacular traction; MH Macular hole;  NVD neovascularization of the disc; NVE neovascularization elsewhere; AREDS age related eye disease study; ARMD age related macular degeneration; POAG primary open angle glaucoma; EBMD epithelial/anterior basement membrane dystrophy; ACIOL anterior chamber intraocular lens; IOL intraocular lens; PCIOL posterior chamber intraocular lens; Phaco/IOL phacoemulsification with intraocular lens placement; [place] photorefractive keratectomy; LASIK laser assisted in situ keratomileusis; HTN hypertension; DM diabetes mellitus; COPD chronic obstructive pulmonary disease

## 2021-02-05 NOTE — Assessment & Plan Note (Signed)
Physiologic findings no effect on vision

## 2021-02-05 NOTE — Assessment & Plan Note (Signed)
The nature of cataract was discussed with the patient as well as the elective nature of surgery. The patient was reassured that surgery at a later date does not put the patient at risk for a worse outcome. It was emphasized that the need for surgery is dictated by the patient's quality of life as influenced by the cataract. Patient was instructed to maintain close follow up with their general eye care doctor. I have mentioned to the patient that the lens changes in the letter refer to his cataract in each eye are like having dark yellow-green sunglasses.  I reviewed with him the symptoms that may develop as these progressive and further

## 2021-02-05 NOTE — Assessment & Plan Note (Signed)
Physiologic OU Minor

## 2021-02-05 NOTE — Assessment & Plan Note (Signed)
No signs of progression in either eye

## 2021-02-05 NOTE — Assessment & Plan Note (Signed)
No signs of progression either eye

## 2021-02-07 DIAGNOSIS — M9901 Segmental and somatic dysfunction of cervical region: Secondary | ICD-10-CM | POA: Diagnosis not present

## 2021-02-07 DIAGNOSIS — M9905 Segmental and somatic dysfunction of pelvic region: Secondary | ICD-10-CM | POA: Diagnosis not present

## 2021-02-07 DIAGNOSIS — M9904 Segmental and somatic dysfunction of sacral region: Secondary | ICD-10-CM | POA: Diagnosis not present

## 2021-02-07 DIAGNOSIS — M9903 Segmental and somatic dysfunction of lumbar region: Secondary | ICD-10-CM | POA: Diagnosis not present

## 2021-02-08 DIAGNOSIS — M9903 Segmental and somatic dysfunction of lumbar region: Secondary | ICD-10-CM | POA: Diagnosis not present

## 2021-02-08 DIAGNOSIS — M9904 Segmental and somatic dysfunction of sacral region: Secondary | ICD-10-CM | POA: Diagnosis not present

## 2021-02-08 DIAGNOSIS — M9901 Segmental and somatic dysfunction of cervical region: Secondary | ICD-10-CM | POA: Diagnosis not present

## 2021-02-08 DIAGNOSIS — M9905 Segmental and somatic dysfunction of pelvic region: Secondary | ICD-10-CM | POA: Diagnosis not present

## 2021-02-15 ENCOUNTER — Ambulatory Visit: Payer: Self-pay | Admitting: Emergency Medicine

## 2021-02-21 DIAGNOSIS — M9903 Segmental and somatic dysfunction of lumbar region: Secondary | ICD-10-CM | POA: Diagnosis not present

## 2021-02-21 DIAGNOSIS — M9905 Segmental and somatic dysfunction of pelvic region: Secondary | ICD-10-CM | POA: Diagnosis not present

## 2021-02-21 DIAGNOSIS — M9904 Segmental and somatic dysfunction of sacral region: Secondary | ICD-10-CM | POA: Diagnosis not present

## 2021-02-21 DIAGNOSIS — M9901 Segmental and somatic dysfunction of cervical region: Secondary | ICD-10-CM | POA: Diagnosis not present

## 2021-02-22 DIAGNOSIS — M9903 Segmental and somatic dysfunction of lumbar region: Secondary | ICD-10-CM | POA: Diagnosis not present

## 2021-02-22 DIAGNOSIS — M9901 Segmental and somatic dysfunction of cervical region: Secondary | ICD-10-CM | POA: Diagnosis not present

## 2021-02-22 DIAGNOSIS — M9904 Segmental and somatic dysfunction of sacral region: Secondary | ICD-10-CM | POA: Diagnosis not present

## 2021-02-22 DIAGNOSIS — M9905 Segmental and somatic dysfunction of pelvic region: Secondary | ICD-10-CM | POA: Diagnosis not present

## 2021-02-26 DIAGNOSIS — M9905 Segmental and somatic dysfunction of pelvic region: Secondary | ICD-10-CM | POA: Diagnosis not present

## 2021-02-26 DIAGNOSIS — M9903 Segmental and somatic dysfunction of lumbar region: Secondary | ICD-10-CM | POA: Diagnosis not present

## 2021-02-26 DIAGNOSIS — M9901 Segmental and somatic dysfunction of cervical region: Secondary | ICD-10-CM | POA: Diagnosis not present

## 2021-02-26 DIAGNOSIS — M9904 Segmental and somatic dysfunction of sacral region: Secondary | ICD-10-CM | POA: Diagnosis not present

## 2021-02-28 DIAGNOSIS — M9903 Segmental and somatic dysfunction of lumbar region: Secondary | ICD-10-CM | POA: Diagnosis not present

## 2021-02-28 DIAGNOSIS — M9904 Segmental and somatic dysfunction of sacral region: Secondary | ICD-10-CM | POA: Diagnosis not present

## 2021-02-28 DIAGNOSIS — M9901 Segmental and somatic dysfunction of cervical region: Secondary | ICD-10-CM | POA: Diagnosis not present

## 2021-02-28 DIAGNOSIS — M9905 Segmental and somatic dysfunction of pelvic region: Secondary | ICD-10-CM | POA: Diagnosis not present

## 2021-03-02 DIAGNOSIS — M9904 Segmental and somatic dysfunction of sacral region: Secondary | ICD-10-CM | POA: Diagnosis not present

## 2021-03-02 DIAGNOSIS — M9903 Segmental and somatic dysfunction of lumbar region: Secondary | ICD-10-CM | POA: Diagnosis not present

## 2021-03-02 DIAGNOSIS — M9901 Segmental and somatic dysfunction of cervical region: Secondary | ICD-10-CM | POA: Diagnosis not present

## 2021-03-02 DIAGNOSIS — M9905 Segmental and somatic dysfunction of pelvic region: Secondary | ICD-10-CM | POA: Diagnosis not present

## 2021-03-06 DIAGNOSIS — M9905 Segmental and somatic dysfunction of pelvic region: Secondary | ICD-10-CM | POA: Diagnosis not present

## 2021-03-06 DIAGNOSIS — M9901 Segmental and somatic dysfunction of cervical region: Secondary | ICD-10-CM | POA: Diagnosis not present

## 2021-03-06 DIAGNOSIS — M9903 Segmental and somatic dysfunction of lumbar region: Secondary | ICD-10-CM | POA: Diagnosis not present

## 2021-03-06 DIAGNOSIS — M9904 Segmental and somatic dysfunction of sacral region: Secondary | ICD-10-CM | POA: Diagnosis not present

## 2021-03-11 ENCOUNTER — Other Ambulatory Visit: Payer: Self-pay | Admitting: Emergency Medicine

## 2021-03-16 DIAGNOSIS — M9901 Segmental and somatic dysfunction of cervical region: Secondary | ICD-10-CM | POA: Diagnosis not present

## 2021-03-16 DIAGNOSIS — M9905 Segmental and somatic dysfunction of pelvic region: Secondary | ICD-10-CM | POA: Diagnosis not present

## 2021-03-16 DIAGNOSIS — M9904 Segmental and somatic dysfunction of sacral region: Secondary | ICD-10-CM | POA: Diagnosis not present

## 2021-03-16 DIAGNOSIS — M9903 Segmental and somatic dysfunction of lumbar region: Secondary | ICD-10-CM | POA: Diagnosis not present

## 2021-03-17 DIAGNOSIS — Z20822 Contact with and (suspected) exposure to covid-19: Secondary | ICD-10-CM | POA: Diagnosis not present

## 2021-03-18 DIAGNOSIS — U071 COVID-19: Secondary | ICD-10-CM | POA: Diagnosis not present

## 2021-03-21 DIAGNOSIS — M9901 Segmental and somatic dysfunction of cervical region: Secondary | ICD-10-CM | POA: Diagnosis not present

## 2021-03-21 DIAGNOSIS — M9904 Segmental and somatic dysfunction of sacral region: Secondary | ICD-10-CM | POA: Diagnosis not present

## 2021-03-21 DIAGNOSIS — M9905 Segmental and somatic dysfunction of pelvic region: Secondary | ICD-10-CM | POA: Diagnosis not present

## 2021-03-21 DIAGNOSIS — M9903 Segmental and somatic dysfunction of lumbar region: Secondary | ICD-10-CM | POA: Diagnosis not present

## 2021-03-23 DIAGNOSIS — M9904 Segmental and somatic dysfunction of sacral region: Secondary | ICD-10-CM | POA: Diagnosis not present

## 2021-03-23 DIAGNOSIS — M9903 Segmental and somatic dysfunction of lumbar region: Secondary | ICD-10-CM | POA: Diagnosis not present

## 2021-03-23 DIAGNOSIS — M9905 Segmental and somatic dysfunction of pelvic region: Secondary | ICD-10-CM | POA: Diagnosis not present

## 2021-03-23 DIAGNOSIS — M9901 Segmental and somatic dysfunction of cervical region: Secondary | ICD-10-CM | POA: Diagnosis not present

## 2021-03-26 DIAGNOSIS — M9903 Segmental and somatic dysfunction of lumbar region: Secondary | ICD-10-CM | POA: Diagnosis not present

## 2021-03-26 DIAGNOSIS — M9905 Segmental and somatic dysfunction of pelvic region: Secondary | ICD-10-CM | POA: Diagnosis not present

## 2021-03-26 DIAGNOSIS — M9904 Segmental and somatic dysfunction of sacral region: Secondary | ICD-10-CM | POA: Diagnosis not present

## 2021-03-26 DIAGNOSIS — M9901 Segmental and somatic dysfunction of cervical region: Secondary | ICD-10-CM | POA: Diagnosis not present

## 2021-03-28 DIAGNOSIS — M9901 Segmental and somatic dysfunction of cervical region: Secondary | ICD-10-CM | POA: Diagnosis not present

## 2021-03-28 DIAGNOSIS — M9904 Segmental and somatic dysfunction of sacral region: Secondary | ICD-10-CM | POA: Diagnosis not present

## 2021-03-28 DIAGNOSIS — M9905 Segmental and somatic dysfunction of pelvic region: Secondary | ICD-10-CM | POA: Diagnosis not present

## 2021-03-28 DIAGNOSIS — M9903 Segmental and somatic dysfunction of lumbar region: Secondary | ICD-10-CM | POA: Diagnosis not present

## 2021-04-02 DIAGNOSIS — M9905 Segmental and somatic dysfunction of pelvic region: Secondary | ICD-10-CM | POA: Diagnosis not present

## 2021-04-02 DIAGNOSIS — M9903 Segmental and somatic dysfunction of lumbar region: Secondary | ICD-10-CM | POA: Diagnosis not present

## 2021-04-02 DIAGNOSIS — M9901 Segmental and somatic dysfunction of cervical region: Secondary | ICD-10-CM | POA: Diagnosis not present

## 2021-04-02 DIAGNOSIS — M9904 Segmental and somatic dysfunction of sacral region: Secondary | ICD-10-CM | POA: Diagnosis not present

## 2021-04-04 DIAGNOSIS — M9903 Segmental and somatic dysfunction of lumbar region: Secondary | ICD-10-CM | POA: Diagnosis not present

## 2021-04-04 DIAGNOSIS — M9901 Segmental and somatic dysfunction of cervical region: Secondary | ICD-10-CM | POA: Diagnosis not present

## 2021-04-04 DIAGNOSIS — M9905 Segmental and somatic dysfunction of pelvic region: Secondary | ICD-10-CM | POA: Diagnosis not present

## 2021-04-04 DIAGNOSIS — M9904 Segmental and somatic dysfunction of sacral region: Secondary | ICD-10-CM | POA: Diagnosis not present

## 2021-04-06 DIAGNOSIS — M9905 Segmental and somatic dysfunction of pelvic region: Secondary | ICD-10-CM | POA: Diagnosis not present

## 2021-04-06 DIAGNOSIS — M9904 Segmental and somatic dysfunction of sacral region: Secondary | ICD-10-CM | POA: Diagnosis not present

## 2021-04-06 DIAGNOSIS — M9903 Segmental and somatic dysfunction of lumbar region: Secondary | ICD-10-CM | POA: Diagnosis not present

## 2021-04-06 DIAGNOSIS — M9901 Segmental and somatic dysfunction of cervical region: Secondary | ICD-10-CM | POA: Diagnosis not present

## 2021-04-10 DIAGNOSIS — M9904 Segmental and somatic dysfunction of sacral region: Secondary | ICD-10-CM | POA: Diagnosis not present

## 2021-04-10 DIAGNOSIS — M9901 Segmental and somatic dysfunction of cervical region: Secondary | ICD-10-CM | POA: Diagnosis not present

## 2021-04-10 DIAGNOSIS — M9905 Segmental and somatic dysfunction of pelvic region: Secondary | ICD-10-CM | POA: Diagnosis not present

## 2021-04-10 DIAGNOSIS — M9903 Segmental and somatic dysfunction of lumbar region: Secondary | ICD-10-CM | POA: Diagnosis not present

## 2021-04-12 DIAGNOSIS — M9904 Segmental and somatic dysfunction of sacral region: Secondary | ICD-10-CM | POA: Diagnosis not present

## 2021-04-12 DIAGNOSIS — M9901 Segmental and somatic dysfunction of cervical region: Secondary | ICD-10-CM | POA: Diagnosis not present

## 2021-04-12 DIAGNOSIS — M9905 Segmental and somatic dysfunction of pelvic region: Secondary | ICD-10-CM | POA: Diagnosis not present

## 2021-04-12 DIAGNOSIS — M9903 Segmental and somatic dysfunction of lumbar region: Secondary | ICD-10-CM | POA: Diagnosis not present

## 2021-04-19 DIAGNOSIS — M9904 Segmental and somatic dysfunction of sacral region: Secondary | ICD-10-CM | POA: Diagnosis not present

## 2021-04-19 DIAGNOSIS — M9903 Segmental and somatic dysfunction of lumbar region: Secondary | ICD-10-CM | POA: Diagnosis not present

## 2021-04-19 DIAGNOSIS — M9905 Segmental and somatic dysfunction of pelvic region: Secondary | ICD-10-CM | POA: Diagnosis not present

## 2021-04-19 DIAGNOSIS — M9901 Segmental and somatic dysfunction of cervical region: Secondary | ICD-10-CM | POA: Diagnosis not present

## 2021-04-23 DIAGNOSIS — M9905 Segmental and somatic dysfunction of pelvic region: Secondary | ICD-10-CM | POA: Diagnosis not present

## 2021-04-23 DIAGNOSIS — M9903 Segmental and somatic dysfunction of lumbar region: Secondary | ICD-10-CM | POA: Diagnosis not present

## 2021-04-23 DIAGNOSIS — M9901 Segmental and somatic dysfunction of cervical region: Secondary | ICD-10-CM | POA: Diagnosis not present

## 2021-04-23 DIAGNOSIS — M9904 Segmental and somatic dysfunction of sacral region: Secondary | ICD-10-CM | POA: Diagnosis not present

## 2021-04-30 DIAGNOSIS — M9905 Segmental and somatic dysfunction of pelvic region: Secondary | ICD-10-CM | POA: Diagnosis not present

## 2021-04-30 DIAGNOSIS — M9901 Segmental and somatic dysfunction of cervical region: Secondary | ICD-10-CM | POA: Diagnosis not present

## 2021-04-30 DIAGNOSIS — M9904 Segmental and somatic dysfunction of sacral region: Secondary | ICD-10-CM | POA: Diagnosis not present

## 2021-04-30 DIAGNOSIS — M9903 Segmental and somatic dysfunction of lumbar region: Secondary | ICD-10-CM | POA: Diagnosis not present

## 2021-05-07 DIAGNOSIS — M9904 Segmental and somatic dysfunction of sacral region: Secondary | ICD-10-CM | POA: Diagnosis not present

## 2021-05-07 DIAGNOSIS — M9903 Segmental and somatic dysfunction of lumbar region: Secondary | ICD-10-CM | POA: Diagnosis not present

## 2021-05-07 DIAGNOSIS — M9905 Segmental and somatic dysfunction of pelvic region: Secondary | ICD-10-CM | POA: Diagnosis not present

## 2021-05-07 DIAGNOSIS — M9901 Segmental and somatic dysfunction of cervical region: Secondary | ICD-10-CM | POA: Diagnosis not present

## 2021-05-14 DIAGNOSIS — M9901 Segmental and somatic dysfunction of cervical region: Secondary | ICD-10-CM | POA: Diagnosis not present

## 2021-05-14 DIAGNOSIS — M9904 Segmental and somatic dysfunction of sacral region: Secondary | ICD-10-CM | POA: Diagnosis not present

## 2021-05-14 DIAGNOSIS — M9903 Segmental and somatic dysfunction of lumbar region: Secondary | ICD-10-CM | POA: Diagnosis not present

## 2021-05-14 DIAGNOSIS — M9905 Segmental and somatic dysfunction of pelvic region: Secondary | ICD-10-CM | POA: Diagnosis not present

## 2021-05-21 DIAGNOSIS — M9905 Segmental and somatic dysfunction of pelvic region: Secondary | ICD-10-CM | POA: Diagnosis not present

## 2021-05-21 DIAGNOSIS — M9904 Segmental and somatic dysfunction of sacral region: Secondary | ICD-10-CM | POA: Diagnosis not present

## 2021-05-21 DIAGNOSIS — M9901 Segmental and somatic dysfunction of cervical region: Secondary | ICD-10-CM | POA: Diagnosis not present

## 2021-05-21 DIAGNOSIS — M9903 Segmental and somatic dysfunction of lumbar region: Secondary | ICD-10-CM | POA: Diagnosis not present

## 2021-05-28 DIAGNOSIS — M9905 Segmental and somatic dysfunction of pelvic region: Secondary | ICD-10-CM | POA: Diagnosis not present

## 2021-05-28 DIAGNOSIS — M9901 Segmental and somatic dysfunction of cervical region: Secondary | ICD-10-CM | POA: Diagnosis not present

## 2021-05-28 DIAGNOSIS — M9903 Segmental and somatic dysfunction of lumbar region: Secondary | ICD-10-CM | POA: Diagnosis not present

## 2021-05-28 DIAGNOSIS — M9904 Segmental and somatic dysfunction of sacral region: Secondary | ICD-10-CM | POA: Diagnosis not present

## 2021-06-04 DIAGNOSIS — M9903 Segmental and somatic dysfunction of lumbar region: Secondary | ICD-10-CM | POA: Diagnosis not present

## 2021-06-04 DIAGNOSIS — M9901 Segmental and somatic dysfunction of cervical region: Secondary | ICD-10-CM | POA: Diagnosis not present

## 2021-06-04 DIAGNOSIS — M9904 Segmental and somatic dysfunction of sacral region: Secondary | ICD-10-CM | POA: Diagnosis not present

## 2021-06-04 DIAGNOSIS — M9905 Segmental and somatic dysfunction of pelvic region: Secondary | ICD-10-CM | POA: Diagnosis not present

## 2021-08-13 ENCOUNTER — Encounter (INDEPENDENT_AMBULATORY_CARE_PROVIDER_SITE_OTHER): Payer: Medicare Other | Admitting: Ophthalmology

## 2021-12-17 ENCOUNTER — Other Ambulatory Visit: Payer: Self-pay | Admitting: Emergency Medicine

## 2021-12-17 DIAGNOSIS — E785 Hyperlipidemia, unspecified: Secondary | ICD-10-CM

## 2021-12-17 DIAGNOSIS — I10 Essential (primary) hypertension: Secondary | ICD-10-CM

## 2021-12-17 DIAGNOSIS — E1165 Type 2 diabetes mellitus with hyperglycemia: Secondary | ICD-10-CM

## 2021-12-17 DIAGNOSIS — E1169 Type 2 diabetes mellitus with other specified complication: Secondary | ICD-10-CM

## 2022-02-06 ENCOUNTER — Other Ambulatory Visit: Payer: Self-pay | Admitting: Emergency Medicine

## 2022-04-08 ENCOUNTER — Other Ambulatory Visit: Payer: Self-pay | Admitting: Emergency Medicine

## 2022-04-08 DIAGNOSIS — I10 Essential (primary) hypertension: Secondary | ICD-10-CM

## 2022-04-08 DIAGNOSIS — E1169 Type 2 diabetes mellitus with other specified complication: Secondary | ICD-10-CM

## 2022-04-08 DIAGNOSIS — E1165 Type 2 diabetes mellitus with hyperglycemia: Secondary | ICD-10-CM

## 2022-04-22 ENCOUNTER — Other Ambulatory Visit: Payer: Self-pay | Admitting: Emergency Medicine

## 2022-04-22 DIAGNOSIS — E1165 Type 2 diabetes mellitus with hyperglycemia: Secondary | ICD-10-CM

## 2022-04-22 DIAGNOSIS — E1169 Type 2 diabetes mellitus with other specified complication: Secondary | ICD-10-CM

## 2022-04-22 DIAGNOSIS — I10 Essential (primary) hypertension: Secondary | ICD-10-CM

## 2022-05-21 ENCOUNTER — Ambulatory Visit (INDEPENDENT_AMBULATORY_CARE_PROVIDER_SITE_OTHER): Payer: Medicare Other | Admitting: Emergency Medicine

## 2022-05-21 ENCOUNTER — Encounter: Payer: Self-pay | Admitting: Gastroenterology

## 2022-05-21 ENCOUNTER — Encounter: Payer: Self-pay | Admitting: Emergency Medicine

## 2022-05-21 VITALS — BP 136/76 | HR 61 | Temp 97.7°F | Ht 69.0 in | Wt 205.0 lb

## 2022-05-21 DIAGNOSIS — E1159 Type 2 diabetes mellitus with other circulatory complications: Secondary | ICD-10-CM

## 2022-05-21 DIAGNOSIS — E1165 Type 2 diabetes mellitus with hyperglycemia: Secondary | ICD-10-CM

## 2022-05-21 DIAGNOSIS — M545 Low back pain, unspecified: Secondary | ICD-10-CM | POA: Insufficient documentation

## 2022-05-21 DIAGNOSIS — M25552 Pain in left hip: Secondary | ICD-10-CM | POA: Diagnosis not present

## 2022-05-21 DIAGNOSIS — E785 Hyperlipidemia, unspecified: Secondary | ICD-10-CM | POA: Diagnosis not present

## 2022-05-21 DIAGNOSIS — I152 Hypertension secondary to endocrine disorders: Secondary | ICD-10-CM

## 2022-05-21 DIAGNOSIS — I10 Essential (primary) hypertension: Secondary | ICD-10-CM | POA: Diagnosis not present

## 2022-05-21 DIAGNOSIS — I255 Ischemic cardiomyopathy: Secondary | ICD-10-CM | POA: Diagnosis not present

## 2022-05-21 DIAGNOSIS — E1169 Type 2 diabetes mellitus with other specified complication: Secondary | ICD-10-CM

## 2022-05-21 DIAGNOSIS — R1319 Other dysphagia: Secondary | ICD-10-CM

## 2022-05-21 DIAGNOSIS — I2510 Atherosclerotic heart disease of native coronary artery without angina pectoris: Secondary | ICD-10-CM | POA: Diagnosis not present

## 2022-05-21 DIAGNOSIS — G8929 Other chronic pain: Secondary | ICD-10-CM | POA: Insufficient documentation

## 2022-05-21 LAB — CBC WITH DIFFERENTIAL/PLATELET
Basophils Absolute: 0 10*3/uL (ref 0.0–0.1)
Basophils Relative: 0.7 % (ref 0.0–3.0)
Eosinophils Absolute: 0.3 10*3/uL (ref 0.0–0.7)
Eosinophils Relative: 5.6 % — ABNORMAL HIGH (ref 0.0–5.0)
HCT: 46.3 % (ref 39.0–52.0)
Hemoglobin: 15.9 g/dL (ref 13.0–17.0)
Lymphocytes Relative: 24.5 % (ref 12.0–46.0)
Lymphs Abs: 1.5 10*3/uL (ref 0.7–4.0)
MCHC: 34.4 g/dL (ref 30.0–36.0)
MCV: 86.9 fl (ref 78.0–100.0)
Monocytes Absolute: 0.6 10*3/uL (ref 0.1–1.0)
Monocytes Relative: 10.6 % (ref 3.0–12.0)
Neutro Abs: 3.5 10*3/uL (ref 1.4–7.7)
Neutrophils Relative %: 58.6 % (ref 43.0–77.0)
Platelets: 173 10*3/uL (ref 150.0–400.0)
RBC: 5.32 Mil/uL (ref 4.22–5.81)
RDW: 13.4 % (ref 11.5–15.5)
WBC: 5.9 10*3/uL (ref 4.0–10.5)

## 2022-05-21 LAB — MICROALBUMIN / CREATININE URINE RATIO
Creatinine,U: 101.1 mg/dL
Microalb Creat Ratio: 2.2 mg/g (ref 0.0–30.0)
Microalb, Ur: 2.2 mg/dL — ABNORMAL HIGH (ref 0.0–1.9)

## 2022-05-21 LAB — POCT GLYCOSYLATED HEMOGLOBIN (HGB A1C): Hemoglobin A1C: 6.4 % — AB (ref 4.0–5.6)

## 2022-05-21 LAB — LIPID PANEL
Cholesterol: 172 mg/dL (ref 0–200)
HDL: 56 mg/dL (ref >39.00)
NonHDL: 116.38
Total CHOL/HDL Ratio: 3
Triglycerides: 275 mg/dL — ABNORMAL HIGH (ref 0.0–149.0)
VLDL: 55 mg/dL — ABNORMAL HIGH (ref 0.0–40.0)

## 2022-05-21 LAB — COMPREHENSIVE METABOLIC PANEL
ALT: 18 U/L (ref 0–53)
AST: 20 U/L (ref 0–37)
Albumin: 4.8 g/dL (ref 3.5–5.2)
Alkaline Phosphatase: 48 U/L (ref 39–117)
BUN: 26 mg/dL — ABNORMAL HIGH (ref 6–23)
CO2: 27 mEq/L (ref 19–32)
Calcium: 9.7 mg/dL (ref 8.4–10.5)
Chloride: 100 mEq/L (ref 96–112)
Creatinine, Ser: 1.2 mg/dL (ref 0.40–1.50)
GFR: 61.79 mL/min (ref >60.00)
Glucose, Bld: 125 mg/dL — ABNORMAL HIGH (ref 70–99)
Potassium: 4.2 mEq/L (ref 3.5–5.1)
Sodium: 138 mEq/L (ref 135–145)
Total Bilirubin: 0.8 mg/dL (ref 0.2–1.2)
Total Protein: 7.3 g/dL (ref 6.0–8.3)

## 2022-05-21 LAB — LDL CHOLESTEROL, DIRECT: Direct LDL: 77 mg/dL

## 2022-05-21 MED ORDER — DAPAGLIFLOZIN PROPANEDIOL 10 MG PO TABS: 10.0000 mg | ORAL_TABLET | Freq: Every day | ORAL | 3 refills | Status: DC

## 2022-05-21 MED ORDER — CARVEDILOL 25 MG PO TABS: 25.0000 mg | ORAL_TABLET | Freq: Two times a day (BID) | ORAL | 3 refills | Status: DC

## 2022-05-21 MED ORDER — LISINOPRIL 40 MG PO TABS: 40.0000 mg | ORAL_TABLET | Freq: Every day | ORAL | 3 refills | Status: DC

## 2022-05-21 MED ORDER — METFORMIN HCL 500 MG PO TABS: 500.0000 mg | ORAL_TABLET | Freq: Two times a day (BID) | ORAL | 3 refills | Status: DC

## 2022-05-21 MED ORDER — AMLODIPINE BESYLATE 5 MG PO TABS: 5.0000 mg | ORAL_TABLET | Freq: Every day | ORAL | 0 refills | Status: DC

## 2022-05-21 MED ORDER — ROSUVASTATIN CALCIUM 20 MG PO TABS: 20.0000 mg | ORAL_TABLET | Freq: Every day | ORAL | 3 refills | Status: DC

## 2022-05-21 MED ORDER — GLIPIZIDE 5 MG PO TABS: 5.0000 mg | ORAL_TABLET | Freq: Every day | ORAL | 3 refills | Status: DC

## 2022-05-21 NOTE — Assessment & Plan Note (Signed)
Well-controlled hypertension. Continue lisinopril 40 mg daily and amlodipine 5 mg daily. BP Readings from Last 3 Encounters:  05/21/22 136/76  08/17/20 136/80  04/26/20 (!) 153/81  Well-controlled diabetes with hemoglobin A1c of 6.4 Continue metformin 500 mg twice a day and glipizide 5 mg daily. Recommend to start Farxiga 10 mg daily Cardiovascular risks associated with hypertension and diabetes discussed Diet and nutrition discussed Follow-up in 6 months.

## 2022-05-21 NOTE — Progress Notes (Signed)
Scott Dodson 71 y.o.   Chief Complaint  Patient presents with   Follow-up    F/u appt, has not been seen since 2022, several concerns     HISTORY OF PRESENT ILLNESS: This is a 71 y.o. male here for follow-up of diabetes hypertension and dyslipidemia. Last office visit early 2022.  HPI   Prior to Admission medications   Medication Sig Start Date End Date Taking? Authorizing Provider  ALBUTEROL IN Inhale into the lungs.    [provider]  amLODipine (NORVASC) 5 MG tablet TAKE 1 TABLET BY MOUTH  DAILY 04/08/22   Horald Pollen, MD  Ascorbic Acid (VITAMIN C) 1000 MG tablet Take 2,000 mg by mouth daily.    [provider]  aspirin 81 MG tablet Take 81 mg by mouth daily.    [provider]  carvedilol (COREG) 25 MG tablet Take 1 tablet (25 mg total) by mouth 2 (two) times daily with a meal. TAKE 1 TABLET BY MOUTH  TWICE DAILY WITH MEALS 02/06/22 05/07/22  Horald Pollen, MD  cetirizine (ZYRTEC ALLERGY) 10 MG tablet Take 1 tablet (10 mg total) by mouth daily. Patient not taking: Reported on 08/17/2020 10/13/17   Jaynee Eagles, PA-C  Coenzyme Q10 (CO Q 10 PO) Take by mouth.    [provider]  fluticasone (FLONASE) 50 MCG/ACT nasal spray Place 2 sprays into both nostrils daily. 10/13/17   Jaynee Eagles, PA-C  glipiZIDE (GLUCOTROL) 5 MG tablet TAKE 1 TABLET BY MOUTH  DAILY WITH BREAKFAST 04/08/22   Horald Pollen, MD  lisinopril (ZESTRIL) 40 MG tablet TAKE 1 TABLET BY MOUTH  DAILY 01/15/21   Horald Pollen, MD  metFORMIN (GLUCOPHAGE) 500 MG tablet TAKE 1 TABLET BY MOUTH  TWICE DAILY WITH A MEAL 01/15/21   Horald Pollen, MD  rosuvastatin ([place]) 20 MG tablet TAKE 1 TABLET BY MOUTH  DAILY 04/08/22   Horald Pollen, MD    No Known Allergies  Patient Active Problem List   Diagnosis Date Noted   Ischemic cardiomyopathy 05/21/2022   Age-related nuclear cataract of both eyes 02/05/2021   Vitreomacular adhesion of left eye  12/13/2019   Severe nonproliferative diabetic retinopathy of right eye without macular edema associated with type 2 diabetes mellitus ([place]) 12/13/2019   Severe nonproliferative diabetic retinopathy of left eye, with macular edema, associated with type 2 diabetes mellitus ([place]) 12/13/2019   Vitreomacular adhesion of right eye 12/13/2019   History of diabetes mellitus 02/09/2018   CARDIOMYOPATHY, ISCHEMIC 05/18/2010   Coronary atherosclerosis 10/11/2009   Dyslipidemia associated with type 2 diabetes mellitus ([place]) 10/09/2009   HYPERLIPIDEMIA 10/09/2009   Hypertension associated with diabetes ([place]) 10/09/2009   MYOCARDIAL INFARCTION, HX OF 10/09/2009    Past Medical History:  Diagnosis Date   Allergy    Asthma    Coronary artery disease    s/p lateral MI 4 /11, s/p Promus DES OM2   Diabetes mellitus without complication ([place])    Heart murmur    Hyperlipidemia    Hypertension    Myocardial infarct ([place]) 2011    Past Surgical History:  Procedure Laterality Date   ADENOIDECTOMY     CORONARY ARTERY BYPASS GRAFT  2011   CORONARY STENT PLACEMENT     TONSILLECTOMY     TYMPANIC MEMBRANE REPAIR Right 2004   VASECTOMY      Social History   Socioeconomic History   Marital status: Married    Spouse name: Not on file   Number of  children: 3   Years of education: Not on file   Highest education level: Not on file  Occupational History   Occupation: Charity fundraiser   Occupation: retired  Tobacco Use   Smoking status: Former    Packs/day: 0.50    Years: 5.00    Total pack years: 2.50    Types: Cigarettes    Quit date: 10/14/1974    Years since quitting: 47.6   Smokeless tobacco: Never  Vaping Use   Vaping Use: Never used  Substance and Sexual Activity   Alcohol use: Yes    Alcohol/week: 5.0 standard drinks of alcohol    Types: 5 Standard drinks or equivalent per week   Drug use: No   Sexual activity: Yes  Other Topics Concern   Not on file  Social History  Narrative   Occupation- Photographer    Former -tobacco -stopped 1979   Married ,3 children     Alcohol use - yes , social    Drug use - no   Regular exercise- yes    Social Determinants of Health   Financial Resource Strain: Low Risk  (10/14/2018)   Overall Financial Resource Strain (CARDIA)    Difficulty of Paying Living Expenses: Not hard at all  Food Insecurity: No Food Insecurity (10/14/2018)   Hunger Vital Sign    Worried About Running Out of Food in the Last Year: Never true    [place] in the Last Year: Never true  Transportation Needs: No Transportation Needs (10/14/2018)   PRAPARE - Hydrologist (Medical): No    Lack of Transportation (Non-Medical): No  Physical Activity: Sufficiently Active (10/14/2018)   Exercise Vital Sign    Days of Exercise per Week: 3 days    Minutes of Exercise per Session: 100 min  Stress: No Stress Concern Present (10/14/2018)   [place]    Feeling of Stress : Not at all  Social Connections: Moderately Integrated (10/14/2018)   Social Connection and Isolation Panel [NHANES]    Frequency of Communication with Friends and Family: Once a week    Frequency of Social Gatherings with Friends and Family: Once a week    Attends Religious Services: More than 4 times per year    Active Member of [employer] or Organizations: Yes    Attends Archivist Meetings: 1 to 4 times per year    Marital Status: Married  Human resources officer Violence: Not At Risk (10/14/2018)   Humiliation, Afraid, Rape, and Kick questionnaire    Fear of Current or Ex-Partner: No    Emotionally Abused: No    Physically Abused: No    Sexually Abused: No    Family History  Problem Relation Age of Onset   Heart disease Mother    Hypertension Mother    Heart disease Father    Hypertension Father    Heart disease Brother    Heart disease Sister    Colon cancer  Maternal Grandfather 92   Colon polyps Neg Hx    Esophageal cancer Neg Hx    Stomach cancer Neg Hx    Rectal cancer Neg Hx      Review of Systems  Constitutional: Negative.  Negative for chills and fever.  HENT: Negative.  Negative for congestion and sore throat.   Respiratory: Negative.  Negative for cough and shortness of breath.   Cardiovascular: Negative.  Negative for chest pain and  palpitations.  Gastrointestinal: Negative.  Negative for abdominal pain, diarrhea, nausea and vomiting.       Dysphagia to solids  Genitourinary: Negative.  Negative for dysuria and hematuria.  Musculoskeletal:  Positive for back pain and joint pain.  Skin: Negative.   Neurological: Negative.  Negative for dizziness and headaches.  All other systems reviewed and are negative.   Today's Vitals   05/21/22 0813  BP: 136/76  Pulse: 61  Temp: 97.7 F (36.5 C)  TempSrc: Oral  SpO2: 92%  Weight: 205 lb (93 kg)  Height: '5\' 9"'$  (1.753 m)   Body mass index is 30.27 kg/m. Wt Readings from Last 3 Encounters:  05/21/22 205 lb (93 kg)  08/17/20 206 lb (93.4 kg)  04/26/20 206 lb 6.4 oz (93.6 kg)    Physical Exam Vitals reviewed.  Constitutional:      Appearance: Normal appearance.  HENT:     Head: Normocephalic and atraumatic.     Mouth/Throat:     Mouth: Mucous membranes are dry.  Eyes:     Extraocular Movements: Extraocular movements intact.  Cardiovascular:     Rate and Rhythm: Normal rate and regular rhythm.     Pulses: Normal pulses.     Heart sounds: Normal heart sounds.  Pulmonary:     Effort: Pulmonary effort is normal.     Breath sounds: Normal breath sounds.  Abdominal:     Palpations: Abdomen is soft.     Tenderness: There is no abdominal tenderness.  Musculoskeletal:     Cervical back: No tenderness.  Lymphadenopathy:     Cervical: No cervical adenopathy.  Skin:    General: Skin is warm and dry.     Capillary Refill: Capillary refill takes less than 2 seconds.   Neurological:     General: No focal deficit present.     Mental Status: He is alert and oriented to person, place, and time.  Psychiatric:        Mood and Affect: Mood normal.        Behavior: Behavior normal.    Results for orders placed or performed in visit on 05/21/22 (from the past 24 hour(s))  POCT glycosylated hemoglobin (Hb A1C)     Status: Abnormal   Collection Time: 05/21/22  8:41 AM  Result Value Ref Range   Hemoglobin A1C 6.4 (A) 4.0 - 5.6 %   HbA1c POC (<> result, manual entry)     HbA1c, POC (prediabetic range)     HbA1c, POC (controlled diabetic range)        ASSESSMENT & PLAN: A total of 45 minutes was spent with the patient and counseling/coordination of care regarding preparing for this visit, review of most recent office visit notes, review of multiple chronic medical problems and their management, review of all medications and changes made, review of most recent blood work results including interpretation of today's hemoglobin A1c, education on nutrition, differential diagnosis of dysphagia and need for upper endoscopy, need for orthopedic evaluation of lower back and left hip, review of health maintenance items, cardiovascular risks associated with hypertension and diabetes, prognosis, documentation, and need for follow-up.  Problem List Items Addressed This Visit       Cardiovascular and Mediastinum   Hypertension associated with diabetes ([place]) - Primary    Well-controlled hypertension. Continue lisinopril 40 mg daily and amlodipine 5 mg daily. BP Readings from Last 3 Encounters:  05/21/22 136/76  08/17/20 136/80  04/26/20 (!) 153/81  Well-controlled diabetes with hemoglobin A1c of 6.4 Continue  metformin 500 mg twice a day and glipizide 5 mg daily. Recommend to start Farxiga 10 mg daily Cardiovascular risks associated with hypertension and diabetes discussed Diet and nutrition discussed Follow-up in 6 months.       Relevant Medications   carvedilol  (COREG) 25 MG tablet   amLODipine (NORVASC) 5 MG tablet   glipiZIDE (GLUCOTROL) 5 MG tablet   lisinopril (ZESTRIL) 40 MG tablet   metFORMIN (GLUCOPHAGE) 500 MG tablet   rosuvastatin (CRESTOR) 20 MG tablet   dapagliflozin propanediol (FARXIGA) 10 MG TABS tablet   Other Relevant Orders   Urine Microalbumin w/creat. ratio   CBC with Differential/Platelet   Comprehensive metabolic panel   POCT glycosylated hemoglobin (Hb A1C) (Completed)   Ambulatory referral to Ophthalmology   Coronary atherosclerosis    Stable.  No anginal episodes. Continue daily baby aspirin and rosuvastatin 20 mg daily. Continue carvedilol 25 mg twice a day. No recent cardiology evaluation History of MI in the past      Relevant Medications   carvedilol (COREG) 25 MG tablet   amLODipine (NORVASC) 5 MG tablet   lisinopril (ZESTRIL) 40 MG tablet   rosuvastatin (CRESTOR) 20 MG tablet   Ischemic cardiomyopathy    Stable.  No recent anginal episodes. Continue carvedilol 25 mg twice a day and daily baby aspirin      Relevant Medications   carvedilol (COREG) 25 MG tablet   amLODipine (NORVASC) 5 MG tablet   lisinopril (ZESTRIL) 40 MG tablet   rosuvastatin (CRESTOR) 20 MG tablet   dapagliflozin propanediol (FARXIGA) 10 MG TABS tablet     Digestive   Esophageal dysphagia    Mostly to solids.  No dysphagia to liquids. Not loosing weight.  Good appetite. No hematemesis or melena. Non-smoker.  Drinks small amount of vodka daily No family history of esophageal or stomach cancer Needs upper endoscopy for evaluation GI referral placed today.      Relevant Orders   Ambulatory referral to Gastroenterology     Endocrine   Dyslipidemia associated with type 2 diabetes mellitus ([place])    Stable.  Diet and nutrition discussed. Continue rosuvastatin 20 mg daily. Lipid profile done today.      Relevant Medications   glipiZIDE (GLUCOTROL) 5 MG tablet   lisinopril (ZESTRIL) 40 MG tablet   metFORMIN (GLUCOPHAGE)  500 MG tablet   rosuvastatin (CRESTOR) 20 MG tablet   dapagliflozin propanediol (FARXIGA) 10 MG TABS tablet   Other Relevant Orders   CBC with Differential/Platelet   Comprehensive metabolic panel   Lipid panel   POCT glycosylated hemoglobin (Hb A1C) (Completed)     Other   Chronic bilateral low back pain without sciatica    Chronic and affecting quality of life. Needs orthopedic evaluation. Sports medicine referral placed today.      Relevant Orders   Ambulatory referral to Sports Medicine   Chronic left hip pain    Chronic and affecting quality of life. Needs orthopedic evaluation. Referral to sports medicine placed today.      Relevant Orders   Ambulatory referral to Sports Medicine   Other Visit Diagnoses     Essential hypertension       Relevant Medications   carvedilol (COREG) 25 MG tablet   amLODipine (NORVASC) 5 MG tablet   lisinopril (ZESTRIL) 40 MG tablet   rosuvastatin (CRESTOR) 20 MG tablet   Type 2 diabetes mellitus with hyperglycemia, without long-term current use of insulin ([facility])       Relevant Medications   glipiZIDE (GLUCOTROL)  5 MG tablet   lisinopril (ZESTRIL) 40 MG tablet   metFORMIN (GLUCOPHAGE) 500 MG tablet   rosuvastatin (CRESTOR) 20 MG tablet   dapagliflozin propanediol (FARXIGA) 10 MG TABS tablet      Patient Instructions  Health Maintenance After Age 103 After age 23, you are at a higher risk for certain long-term diseases and infections as well as injuries from falls. Falls are a major cause of broken bones and head injuries in people who are older than age 55. Getting regular preventive care can help to keep you healthy and well. Preventive care includes getting regular testing and making lifestyle changes as recommended by your health care provider. Talk with your health care provider about: Which screenings and tests you should have. A screening is a test that checks for a disease when you have no symptoms. A diet and exercise plan that  is right for you. What should I know about screenings and tests to prevent falls? Screening and testing are the best ways to find a health problem early. Early diagnosis and treatment give you the best chance of managing medical conditions that are common after age 2. Certain conditions and lifestyle choices may make you more likely to have a fall. Your health care provider may recommend: Regular vision checks. Poor vision and conditions such as cataracts can make you more likely to have a fall. If you wear glasses, make sure to get your prescription updated if your vision changes. Medicine review. Work with your health care provider to regularly review all of the medicines you are taking, including over-the-counter medicines. Ask your health care provider about any side effects that may make you more likely to have a fall. Tell your health care provider if any medicines that you take make you feel dizzy or sleepy. Strength and balance checks. Your health care provider may recommend certain tests to check your strength and balance while standing, walking, or changing positions. Foot health exam. Foot pain and numbness, as well as not wearing proper footwear, can make you more likely to have a fall. Screenings, including: Osteoporosis screening. Osteoporosis is a condition that causes the bones to get weaker and break more easily. Blood pressure screening. Blood pressure changes and medicines to control blood pressure can make you feel dizzy. Depression screening. You may be more likely to have a fall if you have a fear of falling, feel depressed, or feel unable to do activities that you used to do. Alcohol use screening. Using too much alcohol can affect your balance and may make you more likely to have a fall. Follow these instructions at home: Lifestyle Do not drink alcohol if: Your health care provider tells you not to drink. If you drink alcohol: Limit how much you have to: 0-1 drink a day for  women. 0-2 drinks a day for men. Know how much alcohol is in your drink. In the [place] one drink equals one 12 oz bottle of beer (355 mL), one 5 oz glass of wine (148 mL), or one 1 oz glass of hard liquor (44 mL). Do not use any products that contain nicotine or tobacco. These products include cigarettes, chewing tobacco, and vaping devices, such as e-cigarettes. If you need help quitting, ask your health care provider. Activity  Follow a regular exercise program to stay fit. This will help you maintain your balance. Ask your health care provider what types of exercise are appropriate for you. If you need a cane or walker, use it as recommended  by your health care provider. Wear supportive shoes that have nonskid soles. Safety  Remove any tripping hazards, such as rugs, cords, and clutter. Install safety equipment such as grab bars in bathrooms and safety rails on stairs. Keep rooms and walkways well-lit. General instructions Talk with your health care provider about your risks for falling. Tell your health care provider if: You fall. Be sure to tell your health care provider about all falls, even ones that seem minor. You feel dizzy, tiredness (fatigue), or off-balance. Take over-the-counter and prescription medicines only as told by your health care provider. These include supplements. Eat a healthy diet and maintain a healthy weight. A healthy diet includes low-fat dairy products, low-fat (lean) meats, and fiber from whole grains, beans, and lots of fruits and vegetables. Stay current with your vaccines. Schedule regular health, dental, and eye exams. Summary Having a healthy lifestyle and getting preventive care can help to protect your health and wellness after age 17. Screening and testing are the best way to find a health problem early and help you avoid having a fall. Early diagnosis and treatment give you the best chance for managing medical conditions that are more common for people  who are older than age 52. Falls are a major cause of broken bones and head injuries in people who are older than age 41. Take precautions to prevent a fall at home. Work with your health care provider to learn what changes you can make to improve your health and wellness and to prevent falls. This information is not intended to replace advice given to you by your health care provider. Make sure you discuss any questions you have with your health care provider. Document Revised: 11/06/2020 Document Reviewed: 11/06/2020 Elsevier Patient Education  [place], MD [facility]

## 2022-05-21 NOTE — Assessment & Plan Note (Signed)
Stable.  Diet and nutrition discussed. Continue rosuvastatin 20 mg daily. Lipid profile done today.

## 2022-05-21 NOTE — Assessment & Plan Note (Signed)
Mostly to solids.  No dysphagia to liquids. Not loosing weight.  Good appetite. No hematemesis or melena. Non-smoker.  Drinks small amount of vodka daily No family history of esophageal or stomach cancer Needs upper endoscopy for evaluation GI referral placed today.

## 2022-05-21 NOTE — Assessment & Plan Note (Signed)
Stable.  No anginal episodes. Continue daily baby aspirin and rosuvastatin 20 mg daily. Continue carvedilol 25 mg twice a day. No recent cardiology evaluation History of MI in the past

## 2022-05-21 NOTE — Patient Instructions (Signed)
Health Maintenance After Age 69 After age 69, you are at a higher risk for certain long-term diseases and infections as well as injuries from falls. Falls are a major cause of broken bones and head injuries in people who are older than age 69. Getting regular preventive care can help to keep you healthy and well. Preventive care includes getting regular testing and making lifestyle changes as recommended by your health care provider. Talk with your health care provider about: Which screenings and tests you should have. A screening is a test that checks for a disease when you have no symptoms. A diet and exercise plan that is right for you. What should I know about screenings and tests to prevent falls? Screening and testing are the best ways to find a health problem early. Early diagnosis and treatment give you the best chance of managing medical conditions that are common after age 69. Certain conditions and lifestyle choices may make you more likely to have a fall. Your health care provider may recommend: Regular vision checks. Poor vision and conditions such as cataracts can make you more likely to have a fall. If you wear glasses, make sure to get your prescription updated if your vision changes. Medicine review. Work with your health care provider to regularly review all of the medicines you are taking, including over-the-counter medicines. Ask your health care provider about any side effects that may make you more likely to have a fall. Tell your health care provider if any medicines that you take make you feel dizzy or sleepy. Strength and balance checks. Your health care provider may recommend certain tests to check your strength and balance while standing, walking, or changing positions. Foot health exam. Foot pain and numbness, as well as not wearing proper footwear, can make you more likely to have a fall. Screenings, including: Osteoporosis screening. Osteoporosis is a condition that causes  the bones to get weaker and break more easily. Blood pressure screening. Blood pressure changes and medicines to control blood pressure can make you feel dizzy. Depression screening. You may be more likely to have a fall if you have a fear of falling, feel depressed, or feel unable to do activities that you used to do. Alcohol use screening. Using too much alcohol can affect your balance and may make you more likely to have a fall. Follow these instructions at home: Lifestyle Do not drink alcohol if: Your health care provider tells you not to drink. If you drink alcohol: Limit how much you have to: 0-1 drink a day for women. 0-2 drinks a day for men. Know how much alcohol is in your drink. In the [place] one drink equals one 12 oz bottle of beer (355 mL), one 5 oz glass of wine (148 mL), or one 1 oz glass of hard liquor (44 mL). Do not use any products that contain nicotine or tobacco. These products include cigarettes, chewing tobacco, and vaping devices, such as e-cigarettes. If you need help quitting, ask your health care provider. Activity  Follow a regular exercise program to stay fit. This will help you maintain your balance. Ask your health care provider what types of exercise are appropriate for you. If you need a cane or walker, use it as recommended by your health care provider. Wear supportive shoes that have nonskid soles. Safety  Remove any tripping hazards, such as rugs, cords, and clutter. Install safety equipment such as grab bars in bathrooms and safety rails on stairs. Keep rooms and walkways   well-lit. General instructions Talk with your health care provider about your risks for falling. Tell your health care provider if: You fall. Be sure to tell your health care provider about all falls, even ones that seem minor. You feel dizzy, tiredness (fatigue), or off-balance. Take over-the-counter and prescription medicines only as told by your health care provider. These include  supplements. Eat a healthy diet and maintain a healthy weight. A healthy diet includes low-fat dairy products, low-fat (lean) meats, and fiber from whole grains, beans, and lots of fruits and vegetables. Stay current with your vaccines. Schedule regular health, dental, and eye exams. Summary Having a healthy lifestyle and getting preventive care can help to protect your health and wellness after age 69. Screening and testing are the best way to find a health problem early and help you avoid having a fall. Early diagnosis and treatment give you the best chance for managing medical conditions that are more common for people who are older than age 69. Falls are a major cause of broken bones and head injuries in people who are older than age 69. Take precautions to prevent a fall at home. Work with your health care provider to learn what changes you can make to improve your health and wellness and to prevent falls. This information is not intended to replace advice given to you by your health care provider. Make sure you discuss any questions you have with your health care provider. Document Revised: 11/06/2020 Document Reviewed: 11/06/2020 Elsevier Patient Education  2023 Elsevier Inc.  

## 2022-05-21 NOTE — Assessment & Plan Note (Signed)
Chronic and affecting quality of life. Needs orthopedic evaluation. Referral to sports medicine placed today.

## 2022-05-21 NOTE — Assessment & Plan Note (Signed)
Chronic and affecting quality of life. Needs orthopedic evaluation. Sports medicine referral placed today.

## 2022-05-21 NOTE — Assessment & Plan Note (Signed)
Stable.  No recent anginal episodes. Continue carvedilol 25 mg twice a day and daily baby aspirin
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
## 2022-05-27 ENCOUNTER — Ambulatory Visit (INDEPENDENT_AMBULATORY_CARE_PROVIDER_SITE_OTHER): Payer: Medicare Other

## 2022-05-27 ENCOUNTER — Ambulatory Visit: Payer: Medicare Other | Admitting: Sports Medicine

## 2022-05-27 VITALS — BP 130/86 | HR 63 | Ht 69.0 in | Wt 208.0 lb

## 2022-05-27 DIAGNOSIS — M545 Low back pain, unspecified: Secondary | ICD-10-CM | POA: Diagnosis not present

## 2022-05-27 DIAGNOSIS — M25552 Pain in left hip: Secondary | ICD-10-CM | POA: Diagnosis not present

## 2022-05-27 DIAGNOSIS — M47816 Spondylosis without myelopathy or radiculopathy, lumbar region: Secondary | ICD-10-CM | POA: Diagnosis not present

## 2022-05-27 DIAGNOSIS — G8929 Other chronic pain: Secondary | ICD-10-CM | POA: Diagnosis not present

## 2022-05-27 MED ORDER — MELOXICAM 15 MG PO TABS: 15.0000 mg | ORAL_TABLET | Freq: Every day | ORAL | 0 refills | Status: DC

## 2022-05-27 NOTE — Patient Instructions (Addendum)
Good to see you  - Start meloxicam 15 mg daily x2 weeks.  If still having pain after 2 weeks, complete 3rd-week of meloxicam. May use remaining meloxicam as needed once daily for pain control.  Do not to use additional NSAIDs while taking meloxicam.  May use Tylenol [phone] mg 2 to 3 times a day for breakthrough pain. Low back HEP Pt referral  3-4 week follow up

## 2022-06-05 ENCOUNTER — Other Ambulatory Visit: Payer: Self-pay | Admitting: Emergency Medicine

## 2022-06-05 ENCOUNTER — Ambulatory Visit: Payer: Medicare Other | Admitting: Physical Therapy

## 2022-06-05 DIAGNOSIS — E1159 Type 2 diabetes mellitus with other circulatory complications: Secondary | ICD-10-CM

## 2022-06-05 DIAGNOSIS — I10 Essential (primary) hypertension: Secondary | ICD-10-CM

## 2022-06-06 NOTE — Therapy (Signed)
OUTPATIENT PHYSICAL THERAPY THORACOLUMBAR EVALUATION   Patient Name: Scott Dodson MRN: [id number] DOB:10-May-1951, 71 y.o., male Today's Date: 06/08/2022  END OF SESSION:  PT End of Session - 06/07/22 0856     Visit Number 1    Number of Visits 7    Date for PT Re-Evaluation 07/19/22    Authorization Type [employer] MEDICARE    Authorization - Number of Visits 27    Progress Note Due on Visit 10    PT Start Time 0850    PT Stop Time 0935    PT Time Calculation (min) 45 min    Activity Tolerance Patient tolerated treatment well    Behavior During Therapy [facility] for tasks assessed/performed             Past Medical History:  Diagnosis Date   Allergy    Asthma    Coronary artery disease    s/p lateral MI 4 /11, s/p Promus DES OM2   Diabetes mellitus without complication ([place])    Heart murmur    Hyperlipidemia    Hypertension    Myocardial infarct ([place]) 2011   Past Surgical History:  Procedure Laterality Date   ADENOIDECTOMY     CORONARY ARTERY BYPASS GRAFT  2011   CORONARY STENT PLACEMENT     TONSILLECTOMY     TYMPANIC MEMBRANE REPAIR Right 2004   VASECTOMY     Patient Active Problem List   Diagnosis Date Noted   Ischemic cardiomyopathy 05/21/2022   Esophageal dysphagia 05/21/2022   Chronic bilateral low back pain without sciatica 05/21/2022   Chronic left hip pain 05/21/2022   Age-related nuclear cataract of both eyes 02/05/2021   Vitreomacular adhesion of left eye 12/13/2019   Severe nonproliferative diabetic retinopathy of right eye without macular edema associated with type 2 diabetes mellitus ([place]) 12/13/2019   Severe nonproliferative diabetic retinopathy of left eye, with macular edema, associated with type 2 diabetes mellitus ([place]) 12/13/2019   Vitreomacular adhesion of right eye 12/13/2019   History of diabetes mellitus 02/09/2018   CARDIOMYOPATHY, ISCHEMIC 05/18/2010   Coronary atherosclerosis 10/11/2009   Dyslipidemia associated with type 2 diabetes  mellitus ([place]) 10/09/2009   HYPERLIPIDEMIA 10/09/2009   Hypertension associated with diabetes ([place]) 10/09/2009   MYOCARDIAL INFARCTION, HX OF 10/09/2009    PCP: Horald Pollen, MD   REFERRING PROVIDER: Glennon Mac, DO   REFERRING DIAG: [phone] (ICD-10-CM) - Chronic bilateral low back pain without sciatica   Rationale for Evaluation and Treatment: Rehabilitation  THERAPY DIAG:  Other low back pain  Muscle weakness (generalized)  ONSET DATE: Low back pain is chronic since his 20's and the L hip for 6 months  SUBJECTIVE:  SUBJECTIVE STATEMENT: Pt reports a chronic Hx of low back pain since his 20's and the L hip for 6 months. Pt notes the R leg is shorter than the L. A heel lift and orthotics have helped is LBP. Pt is not able to recall a particular MOI re: his L hip. Pt reports the L hip is feeling better with the meloxicam prescription he has been taking for approx 1.5 weeks. Prior to this, he was as not able to lie on his L side sleeping, but now is able.  PERTINENT HISTORY:  High BMI; CAD  PAIN:  Are you having pain? Yes: NPRS scale: 1/10 Pain location: Low back Pain description: Throb, intermittent half the time Aggravating factors: Bending Relieving factors: Meloxicam, heat  PRECAUTIONS: None  WEIGHT BEARING RESTRICTIONS: No  FALLS:  Has patient fallen in last 6 months? No  LIVING ENVIRONMENT: Lives with: lives with their family Lives in: House/apartment  OCCUPATION: Driver for a [employer]  PLOF: Independent  PATIENT GOALS: Exercises that can be helpful  NEXT MD VISIT:   OBJECTIVE:   DIAGNOSTIC FINDINGS:  Xray 05/27/22 FINDINGS: There is no evidence of hip fracture or dislocation. There is no evidence of arthropathy or other focal bone  abnormality.   IMPRESSION: Negative.  FINDINGS: There is no evidence of hip fracture or dislocation. There is no evidence of arthropathy or other focal bone abnormality.   IMPRESSION: Negative.  PATIENT SURVEYS:  FOTO: Perceived low back function 50%, predicted 70%; L hip  function 57%, predicted 73%   SCREENING FOR RED FLAGS: Bowel or bladder incontinence: No Spinal tumors: No Cauda equina syndrome: No Compression fracture: No  COGNITION: Overall cognitive status: Within functional limits for tasks assessed     SENSATION: WFL  MUSCLE LENGTH: Hamstrings: Right tight; Left tight Thomas test: Right NT; Left NT  POSTURE: No Significant postural limitations  PALPATION: Not TTP  LUMBAR ROM:   AROM eval  Flexion Mod limited, decreased reversal of lordosis, tight hamstrings  Extension Full  Right lateral flexion Min limited, pulling L low back  Left lateral flexion Min limited, puliing R low back  Right rotation Full  Left rotation Full   (Blank rows = not tested)  LOWER EXTREMITY ROM:     Passive  Right eval Left eval  Hip flexion    Hip extension    Hip abduction    Hip adduction    Hip internal rotation decreased decreased  Hip external rotation    Knee flexion    Knee extension    Ankle dorsiflexion    Ankle plantarflexion    Ankle inversion    Ankle eversion     (Blank rows = not tested)  LOWER EXTREMITY MMT:     Myotome screen negative, bilat strength bilat. Weak abdominals MMT Right eval Left eval  Hip flexion    Hip extension    Hip abduction    Hip adduction    Hip internal rotation    Hip external rotation    Knee flexion    Knee extension    Ankle dorsiflexion    Ankle plantarflexion    Ankle inversion    Ankle eversion     (Blank rows = not tested)  LUMBAR SPECIAL TESTS:  Straight leg raise test: Negative, Slump test: Negative, and bridge tolerance WNLs at 60"  FUNCTIONAL TESTS:  NT  GAIT: Distance walked:  200' Assistive device utilized: None Level of assistance: Complete Independence Comments: Out toeing, decreased pelvic rotation  TODAY'S TREATMENT:  [facility] Adult PT Treatment:                                                DATE: 06/07/22 Therapeutic Exercise: Developed, instructed in, and pt completed therex as noted in HEP    PATIENT EDUCATION:  Education details: Eval findings, POC, HEP  Person educated: Patient Education method: Explanation, Demonstration, Tactile cues, Verbal cues, and Handouts Education comprehension: verbalized understanding, returned demonstration, verbal cues required, and tactile cues required  HOME EXERCISE PROGRAM: Access Code: [place] URL: [email or web address] Date: 06/07/2022 Prepared by: Gar Ponto  Exercises - Supine Bridge  - 1 x daily - 7 x weekly - 1 sets - 10 reps - 5 hold - Supine Posterior Pelvic Tilt  - 1 x daily - 7 x weekly - 1 sets - 10 reps - 3 hold - Hooklying Hamstring Stretch with Strap  - 1 x daily - 7 x weekly - 1 sets - 3 reps - 20 hold - Supine Piriformis Stretch with Foot on Ground  - 1 x daily - 7 x weekly - 1 sets - 3 reps - 20 hold - Supine Lower Trunk Rotation  - 1 x daily - 7 x weekly - 1 sets - 3 reps - 20 hold  ASSESSMENT:  CLINICAL IMPRESSION: Patient is a 71 y.o. male who was seen today for physical therapy evaluation and treatment for M54.50,G89.29 (ICD-10-CM) - Chronic bilateral low back pain without sciatica. Additionally, pt was experiencing L hip pain which has improved c a prescription of meloxicam. Pt resents with decreased lumbar ROM and decrease abdominal strength. Pt will benefit from skilled PT to address impairments for improved function with less low back pain.  OBJECTIVE IMPAIRMENTS: decreased activity tolerance, decreased strength, pain, and high BMI .   ACTIVITY  LIMITATIONS: carrying, lifting, bending, and sitting  PARTICIPATION LIMITATIONS: cleaning, laundry, driving, occupation, and yard work  PERSONAL FACTORS: Age, Fitness, Past/current experiences, Time since onset of injury/illness/exacerbation, and 1 comorbidity: High BMI  are also affecting patient's functional outcome.   REHAB POTENTIAL: Good  CLINICAL DECISION MAKING: Stable/uncomplicated  EVALUATION COMPLEXITY: Low   GOALS:  SHORT TERM GOALS=LTGs   LONG TERM GOALS: Target date: 07/19/22  Pt will be Ind in a final HEP to maintain achieved LOF Baseline: initiated Goal status: INITIAL  2.  Pt will voice understanding of measures to assist in pain reduction  Baseline:  Goal status: INITIAL  3.  Improve trunk flexion to min limited for improved lumbar mobility and function Baseline: Mod limited Goal status: INITIAL  4.  Pt will demonstrate improved core strength by completing a plank from his knees for 30" Baseline:  Goal status: INITIAL  5.  Pt's FOTO score will improved to the predicted value of 73% for L hip and 70% for low back as indication of improved function  Baseline: 57% L hip and 50% low back Goal status: INITIAL  PLAN:  PT FREQUENCY: 1x/week  PT DURATION: 6 weeks  PLANNED INTERVENTIONS: Therapeutic exercises, Therapeutic activity, Balance training, Patient/Family education, Self Care, Joint mobilization, Aquatic Therapy, Dry Needling, Electrical stimulation, Spinal manipulation, Spinal mobilization, Cryotherapy, Moist heat, Taping, Traction, Ultrasound, Ionotophoresis '4mg'$ /ml Dexamethasone, Manual therapy, and Re-evaluation.  PLAN FOR NEXT SESSION: Review FOTO; assess response to HEP; progress therex as indicated; use of modalities, manual therapy; and TPDN as indicated.  Bristol Osentoski MS, PT 06/08/22 3:23  PM   

## 2022-06-07 ENCOUNTER — Ambulatory Visit: Payer: Medicare Other | Attending: Sports Medicine

## 2022-06-07 ENCOUNTER — Other Ambulatory Visit: Payer: Self-pay

## 2022-06-07 DIAGNOSIS — M6281 Muscle weakness (generalized): Secondary | ICD-10-CM | POA: Diagnosis not present

## 2022-06-07 DIAGNOSIS — G8929 Other chronic pain: Secondary | ICD-10-CM | POA: Diagnosis not present

## 2022-06-07 DIAGNOSIS — M545 Low back pain, unspecified: Secondary | ICD-10-CM | POA: Diagnosis not present

## 2022-06-07 DIAGNOSIS — M5459 Other low back pain: Secondary | ICD-10-CM | POA: Insufficient documentation
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
## 2022-06-16 ENCOUNTER — Other Ambulatory Visit: Payer: Self-pay | Admitting: Sports Medicine

## 2022-06-17 ENCOUNTER — Ambulatory Visit: Payer: Medicare Other | Admitting: Sports Medicine

## 2022-06-19 ENCOUNTER — Ambulatory Visit
Admission: EM | Admit: 2022-06-19 | Discharge: 2022-06-19 | Disposition: A | Discharge status: Home / Self Care | Payer: Medicare Other | Attending: Emergency Medicine | Admitting: Emergency Medicine

## 2022-06-19 ENCOUNTER — Ambulatory Visit: Payer: Medicare Other

## 2022-06-19 ENCOUNTER — Ambulatory Visit (INDEPENDENT_AMBULATORY_CARE_PROVIDER_SITE_OTHER): Payer: Medicare Other

## 2022-06-19 DIAGNOSIS — B9789 Other viral agents as the cause of diseases classified elsewhere: Secondary | ICD-10-CM | POA: Diagnosis not present

## 2022-06-19 DIAGNOSIS — J4521 Mild intermittent asthma with (acute) exacerbation: Secondary | ICD-10-CM

## 2022-06-19 DIAGNOSIS — J988 Other specified respiratory disorders: Secondary | ICD-10-CM

## 2022-06-19 DIAGNOSIS — Z87891 Personal history of nicotine dependence: Secondary | ICD-10-CM

## 2022-06-19 DIAGNOSIS — R109 Unspecified abdominal pain: Secondary | ICD-10-CM | POA: Diagnosis not present

## 2022-06-19 DIAGNOSIS — R197 Diarrhea, unspecified: Secondary | ICD-10-CM | POA: Diagnosis not present

## 2022-06-19 DIAGNOSIS — R059 Cough, unspecified: Secondary | ICD-10-CM | POA: Diagnosis not present

## 2022-06-19 DIAGNOSIS — R079 Chest pain, unspecified: Secondary | ICD-10-CM | POA: Diagnosis not present

## 2022-06-19 MED ORDER — CETIRIZINE HCL 10 MG PO TABS: 10.0000 mg | ORAL_TABLET | Freq: Every day | ORAL | 1 refills | Status: DC

## 2022-06-19 MED ORDER — COMBIVENT RESPIMAT 20-100 MCG/ACT IN AERS: 1.0000 | INHALATION_SPRAY | Freq: Four times a day (QID) | RESPIRATORY_TRACT | 0 refills | Status: DC

## 2022-06-19 MED ORDER — FLUTICASONE PROPIONATE 50 MCG/ACT NA SUSP: 1.0000 | Freq: Every day | NASAL | 2 refills | Status: DC

## 2022-06-19 MED ORDER — IPRATROPIUM-ALBUTEROL 0.5-2.5 (3) MG/3ML IN SOLN
3.0000 mL | Freq: Once | RESPIRATORY_TRACT | Status: AC
  Administered 2022-06-19: 3 mL via RESPIRATORY_TRACT

## 2022-06-19 MED ORDER — PROMETHAZINE-DM 6.25-15 MG/5ML PO SYRP: 5.0000 mL | ORAL_SOLUTION | Freq: Four times a day (QID) | ORAL | 0 refills | Status: DC | PRN

## 2022-06-19 MED ORDER — METHYLPREDNISOLONE 8 MG PO TABS: 16.0000 mg | ORAL_TABLET | Freq: Every day | ORAL | 0 refills | Status: AC

## 2022-06-19 MED ORDER — METHYLPREDNISOLONE SODIUM SUCC 125 MG IJ SOLR
80.0000 mg | Freq: Once | INTRAMUSCULAR | Status: AC
  Administered 2022-06-19: 80 mg via INTRAMUSCULAR

## 2022-06-19 NOTE — Discharge Instructions (Addendum)
I am happy to inform you that your chest x-ray today is normal no concern for acute bronchitis or pneumonia.  Your EKG did not reveal any full heart causes of your shortness of breath either.  At this time, I believe that you have acquired a viral respiratory infection which has flared up your known asthma.  Due to the duration of your symptoms, I do not believe that viral testing is indicated because it would not change what we are going to do today to help you feel better.  I am happy that you had improvement of your work of breathing after the DuoNeb treatment today.  I recommend that using a similar medication until your symptoms have fully resolved.  Please read below to learn more about the medications, dosages and frequencies that I recommend to help alleviate your symptoms and to get you feeling better soon:   Solu-Medrol IM (methylprednisolone):  To quickly address your significant respiratory inflammation, you were provided with an injection of Solu-Medrol in the office today.  You should continue to feel the full benefit of the steroid for the next 4 to 6 hours.    Medrol (methylprednisolone): This is a steroid that will significantly calm your upper and lower airways, please take the daily recommended quantity of tablets daily with your breakfast meal starting tomorrow morning until the prescription is complete.      Zyrtec (cetirizine): This is an excellent second-generation antihistamine that helps to reduce respiratory inflammatory response to environmental allergens.  In some patients, this medication can cause daytime sleepiness so I recommend that you take 1 tablet daily at bedtime.    Flonase (fluticasone): This is a steroid nasal spray that you use once daily, 1 spray in each nare.  This medication does not work well if you decide to use it only used as you feel you need to, it works best used on a daily basis.  After 3 to 5 days of use, you will notice significant reduction of the  inflammation and mucus production that is currently being caused by exposure to allergens, whether seasonal or environmental.  The most common side effect of this medication is nosebleeds.  If you experience a nosebleed, please discontinue use for 1 week, then feel free to resume.  I have provided you with a prescription.     Combivent, DuoNeb (ipratropium and albuterol): This inhaled medication contains a short acting beta agonist bronchodilator and a muscarinic bronchodilator.  This medication works on the smooth muscle that opens and constricts of your airways by relaxing the muscle.  The result of relaxation of the smooth muscle is increased air movement and improved work of breathing.  This is a short acting medication that can be used every 4-6 hours as needed for increased work of breathing, shortness of breath, wheezing and excessive coughing.      Promethazine DM: Promethazine is both a nasal decongestant and an antinausea medication that makes most patients feel fairly sleepy.  The DM is dextromethorphan, a cough suppressant found in many over-the-counter cough medications.  Please take 5 mL before bedtime to minimize your cough which will help you sleep better.  I have sent a prescription for this medication to your pharmacy.   Please follow-up within the next 5-7 days either with your primary care provider or urgent care if your symptoms do not resolve.  If you do not have a primary care provider, we will assist you in finding one.  Thank you for visiting urgent care today.  We appreciate the opportunity to participate in your care.

## 2022-06-19 NOTE — ED Notes (Signed)
EKG results given to provider.

## 2022-06-19 NOTE — ED Triage Notes (Signed)
Pt c/o chest congestion, abd pain, productive cough, lack of appetite, SHOB, diarrhea, lack of energy.    Started: Sunday   Home interventions: theraflu

## 2022-06-19 NOTE — ED Provider Notes (Signed)
[facility]    CSN: [id number] Arrival date & time: 06/19/22  1122    HISTORY   Chief Complaint  Patient presents with   Nasal Congestion   Cough   Shortness of Breath   HPI Scott Dodson is a pleasant, 71 y.o. male who presents to urgent care today. Patient complains of a 3-day history of chest congestion, abdominal pain, diarrhea, cough productive of sputum, loss of appetite, shortness of breath and malaise.  Patient states he has been taking TheraFlu without meaningful relief of his symptoms.  Patient has a history of type 2 diabetes which is well-controlled at this time, essential hypertension which is not well-controlled at this time, myocardial infarction, allergies, asthma.  Patient has an O2 sat of 90% on arrival with a heart rate of 57, blood pressure is elevated at 152/72.  The history is provided by the patient.   Past Medical History:  Diagnosis Date   Allergy    Asthma    Coronary artery disease    s/p lateral MI 4 /11, s/p Promus DES OM2   Diabetes mellitus without complication ([place])    Heart murmur    Hyperlipidemia    Hypertension    Myocardial infarct [facility]) 2011   Patient Active Problem List   Diagnosis Date Noted   Ischemic cardiomyopathy 05/21/2022   Esophageal dysphagia 05/21/2022   Chronic bilateral low back pain without sciatica 05/21/2022   Chronic left hip pain 05/21/2022   Age-related nuclear cataract of both eyes 02/05/2021   Vitreomacular adhesion of left eye 12/13/2019   Severe nonproliferative diabetic retinopathy of right eye without macular edema associated with type 2 diabetes mellitus ([place]) 12/13/2019   Severe nonproliferative diabetic retinopathy of left eye, with macular edema, associated with type 2 diabetes mellitus ([place]) 12/13/2019   Vitreomacular adhesion of right eye 12/13/2019   History of diabetes mellitus 02/09/2018   CARDIOMYOPATHY, ISCHEMIC 05/18/2010   Coronary atherosclerosis 10/11/2009   Dyslipidemia  associated with type 2 diabetes mellitus ([place]) 10/09/2009   HYPERLIPIDEMIA 10/09/2009   Hypertension associated with diabetes ([place]) 10/09/2009   MYOCARDIAL INFARCTION, HX OF 10/09/2009   Past Surgical History:  Procedure Laterality Date   ADENOIDECTOMY     CORONARY ARTERY BYPASS GRAFT  2011   CORONARY STENT PLACEMENT     TONSILLECTOMY     TYMPANIC MEMBRANE REPAIR Right 2004   VASECTOMY      Home Medications    Prior to Admission medications   Medication Sig Start Date End Date Taking? Authorizing Provider  ALBUTEROL IN Inhale into the lungs.    [provider]  amLODipine (NORVASC) 5 MG tablet TAKE 1 TABLET BY MOUTH DAILY 06/05/22   Horald Pollen, MD  Ascorbic Acid (VITAMIN C) 1000 MG tablet Take 2,000 mg by mouth daily.    [provider]  aspirin 81 MG tablet Take 81 mg by mouth daily.    [provider]  carvedilol (COREG) 25 MG tablet Take 1 tablet (25 mg total) by mouth 2 (two) times daily with a meal. TAKE 1 TABLET BY MOUTH  TWICE DAILY WITH MEALS 05/21/22 05/16/23  Horald Pollen, MD  Coenzyme Q10 (CO Q 10 PO) Take by mouth.    [provider]  dapagliflozin propanediol (FARXIGA) 10 MG TABS tablet Take 1 tablet (10 mg total) by mouth daily before breakfast. 05/21/22 05/16/23  Horald Pollen, MD  fluticasone [facility]) 50 MCG/ACT nasal spray Place 2 sprays into both nostrils daily. 10/13/17   Jaynee Eagles,  PA-C  glipiZIDE (GLUCOTROL) 5 MG tablet Take 1 tablet (5 mg total) by mouth daily with breakfast. 05/21/22   Horald Pollen, MD  lisinopril (ZESTRIL) 40 MG tablet Take 1 tablet (40 mg total) by mouth daily. 05/21/22   Horald Pollen, MD  meloxicam (MOBIC) 15 MG tablet Take 1 tablet (15 mg total) by mouth daily. 05/27/22   Glennon Mac, DO  metFORMIN (GLUCOPHAGE) 500 MG tablet Take 1 tablet (500 mg total) by mouth 2 (two) times daily with a meal. 05/21/22   Sagardia, Ines Bloomer, MD  rosuvastatin (CRESTOR)  20 MG tablet Take 1 tablet (20 mg total) by mouth daily. 05/21/22   Horald Pollen, MD    Family History Family History  Problem Relation Age of Onset   Heart disease Mother    Hypertension Mother    Heart disease Father    Hypertension Father    Heart disease Brother    Heart disease Sister    Colon cancer Maternal Grandfather 34   Colon polyps Neg Hx    Esophageal cancer Neg Hx    Stomach cancer Neg Hx    Rectal cancer Neg Hx    Social History Social History   Tobacco Use   Smoking status: Former    Packs/day: 0.50    Years: 5.00    Total pack years: 2.50    Types: Cigarettes    Quit date: 10/14/1974    Years since quitting: 47.7   Smokeless tobacco: Never  Vaping Use   Vaping Use: Never used  Substance Use Topics   Alcohol use: Yes    Alcohol/week: 5.0 standard drinks of alcohol    Types: 5 Standard drinks or equivalent per week   Drug use: No   Allergies   Patient has no known allergies.  Review of Systems Review of Systems Pertinent findings revealed after performing a 14 point review of systems has been noted in the history of present illness.  Physical Exam Triage Vital Signs ED Triage Vitals  Enc Vitals Group     BP 04/27/21 0827 (!) 147/82     Pulse Rate 04/27/21 0827 72     Resp 04/27/21 0827 18     Temp 04/27/21 0827 98.3 F (36.8 C)     Temp Source 04/27/21 0827 Oral     SpO2 04/27/21 0827 98 %     Weight --      Height --      Head Circumference --      Peak Flow --      Pain Score 04/27/21 0826 5     Pain Loc --      Pain Edu? --      Excl. in [place]? --   No data found.  Updated Vital Signs BP (!) 152/72 (BP Location: Right Arm)   Pulse (!) 57   Temp 98.9 F (37.2 C) (Oral)   Resp 14   SpO2 90%   Physical Exam Vitals and nursing note reviewed.  Constitutional:      General: He is not in acute distress.    Appearance: Normal appearance. He is not ill-appearing.  HENT:     Head: Normocephalic and atraumatic.     Salivary  Glands: Right salivary gland is not diffusely enlarged or tender. Left salivary gland is not diffusely enlarged or tender.     Right Ear: Ear canal and external ear normal. No drainage. A middle ear effusion is present. There is no impacted cerumen. Tympanic membrane is  bulging. Tympanic membrane is not injected or erythematous.     Left Ear: Ear canal and external ear normal. No drainage. A middle ear effusion is present. There is no impacted cerumen. Tympanic membrane is bulging. Tympanic membrane is not injected or erythematous.     Ears:     Comments: Bilateral EACs normal, both TMs bulging with clear fluid    Nose: Congestion and rhinorrhea present. No nasal deformity, septal deviation, signs of injury, nasal tenderness or mucosal edema. Rhinorrhea is clear.     Right Nostril: Occlusion present. No foreign body, epistaxis or septal hematoma.     Left Nostril: Occlusion present. No foreign body, epistaxis or septal hematoma.     Right Turbinates: Enlarged and swollen. Not pale.     Left Turbinates: Enlarged and swollen. Not pale.     Right Sinus: No maxillary sinus tenderness or frontal sinus tenderness.     Left Sinus: No maxillary sinus tenderness or frontal sinus tenderness.     Mouth/Throat:     Lips: Pink. No lesions.     Mouth: Mucous membranes are moist. No oral lesions.     Tongue: No lesions. Tongue does not deviate from midline.     Palate: No mass and lesions.     Pharynx: Oropharynx is clear. Uvula midline. No posterior oropharyngeal erythema or uvula swelling.     Tonsils: No tonsillar exudate or tonsillar abscesses. 0 on the right. 0 on the left.     Comments: Postnasal drip Eyes:     General: Lids are normal.        Right eye: No discharge.        Left eye: No discharge.     Extraocular Movements: Extraocular movements intact.     Conjunctiva/sclera: Conjunctivae normal.     Right eye: Right conjunctiva is not injected.     Left eye: Left conjunctiva is not injected.   Neck:     Trachea: Trachea and phonation normal.  Cardiovascular:     Rate and Rhythm: Normal rate and regular rhythm.     Pulses: Normal pulses.     Heart sounds: Normal heart sounds. No murmur heard.    No friction rub. No gallop.  Pulmonary:     Effort: Pulmonary effort is normal. No accessory muscle usage, prolonged expiration or respiratory distress.     Breath sounds: No stridor, decreased air movement or transmitted upper airway sounds. Examination of the right-upper field reveals wheezing. Examination of the left-upper field reveals wheezing. Examination of the right-middle field reveals decreased breath sounds and wheezing. Examination of the left-middle field reveals decreased breath sounds and wheezing. Examination of the right-lower field reveals decreased breath sounds. Examination of the left-lower field reveals decreased breath sounds. Decreased breath sounds and wheezing present. No rhonchi or rales.  Chest:     Chest wall: No tenderness.  Musculoskeletal:        General: Normal range of motion.     Cervical back: Full passive range of motion without pain, normal range of motion and neck supple. Normal range of motion.  Lymphadenopathy:     Cervical: Cervical adenopathy present.     Right cervical: Superficial cervical adenopathy present.     Left cervical: Superficial cervical adenopathy present.  Skin:    General: Skin is warm and dry.     Findings: No erythema or rash.  Neurological:     General: No focal deficit present.     Mental Status: He is alert and oriented to  person, place, and time.  Psychiatric:        Mood and Affect: Mood normal.        Behavior: Behavior normal.     Visual Acuity Right Eye Distance:   Left Eye Distance:   Bilateral Distance:    Right Eye Near:   Left Eye Near:    Bilateral Near:     [facility] Couse / Diagnostics / Procedures:     Radiology DG Chest 2 View  Result Date: 06/19/2022 CLINICAL DATA:  Provided history: Decreased  breath sounds, cough productive of green sputum. Additional history provided: chest congestion, abdominal pain, lack of appetite, shortness of breath, diarrhea, decreased energy. EXAM: CHEST - 2 VIEW COMPARISON:  Prior chest radiographs 10/13/2017 and earlier. FINDINGS: Heart size within normal limits. No appreciable airspace consolidation or pulmonary edema. No evidence of pleural effusion or pneumothorax. Degenerative changes of the spine. IMPRESSION: No evidence of acute cardiopulmonary abnormality. Electronically Signed   By: Kellie Simmering D.O.   On: 06/19/2022 16:15    Procedures Procedures (including critical care time) EKG  Pending results:  Labs Reviewed - No data to display  Medications Ordered in UC: Medications  ipratropium-albuterol (DUONEB) 0.5-2.5 (3) MG/3ML nebulizer solution 3 mL (3 mLs Nebulization Given 06/19/22 1541)  methylPREDNISolone sodium succinate (SOLU-MEDROL) 125 mg/2 mL injection 80 mg (80 mg Intramuscular Given 06/19/22 1541)    UC Diagnoses / Final Clinical Impressions(s)   I have reviewed the triage vital signs and the nursing notes.  Pertinent labs & imaging results that were available during my care of the patient were reviewed by me and considered in my medical decision making (see chart for details).    Final diagnoses:  Mild intermittent asthma with acute exacerbation in adult  Viral respiratory illness  Former smoker, stopped smoking many years ago   Patient advised of EKG and chest x-ray findings.  Patient reported improved work of breathing after DuoNeb treatment and Solu-Medrol injection.  Patient provided with prescription for Combivent, 3 more days of methylprednisolone and allergy medications to help get his respiratory inflammation under control.  Return precautions advised. Please see discharge instructions below for further details of plan of care as provided to patient. ED Prescriptions     Medication Sig Dispense Auth. Provider    Ipratropium-Albuterol (COMBIVENT RESPIMAT) 20-100 MCG/ACT AERS respimat Inhale 1 puff into the lungs every 6 (six) hours. 1 each Lynden Oxford Scales, PA-C   cetirizine (ZYRTEC ALLERGY) 10 MG tablet Take 1 tablet (10 mg total) by mouth at bedtime. 90 tablet Lynden Oxford Scales, PA-C   fluticasone (FLONASE) 50 MCG/ACT nasal spray Place 1 spray into both nostrils daily. Begin by using 2 sprays in each nare daily for 3 to 5 days, then decrease to 1 spray in each nare daily. 15.8 mL Lynden Oxford Scales, PA-C   methylPREDNISolone (MEDROL) 8 MG tablet Take 2 tablets (16 mg total) by mouth daily for 3 days. 6 tablet Lynden Oxford Scales, PA-C   promethazine-dextromethorphan (PROMETHAZINE-DM) 6.25-15 MG/5ML syrup Take 5 mLs by mouth 4 (four) times daily as needed for cough. 60 mL Lynden Oxford Scales, PA-C      PDMP not reviewed this encounter.  Disposition Upon Discharge:  Condition: stable for discharge home Home: take medications as prescribed; routine discharge instructions as discussed; follow up as advised.  Patient presented with an acute illness with associated systemic symptoms and significant discomfort requiring urgent management. In my opinion, this is a condition that a prudent lay person (someone who possesses  an average knowledge of health and medicine) may potentially expect to result in complications if not addressed urgently such as respiratory distress, impairment of bodily function or dysfunction of bodily organs.   Routine symptom specific, illness specific and/or disease specific instructions were discussed with the patient and/or caregiver at length.   As such, the patient has been evaluated and assessed, work-up was performed and treatment was provided in alignment with urgent care protocols and evidence based medicine.  Patient/parent/caregiver has been advised that the patient may require follow up for further testing and treatment if the symptoms continue in spite of  treatment, as clinically indicated and appropriate.  If the patient was tested for COVID-19, Influenza and/or RSV, then the patient/parent/guardian was advised to isolate at home pending the results of his/her diagnostic coronavirus test and potentially longer if they're positive. I have also advised pt that if his/her COVID-19 test returns positive, it's recommended to self-isolate for at least 10 days after symptoms first appeared AND until fever-free for 24 hours without fever reducer AND other symptoms have improved or resolved. Discussed self-isolation recommendations as well as instructions for household member/close contacts as per the [facility] and [place] [facility], and also gave patient the [place] packet with this information.  Patient/parent/caregiver has been advised to return to the [facility] or PCP in 3-5 days if no better; to PCP or the Emergency Department if new signs and symptoms develop, or if the current signs or symptoms continue to change or worsen for further workup, evaluation and treatment as clinically indicated and appropriate  The patient will follow up with their current PCP if and as advised. If the patient does not currently have a PCP we will assist them in obtaining one.   The patient may need specialty follow up if the symptoms continue, in spite of conservative treatment and management, for further workup, evaluation, consultation and treatment as clinically indicated and appropriate.  Patient/parent/caregiver verbalized understanding and agreement of plan as discussed.  All questions were addressed during visit.  Please see discharge instructions below for further details of plan.  Discharge Instructions:   Discharge Instructions      I am happy to inform you that your chest x-ray today is normal no concern for acute bronchitis or pneumonia.  Your EKG did not reveal any full heart causes of your shortness of breath either.  At this time, I believe that you have acquired a viral  respiratory infection which has flared up your known asthma.  Due to the duration of your symptoms, I do not believe that viral testing is indicated because it would not change what we are going to do today to help you feel better.  I am happy that you had improvement of your work of breathing after the DuoNeb treatment today.  I recommend that using a similar medication until your symptoms have fully resolved.  Please read below to learn more about the medications, dosages and frequencies that I recommend to help alleviate your symptoms and to get you feeling better soon:   Solu-Medrol IM (methylprednisolone):  To quickly address your significant respiratory inflammation, you were provided with an injection of Solu-Medrol in the office today.  You should continue to feel the full benefit of the steroid for the next 4 to 6 hours.    Medrol (methylprednisolone): This is a steroid that will significantly calm your upper and lower airways, please take the daily recommended quantity of tablets daily with your breakfast meal starting tomorrow morning until the  prescription is complete.      Zyrtec (cetirizine): This is an excellent second-generation antihistamine that helps to reduce respiratory inflammatory response to environmental allergens.  In some patients, this medication can cause daytime sleepiness so I recommend that you take 1 tablet daily at bedtime.    Flonase (fluticasone): This is a steroid nasal spray that you use once daily, 1 spray in each nare.  This medication does not work well if you decide to use it only used as you feel you need to, it works best used on a daily basis.  After 3 to 5 days of use, you will notice significant reduction of the inflammation and mucus production that is currently being caused by exposure to allergens, whether seasonal or environmental.  The most common side effect of this medication is nosebleeds.  If you experience a nosebleed, please discontinue use for 1  week, then feel free to resume.  I have provided you with a prescription.     Combivent, DuoNeb (ipratropium and albuterol): This inhaled medication contains a short acting beta agonist bronchodilator and a muscarinic bronchodilator.  This medication works on the smooth muscle that opens and constricts of your airways by relaxing the muscle.  The result of relaxation of the smooth muscle is increased air movement and improved work of breathing.  This is a short acting medication that can be used every 4-6 hours as needed for increased work of breathing, shortness of breath, wheezing and excessive coughing.      Promethazine DM: Promethazine is both a nasal decongestant and an antinausea medication that makes most patients feel fairly sleepy.  The DM is dextromethorphan, a cough suppressant found in many over-the-counter cough medications.  Please take 5 mL before bedtime to minimize your cough which will help you sleep better.  I have sent a prescription for this medication to your pharmacy.   Please follow-up within the next 5-7 days either with your primary care provider or urgent care if your symptoms do not resolve.  If you do not have a primary care provider, we will assist you in finding one.        Thank you for visiting urgent care today.  We appreciate the opportunity to participate in your care.       This office note has been dictated using Museum/gallery curator.  Unfortunately, this method of dictation can sometimes lead to typographical or grammatical errors.  I apologize for your inconvenience in advance if this occurs.  Please do not hesitate to reach out to me if clarification is needed.      Lynden Oxford Scales, [place] 06/19/22 [phone]

## 2022-06-28 ENCOUNTER — Encounter (HOSPITAL_COMMUNITY): Payer: Self-pay

## 2022-06-28 ENCOUNTER — Ambulatory Visit (HOSPITAL_COMMUNITY)
Admission: EM | Admit: 2022-06-28 | Discharge: 2022-06-28 | Disposition: A | Discharge status: Other Acute Inpatient Hospital | Payer: Medicare Other | Attending: Nurse Practitioner | Admitting: Nurse Practitioner

## 2022-06-28 ENCOUNTER — Ambulatory Visit (INDEPENDENT_AMBULATORY_CARE_PROVIDER_SITE_OTHER): Payer: Medicare Other

## 2022-06-28 ENCOUNTER — Other Ambulatory Visit: Payer: Self-pay

## 2022-06-28 ENCOUNTER — Emergency Department (HOSPITAL_COMMUNITY): Payer: Medicare Other

## 2022-06-28 ENCOUNTER — Encounter (HOSPITAL_COMMUNITY): Payer: Self-pay | Admitting: Internal Medicine

## 2022-06-28 ENCOUNTER — Ambulatory Visit: Payer: Medicare Other

## 2022-06-28 ENCOUNTER — Inpatient Hospital Stay (HOSPITAL_COMMUNITY)
Admission: EM | Admit: 2022-06-28 | Discharge: 2022-06-30 | DRG: 202 | Disposition: A | Discharge status: Home / Self Care | Payer: Medicare Other | Attending: Internal Medicine | Admitting: Internal Medicine

## 2022-06-28 DIAGNOSIS — T380X5A Adverse effect of glucocorticoids and synthetic analogues, initial encounter: Secondary | ICD-10-CM | POA: Diagnosis present

## 2022-06-28 DIAGNOSIS — Z87891 Personal history of nicotine dependence: Secondary | ICD-10-CM

## 2022-06-28 DIAGNOSIS — I255 Ischemic cardiomyopathy: Secondary | ICD-10-CM | POA: Diagnosis present

## 2022-06-28 DIAGNOSIS — Z7984 Long term (current) use of oral hypoglycemic drugs: Secondary | ICD-10-CM

## 2022-06-28 DIAGNOSIS — R062 Wheezing: Secondary | ICD-10-CM | POA: Diagnosis not present

## 2022-06-28 DIAGNOSIS — Z7982 Long term (current) use of aspirin: Secondary | ICD-10-CM | POA: Diagnosis not present

## 2022-06-28 DIAGNOSIS — Z800 Family history of malignant neoplasm of digestive organs: Secondary | ICD-10-CM | POA: Diagnosis not present

## 2022-06-28 DIAGNOSIS — Z1152 Encounter for screening for COVID-19: Secondary | ICD-10-CM | POA: Diagnosis not present

## 2022-06-28 DIAGNOSIS — J45901 Unspecified asthma with (acute) exacerbation: Secondary | ICD-10-CM

## 2022-06-28 DIAGNOSIS — R079 Chest pain, unspecified: Secondary | ICD-10-CM

## 2022-06-28 DIAGNOSIS — J069 Acute upper respiratory infection, unspecified: Secondary | ICD-10-CM

## 2022-06-28 DIAGNOSIS — I10 Essential (primary) hypertension: Secondary | ICD-10-CM | POA: Insufficient documentation

## 2022-06-28 DIAGNOSIS — Z951 Presence of aortocoronary bypass graft: Secondary | ICD-10-CM

## 2022-06-28 DIAGNOSIS — Z8249 Family history of ischemic heart disease and other diseases of the circulatory system: Secondary | ICD-10-CM | POA: Diagnosis not present

## 2022-06-28 DIAGNOSIS — R0602 Shortness of breath: Secondary | ICD-10-CM

## 2022-06-28 DIAGNOSIS — R059 Cough, unspecified: Secondary | ICD-10-CM

## 2022-06-28 DIAGNOSIS — R7989 Other specified abnormal findings of blood chemistry: Secondary | ICD-10-CM

## 2022-06-28 DIAGNOSIS — J9601 Acute respiratory failure with hypoxia: Secondary | ICD-10-CM | POA: Diagnosis present

## 2022-06-28 DIAGNOSIS — I2510 Atherosclerotic heart disease of native coronary artery without angina pectoris: Secondary | ICD-10-CM | POA: Diagnosis not present

## 2022-06-28 DIAGNOSIS — E1165 Type 2 diabetes mellitus with hyperglycemia: Secondary | ICD-10-CM | POA: Diagnosis not present

## 2022-06-28 DIAGNOSIS — B9789 Other viral agents as the cause of diseases classified elsewhere: Secondary | ICD-10-CM | POA: Diagnosis not present

## 2022-06-28 DIAGNOSIS — N179 Acute kidney failure, unspecified: Secondary | ICD-10-CM | POA: Diagnosis not present

## 2022-06-28 DIAGNOSIS — R0902 Hypoxemia: Secondary | ICD-10-CM | POA: Diagnosis not present

## 2022-06-28 DIAGNOSIS — E785 Hyperlipidemia, unspecified: Secondary | ICD-10-CM | POA: Diagnosis not present

## 2022-06-28 DIAGNOSIS — M5459 Other low back pain: Secondary | ICD-10-CM

## 2022-06-28 DIAGNOSIS — D72829 Elevated white blood cell count, unspecified: Secondary | ICD-10-CM | POA: Diagnosis not present

## 2022-06-28 DIAGNOSIS — J4541 Moderate persistent asthma with (acute) exacerbation: Secondary | ICD-10-CM | POA: Diagnosis not present

## 2022-06-28 DIAGNOSIS — R0600 Dyspnea, unspecified: Secondary | ICD-10-CM

## 2022-06-28 DIAGNOSIS — Z8639 Personal history of other endocrine, nutritional and metabolic disease: Secondary | ICD-10-CM

## 2022-06-28 DIAGNOSIS — J4521 Mild intermittent asthma with (acute) exacerbation: Secondary | ICD-10-CM | POA: Diagnosis not present

## 2022-06-28 DIAGNOSIS — R0789 Other chest pain: Secondary | ICD-10-CM | POA: Diagnosis not present

## 2022-06-28 DIAGNOSIS — M6281 Muscle weakness (generalized): Secondary | ICD-10-CM

## 2022-06-28 DIAGNOSIS — Z79899 Other long term (current) drug therapy: Secondary | ICD-10-CM | POA: Diagnosis not present

## 2022-06-28 LAB — COMPREHENSIVE METABOLIC PANEL
ALT: 17 U/L (ref 0–44)
AST: 18 U/L (ref 15–41)
Albumin: 3.8 g/dL (ref 3.5–5.0)
Alkaline Phosphatase: 61 U/L (ref 38–126)
Anion gap: 14 (ref 5–15)
BUN: 15 mg/dL (ref 8–23)
CO2: 22 mmol/L (ref 22–32)
Calcium: 9.2 mg/dL (ref 8.9–10.3)
Chloride: 101 mmol/L (ref 98–111)
Creatinine, Ser: 1.33 mg/dL — ABNORMAL HIGH (ref 0.61–1.24)
GFR, Estimated: 58 mL/min — ABNORMAL LOW (ref >60)
Glucose, Bld: 148 mg/dL — ABNORMAL HIGH (ref 70–99)
Potassium: 4 mmol/L (ref 3.5–5.1)
Sodium: 137 mmol/L (ref 135–145)
Total Bilirubin: 1.5 mg/dL — ABNORMAL HIGH (ref 0.3–1.2)
Total Protein: 7.4 g/dL (ref 6.5–8.1)

## 2022-06-28 LAB — CBC
HCT: 48.2 % (ref 39.0–52.0)
Hemoglobin: 16.5 g/dL (ref 13.0–17.0)
MCH: 29.4 pg (ref 26.0–34.0)
MCHC: 34.2 g/dL (ref 30.0–36.0)
MCV: 85.9 fL (ref 80.0–100.0)
Platelets: 255 10*3/uL (ref 150–400)
RBC: 5.61 MIL/uL (ref 4.22–5.81)
RDW: 12.5 % (ref 11.5–15.5)
WBC: 19.5 10*3/uL — ABNORMAL HIGH (ref 4.0–10.5)
nRBC: 0 % (ref 0.0–0.2)

## 2022-06-28 LAB — GLUCOSE, CAPILLARY
Glucose-Capillary: 159 mg/dL — ABNORMAL HIGH (ref 70–99)
Glucose-Capillary: 242 mg/dL — ABNORMAL HIGH (ref 70–99)

## 2022-06-28 LAB — RESP PANEL BY RT-PCR (RSV, FLU A&B, COVID)  RVPGX2
Influenza A by PCR: NEGATIVE
Influenza B by PCR: NEGATIVE
Resp Syncytial Virus by PCR: NEGATIVE
SARS Coronavirus 2 by RT PCR: NEGATIVE

## 2022-06-28 LAB — HEMOGLOBIN A1C
Hgb A1c MFr Bld: 6.4 % — ABNORMAL HIGH (ref 4.8–5.6)
Mean Plasma Glucose: 136.98 mg/dL

## 2022-06-28 LAB — LACTIC ACID, PLASMA: Lactic Acid, Venous: 1.7 mmol/L (ref 0.5–1.9)

## 2022-06-28 LAB — TROPONIN I (HIGH SENSITIVITY): Troponin I (High Sensitivity): 34 ng/L — ABNORMAL HIGH (ref <18)

## 2022-06-28 MED ORDER — AZITHROMYCIN 250 MG PO TABS
500.0000 mg | ORAL_TABLET | Freq: Every day | ORAL | Status: DC
  Administered 2022-06-28 – 2022-06-30 (×3): 500 mg via ORAL
  Filled 2022-06-28 (×2): qty 2
  Filled 2022-06-28 (×2): qty 1

## 2022-06-28 MED ORDER — ASPIRIN 81 MG PO TBEC
81.0000 mg | DELAYED_RELEASE_TABLET | Freq: Every day | ORAL | Status: DC
  Administered 2022-06-28 – 2022-06-30 (×3): 81 mg via ORAL
  Filled 2022-06-28 (×3): qty 1

## 2022-06-28 MED ORDER — POLYETHYLENE GLYCOL 3350 17 G PO PACK: 17.0000 g | PACK | Freq: Every day | ORAL | Status: DC | PRN

## 2022-06-28 MED ORDER — IPRATROPIUM BROMIDE 0.02 % IN SOLN
0.5000 mg | Freq: Once | RESPIRATORY_TRACT | Status: AC
  Administered 2022-06-28: 0.5 mg via RESPIRATORY_TRACT
  Filled 2022-06-28: qty 2.5

## 2022-06-28 MED ORDER — FLUTICASONE PROPIONATE 50 MCG/ACT NA SUSP
1.0000 | Freq: Every day | NASAL | Status: DC
  Administered 2022-06-29 – 2022-06-30 (×2): 1 via NASAL
  Filled 2022-06-28: qty 16

## 2022-06-28 MED ORDER — DEXAMETHASONE SODIUM PHOSPHATE 10 MG/ML IJ SOLN
10.0000 mg | Freq: Once | INTRAMUSCULAR | Status: AC
  Administered 2022-06-28: 10 mg via INTRAMUSCULAR

## 2022-06-28 MED ORDER — METHYLPREDNISOLONE SODIUM SUCC 125 MG IJ SOLR
80.0000 mg | Freq: Every day | INTRAMUSCULAR | Status: DC
  Administered 2022-06-29 – 2022-06-30 (×2): 80 mg via INTRAVENOUS
  Filled 2022-06-28 (×3): qty 2

## 2022-06-28 MED ORDER — IPRATROPIUM-ALBUTEROL 0.5-2.5 (3) MG/3ML IN SOLN
3.0000 mL | Freq: Four times a day (QID) | RESPIRATORY_TRACT | Status: DC
  Administered 2022-06-29 (×3): 3 mL via RESPIRATORY_TRACT
  Filled 2022-06-28 (×4): qty 3

## 2022-06-28 MED ORDER — AMLODIPINE BESYLATE 5 MG PO TABS: 5.0000 mg | ORAL_TABLET | Freq: Every day | ORAL | Status: DC

## 2022-06-28 MED ORDER — SODIUM CHLORIDE 0.9 % IV SOLN: INTRAVENOUS | Status: DC

## 2022-06-28 MED ORDER — ROSUVASTATIN CALCIUM 20 MG PO TABS
20.0000 mg | ORAL_TABLET | Freq: Every day | ORAL | Status: DC
  Administered 2022-06-29 – 2022-06-30 (×2): 20 mg via ORAL
  Filled 2022-06-28 (×2): qty 1

## 2022-06-28 MED ORDER — ENOXAPARIN SODIUM 40 MG/0.4ML IJ SOSY
40.0000 mg | PREFILLED_SYRINGE | INTRAMUSCULAR | Status: DC
  Administered 2022-06-28 – 2022-06-29 (×2): 40 mg via SUBCUTANEOUS
  Filled 2022-06-28 (×2): qty 0.4

## 2022-06-28 MED ORDER — ALBUTEROL SULFATE (2.5 MG/3ML) 0.083% IN NEBU
2.5000 mg | INHALATION_SOLUTION | Freq: Once | RESPIRATORY_TRACT | Status: AC
  Administered 2022-06-28: 2.5 mg via RESPIRATORY_TRACT

## 2022-06-28 MED ORDER — DEXAMETHASONE SODIUM PHOSPHATE 10 MG/ML IJ SOLN
INTRAMUSCULAR | Status: AC
  Filled 2022-06-28: qty 1

## 2022-06-28 MED ORDER — GUAIFENESIN-DM 100-10 MG/5ML PO SYRP: 10.0000 mL | ORAL_SOLUTION | Freq: Four times a day (QID) | ORAL | Status: DC | PRN

## 2022-06-28 MED ORDER — ALBUTEROL SULFATE (2.5 MG/3ML) 0.083% IN NEBU
INHALATION_SOLUTION | RESPIRATORY_TRACT | Status: AC
  Filled 2022-06-28: qty 3

## 2022-06-28 MED ORDER — INSULIN ASPART 100 UNIT/ML IJ SOLN
0.0000 [IU] | Freq: Three times a day (TID) | INTRAMUSCULAR | Status: DC
  Administered 2022-06-28 – 2022-06-29 (×2): 3 [IU] via SUBCUTANEOUS
  Administered 2022-06-29: 11 [IU] via SUBCUTANEOUS
  Administered 2022-06-29: 5 [IU] via SUBCUTANEOUS
  Administered 2022-06-30: 3 [IU] via SUBCUTANEOUS
  Administered 2022-06-30: 15 [IU] via SUBCUTANEOUS

## 2022-06-28 MED ORDER — METHYLPREDNISOLONE SODIUM SUCC 125 MG IJ SOLR
125.0000 mg | Freq: Once | INTRAMUSCULAR | Status: AC
  Administered 2022-06-28: 125 mg via INTRAVENOUS
  Filled 2022-06-28: qty 2

## 2022-06-28 MED ORDER — ALBUTEROL SULFATE (2.5 MG/3ML) 0.083% IN NEBU
5.0000 mg | INHALATION_SOLUTION | Freq: Once | RESPIRATORY_TRACT | Status: AC
  Administered 2022-06-28: 5 mg via RESPIRATORY_TRACT

## 2022-06-28 MED ORDER — ALBUTEROL SULFATE (2.5 MG/3ML) 0.083% IN NEBU
5.0000 mg | INHALATION_SOLUTION | Freq: Once | RESPIRATORY_TRACT | Status: DC
  Filled 2022-06-28: qty 6

## 2022-06-28 NOTE — H&P (Signed)
History and Physical    Alamin Mccuiston KKX:[id number] DOB: 1950-09-29 DOA: 06/28/2022  PCP: Horald Pollen, MD   Patient coming from: Home    Chief Complaint: Shortness of breath, cough, congestion  HPI: Scott Dodson is a 71 y.o. male with medical history significant of hypertension, diabetes type 2, hyperlipidemia, coronary artery disease s/p PCI, mild intermittent asthma who presented from home with complaints of 2 weeks history of chest tightness, shortness of breath, nonproductive cough, wheezing.  Patient has been diagnosed with asthma in the past  but does not get frequent asthma attacks.  Last attack was on 1993.He regularly checks his oxygenation at home and found that it does drop to the range of 88%. He is a past smoker. He progressively felt worse over the last few days with worsening shortness of breath and cough.  He was also seen at [facility] urgent care at [facility] on 12/20, at that time chest x-ray was normal.  He was given a steroid injection and nebulizing treatment in the clinic and was sent home with Combivent, Flonase, 3 days  course of steroids. This did not help , he progressively got short of breath and presented here today. On presentation, he was hemodynamically stable, he was found to be saturating in the range of 80s on room air.  He was found to be wheezing. Patient seen and examined at bedside today.  Hemodynamically stable during my evaluation.  He was on 3 L of oxygen maintaining saturation.  No history of fever, chest pain, palpitations, abdomen pain, nausea, vomiting, diarrhea, hematochezia or melena.He lives with his wife, no problem with ambulation.  No other members of the family are sick at home  ED Course: Remained hemodynamically stable in the ED.  Lab work showed creatinine of 1.33.  WC count of 19.5.  Chest x-ray did not show any pneumonia or pleural effusion.  COVID/flu swab was taken in the emergency department which is pending.  Patient was  given nebulizing treatment.  Review of Systems: As per HPI otherwise 10 point review of systems negative.    Past Medical History:  Diagnosis Date   Allergy    Asthma    Coronary artery disease    s/p lateral MI 4 /11, s/p Promus DES OM2   Diabetes mellitus without complication ([place])    Heart murmur    Hyperlipidemia    Hypertension    Myocardial infarct [facility]) 2011    Past Surgical History:  Procedure Laterality Date   ADENOIDECTOMY     CORONARY ARTERY BYPASS GRAFT  2011   CORONARY STENT PLACEMENT     TONSILLECTOMY     TYMPANIC MEMBRANE REPAIR Right 2004   VASECTOMY       reports that he quit smoking about 47 years ago. His smoking use included cigarettes. He has a 2.50 pack-year smoking history. He has never used smokeless tobacco. He reports current alcohol use of about 5.0 standard drinks of alcohol per week. He reports that he does not use drugs.  No Known Allergies  Family History  Problem Relation Age of Onset   Heart disease Mother    Hypertension Mother    Heart disease Father    Hypertension Father    Heart disease Brother    Heart disease Sister    Colon cancer Maternal Grandfather 92   Colon polyps Neg Hx    Esophageal cancer Neg Hx    Stomach cancer Neg Hx    Rectal cancer Neg Hx  Prior to Admission medications   Medication Sig Start Date End Date Taking? Authorizing Provider  amLODipine (NORVASC) 5 MG tablet TAKE 1 TABLET BY MOUTH DAILY 06/05/22   Horald Pollen, MD  Ascorbic Acid (VITAMIN C) 1000 MG tablet Take 2,000 mg by mouth daily.    [provider]  aspirin 81 MG tablet Take 81 mg by mouth daily.    [provider]  carvedilol (COREG) 25 MG tablet Take 1 tablet (25 mg total) by mouth 2 (two) times daily with a meal. TAKE 1 TABLET BY MOUTH  TWICE DAILY WITH MEALS 05/21/22 05/16/23  Horald Pollen, MD  cetirizine (ZYRTEC ALLERGY) 10 MG tablet Take 1 tablet (10 mg total) by mouth at bedtime. 06/19/22 12/16/22   Lynden Oxford Scales, PA-C  Coenzyme Q10 (CO Q 10 PO) Take by mouth.    [provider]  dapagliflozin propanediol (FARXIGA) 10 MG TABS tablet Take 1 tablet (10 mg total) by mouth daily before breakfast. 05/21/22 05/16/23  Horald Pollen, MD  fluticasone [facility]) 50 MCG/ACT nasal spray Place 1 spray into both nostrils daily. Begin by using 2 sprays in each nare daily for 3 to 5 days, then decrease to 1 spray in each nare daily. 06/19/22   Lynden Oxford Scales, PA-C  glipiZIDE (GLUCOTROL) 5 MG tablet Take 1 tablet (5 mg total) by mouth daily with breakfast. 05/21/22   Horald Pollen, MD  Ipratropium-Albuterol (COMBIVENT RESPIMAT) 20-100 MCG/ACT AERS respimat Inhale 1 puff into the lungs every 6 (six) hours. 06/19/22 07/19/22  Lynden Oxford Scales, PA-C  lisinopril (ZESTRIL) 40 MG tablet Take 1 tablet (40 mg total) by mouth daily. 05/21/22   Horald Pollen, MD  meloxicam (MOBIC) 15 MG tablet Take 1 tablet (15 mg total) by mouth daily. 05/27/22   Glennon Mac, DO  metFORMIN (GLUCOPHAGE) 500 MG tablet Take 1 tablet (500 mg total) by mouth 2 (two) times daily with a meal. 05/21/22   Sagardia, Ines Bloomer, MD  promethazine-dextromethorphan (PROMETHAZINE-DM) 6.25-15 MG/5ML syrup Take 5 mLs by mouth 4 (four) times daily as needed for cough. 06/19/22   Lynden Oxford Scales, PA-C  rosuvastatin (CRESTOR) 20 MG tablet Take 1 tablet (20 mg total) by mouth daily. 05/21/22   Horald Pollen, MD    Physical Exam: Vitals:   06/28/22 1345 06/28/22 1400 06/28/22 1445 06/28/22 1500  BP: (!) 141/74 132/89 117/69 131/68  Pulse: 71 72 67 70  Resp: 18 (!) 22 19 (!) 24  SpO2: 99% 99% 100% 99%    Constitutional: Calm, comfortable, pleasant gentleman Vitals:   06/28/22 1345 06/28/22 1400 06/28/22 1445 06/28/22 1500  BP: (!) 141/74 132/89 117/69 131/68  Pulse: 71 72 67 70  Resp: 18 (!) 22 19 (!) 24  SpO2: 99% 99% 100% 99%   Eyes: PERRL, lids and conjunctivae  normal ENMT: Mucous membranes are moist.  Neck: normal, supple, no masses, no thyromegaly Respiratory: Bilateral expiratory wheezes. Normal respiratory effort. No accessory muscle use.  Cardiovascular: Regular rate and rhythm, no murmurs / rubs / gallops. No extremity edema.  Abdomen: no tenderness, no masses palpated. No hepatosplenomegaly. Bowel sounds positive.  Musculoskeletal: no clubbing / cyanosis. No joint deformity upper and lower extremities.  Skin: no rashes, lesions, ulcers. No induration Neurologic: CN 2-12 grossly intact.  Strength 5/5 in all 4.  Psychiatric: Normal judgment and insight. Alert and oriented x 3. Normal mood.   Foley Catheter:None  Labs on Admission: I have personally reviewed following labs and imaging studies  CBC: Recent Labs  Lab 06/28/22 1234  WBC 19.5*  HGB 16.5  HCT 48.2  MCV 85.9  PLT 891   Basic Metabolic Panel: Recent Labs  Lab 06/28/22 1234  NA 137  K 4.0  CL 101  CO2 22  GLUCOSE 148*  BUN 15  CREATININE 1.33*  CALCIUM 9.2   GFR: CrCl cannot be calculated (Unknown ideal weight.). Liver Function Tests: Recent Labs  Lab 06/28/22 1234  AST 18  ALT 17  ALKPHOS 61  BILITOT 1.5*  PROT 7.4  ALBUMIN 3.8   No results for input(s): "LIPASE", "AMYLASE" in the last 168 hours. No results for input(s): "AMMONIA" in the last 168 hours. Coagulation Profile: No results for input(s): "INR", "PROTIME" in the last 168 hours. Cardiac Enzymes: No results for input(s): "CKTOTAL", "CKMB", "CKMBINDEX", "TROPONINI" in the last 168 hours. BNP (last 3 results) No results for input(s): "PROBNP" in the last 8760 hours. HbA1C: No results for input(s): "HGBA1C" in the last 72 hours. CBG: No results for input(s): "GLUCAP" in the last 168 hours. Lipid Profile: No results for input(s): "CHOL", "HDL", "LDLCALC", "TRIG", "CHOLHDL", "LDLDIRECT" in the last 72 hours. Thyroid Function Tests: No results for input(s): "TSH", "T4TOTAL", "FREET4",  "T3FREE", "THYROIDAB" in the last 72 hours. Anemia Panel: No results for input(s): "VITAMINB12", "FOLATE", "FERRITIN", "TIBC", "IRON", "RETICCTPCT" in the last 72 hours. Urine analysis:    Component Value Date/Time   COLORURINE Yellow 04/18/2010 0908   APPEARANCEUR CLEAR 04/18/2010 0908   LABSPEC <=1.005 04/18/2010 0908   PHURINE 6.0 04/18/2010 0908   GLUCOSEU NEGATIVE 04/18/2010 0908   BILIRUBINUR NEGATIVE 04/18/2010 0908   KETONESUR NEGATIVE 04/18/2010 0908   UROBILINOGEN 0.2 04/18/2010 0908   NITRITE NEGATIVE 04/18/2010 0908   LEUKOCYTESUR NEGATIVE 04/18/2010 0908    Radiological Exams on Admission: DG Chest Port 1 View  Result Date: 06/28/2022 CLINICAL DATA:  Shortness of breath, cough EXAM: PORTABLE CHEST 1 VIEW COMPARISON:  Portable exam 1245 hours compared to 06/28/2022 at 1047 hours FINDINGS: Normal heart size, mediastinal contours, and pulmonary vascularity. Lungs clear. No pulmonary infiltrate, pleural effusion, or pneumothorax. Osseous structures unremarkable. IMPRESSION: No acute abnormalities. Electronically Signed   By: Lavonia Dana M.D.   On: 06/28/2022 13:10   DG Chest 2 View  Result Date: 06/28/2022 CLINICAL DATA:  Shortness of breath, cough, chest pain, wheezing, chest tightness, and runny nose for 2 weeks EXAM: CHEST - 2 VIEW COMPARISON:  06/19/2022 FINDINGS: Normal heart size, mediastinal contours, and pulmonary vascularity. Chronic increased markings in the lower lungs. No acute infiltrate, pleural effusion, or pneumothorax. Osseous structures unremarkable. IMPRESSION: No acute abnormalities. Electronically Signed   By: Lavonia Dana M.D.   On: 06/28/2022 10:56     Assessment/Plan Principal Problem:   Acute asthma exacerbation Active Problems:   History of diabetes mellitus   Ischemic cardiomyopathy   HLD (hyperlipidemia)   HTN (hypertension)   Acute asthma exacerbation: History of mild intermittent asthma.  Having congestion, cough, upper respiratory  symptoms, wheezing at home. Continue Solu-Medrol for now.  Continue bronchodilators, mucolytics. Will also  start on azithromycin.  Acute hypoxic respiratory failure:Secondary to asthma exacerbation.  Does not use oxygen at home.  Currently on 3 L of oxygen.  Will continue to wean.  COVID/flu swab pending  Leukocytosis: Unclear etiology.  Chest x-ray without pneumonia.  Recent history of steroids intake.  Started on azithromycin here for asthma exacerbation to cover for bronchitis.  AKI: Baseline creatinine normal.  Start on gentle IV fluids check BMP tomorrow  History of diabetes type 2: Takes metformin, glipizide, Farxiga at home.  Currently on sliding scale.  Monitor blood sugars  Hypertension: Currently normotensive.  Takes lisinopril, Coreg at home.  Currently on hold.  Will resume when appropriate.  History of coronary artery disease: Takes aspirin which has been resumed.  Denies any anginal symptoms.  Status post PCI several years ago..        Severity of Illness: The appropriate patient status for this patient is OBSERVATION.  \ DVT prophylaxis: Lovenox Code Status: Full code Family Communication: None at the bedside Consults called: None     Shelly Coss MD [facility]  06/28/2022, 3:32 PM

## 2022-06-28 NOTE — ED Provider Notes (Signed)
[facility] EMERGENCY DEPARTMENT Provider Note   CSN: [id number] Arrival date & time: 06/28/22  1213     History  Chief Complaint  Patient presents with   Shortness of Breath    Scott Dodson is a 71 y.o. male.  Pt with hx asthma, c/o non prod cough, congestion, increased wheezing, chest tightness and sob in the past 2 weeks. Symptoms acute onset, moderate, persistent, worse in past 1-2 days. Recently completed a few day course of prednisone. Denies recent abx use. No recent known ill contacts. No fever or chills. Denies leg pain or swelling. No pleuritic pain. Non smoker.   The history is provided by the patient, medical records and the EMS personnel.  Shortness of Breath Associated symptoms: cough and wheezing   Associated symptoms: no abdominal pain, no fever, no headaches, no neck pain, no rash, no sore throat and no vomiting        Home Medications Prior to Admission medications   Medication Sig Start Date End Date Taking? Authorizing Provider  amLODipine (NORVASC) 5 MG tablet TAKE 1 TABLET BY MOUTH DAILY 06/05/22   Horald Pollen, MD  Ascorbic Acid (VITAMIN C) 1000 MG tablet Take 2,000 mg by mouth daily.    [provider]  aspirin 81 MG tablet Take 81 mg by mouth daily.    [provider]  carvedilol (COREG) 25 MG tablet Take 1 tablet (25 mg total) by mouth 2 (two) times daily with a meal. TAKE 1 TABLET BY MOUTH  TWICE DAILY WITH MEALS 05/21/22 05/16/23  Horald Pollen, MD  cetirizine (ZYRTEC ALLERGY) 10 MG tablet Take 1 tablet (10 mg total) by mouth at bedtime. 06/19/22 12/16/22  Lynden Oxford Scales, PA-C  Coenzyme Q10 (CO Q 10 PO) Take by mouth.    [provider]  dapagliflozin propanediol (FARXIGA) 10 MG TABS tablet Take 1 tablet (10 mg total) by mouth daily before breakfast. 05/21/22 05/16/23  Horald Pollen, MD  fluticasone [facility]) 50 MCG/ACT nasal spray Place 1 spray into both nostrils daily.  Begin by using 2 sprays in each nare daily for 3 to 5 days, then decrease to 1 spray in each nare daily. 06/19/22   Lynden Oxford Scales, PA-C  glipiZIDE (GLUCOTROL) 5 MG tablet Take 1 tablet (5 mg total) by mouth daily with breakfast. 05/21/22   Horald Pollen, MD  Ipratropium-Albuterol (COMBIVENT RESPIMAT) 20-100 MCG/ACT AERS respimat Inhale 1 puff into the lungs every 6 (six) hours. 06/19/22 07/19/22  Lynden Oxford Scales, PA-C  lisinopril (ZESTRIL) 40 MG tablet Take 1 tablet (40 mg total) by mouth daily. 05/21/22   Horald Pollen, MD  meloxicam (MOBIC) 15 MG tablet Take 1 tablet (15 mg total) by mouth daily. 05/27/22   Glennon Mac, DO  metFORMIN (GLUCOPHAGE) 500 MG tablet Take 1 tablet (500 mg total) by mouth 2 (two) times daily with a meal. 05/21/22   Sagardia, Ines Bloomer, MD  promethazine-dextromethorphan (PROMETHAZINE-DM) 6.25-15 MG/5ML syrup Take 5 mLs by mouth 4 (four) times daily as needed for cough. 06/19/22   Lynden Oxford Scales, PA-C  rosuvastatin (CRESTOR) 20 MG tablet Take 1 tablet (20 mg total) by mouth daily. 05/21/22   Horald Pollen, MD      Allergies    Patient has no known allergies.    Review of Systems   Review of Systems  Constitutional:  Negative for fever.  HENT:  Positive for congestion. Negative for sore throat.   Eyes:  Negative for redness.  Respiratory:  Positive for cough, shortness of breath and wheezing.   Cardiovascular:  Negative for palpitations and leg swelling.  Gastrointestinal:  Negative for abdominal pain and vomiting.  Genitourinary:  Negative for flank pain.  Musculoskeletal:  Negative for back pain, neck pain and neck stiffness.  Skin:  Negative for rash.  Neurological:  Negative for headaches.  Hematological:  Does not bruise/bleed easily.  Psychiatric/Behavioral:  Negative for confusion.     Physical Exam Updated Vital Signs There were no vitals taken for this visit. Physical Exam Vitals and nursing note  reviewed.  Constitutional:      Appearance: Normal appearance. He is well-developed.  HENT:     Head: Atraumatic.     Nose: Nose normal.     Mouth/Throat:     Mouth: Mucous membranes are moist.     Pharynx: Oropharynx is clear.  Eyes:     General: No scleral icterus.    Conjunctiva/sclera: Conjunctivae normal.     Pupils: Pupils are equal, round, and reactive to light.  Neck:     Trachea: No tracheal deviation.     Comments: No stiffness or rigidity.  Cardiovascular:     Rate and Rhythm: Normal rate and regular rhythm.     Pulses: Normal pulses.     Heart sounds: Normal heart sounds. No murmur heard.    No friction rub. No gallop.  Pulmonary:     Effort: Pulmonary effort is normal. No accessory muscle usage or respiratory distress.     Breath sounds: Wheezing present.  Abdominal:     General: Bowel sounds are normal. There is no distension.     Palpations: Abdomen is soft.     Tenderness: There is no abdominal tenderness.  Genitourinary:    Comments: No cva tenderness. Musculoskeletal:        General: No swelling or tenderness.     Cervical back: Normal range of motion and neck supple. No rigidity.     Right lower leg: No edema.     Left lower leg: No edema.  Skin:    General: Skin is warm and dry.     Findings: No rash. Lesion: .[name]. Neurological:     Mental Status: He is alert.     Comments: Alert, speech clear.   Psychiatric:        Mood and Affect: Mood normal.     ED Results / Procedures / Treatments   Labs (all labs ordered are listed, but only abnormal results are displayed) Results for orders placed or performed during the hospital encounter of 06/28/22  CBC  Result Value Ref Range   WBC 19.5 (H) 4.0 - 10.5 K/uL   RBC 5.61 4.22 - 5.81 MIL/uL   Hemoglobin 16.5 13.0 - 17.0 g/dL   HCT 48.2 39.0 - 52.0 %   MCV 85.9 80.0 - 100.0 fL   MCH 29.4 26.0 - 34.0 pg   MCHC 34.2 30.0 - 36.0 g/dL   RDW 12.5 11.5 - 15.5 %   Platelets 255 150 - 400 K/uL   nRBC 0.0  0.0 - 0.2 %  Comprehensive metabolic panel  Result Value Ref Range   Sodium 137 135 - 145 mmol/L   Potassium 4.0 3.5 - 5.1 mmol/L   Chloride 101 98 - 111 mmol/L   CO2 22 22 - 32 mmol/L   Glucose, Bld 148 (H) 70 - 99 mg/dL   BUN 15 8 - 23 mg/dL   Creatinine, Ser 1.33 (H) 0.61 - 1.24 mg/dL  Calcium 9.2 8.9 - 10.3 mg/dL   Total Protein 7.4 6.5 - 8.1 g/dL   Albumin 3.8 3.5 - 5.0 g/dL   AST 18 15 - 41 U/L   ALT 17 0 - 44 U/L   Alkaline Phosphatase 61 38 - 126 U/L   Total Bilirubin 1.5 (H) 0.3 - 1.2 mg/dL   GFR, Estimated 58 (L) >60 mL/min   Anion gap 14 5 - 15  Lactic acid, plasma  Result Value Ref Range   Lactic Acid, Venous 1.7 0.5 - 1.9 mmol/L  Troponin I (High Sensitivity)  Result Value Ref Range   Troponin I (High Sensitivity) 34 (H) <18 ng/L   DG Chest 2 View  Result Date: 06/28/2022 CLINICAL DATA:  Shortness of breath, cough, chest pain, wheezing, chest tightness, and runny nose for 2 weeks EXAM: CHEST - 2 VIEW COMPARISON:  06/19/2022 FINDINGS: Normal heart size, mediastinal contours, and pulmonary vascularity. Chronic increased markings in the lower lungs. No acute infiltrate, pleural effusion, or pneumothorax. Osseous structures unremarkable. IMPRESSION: No acute abnormalities. Electronically Signed   By: Lavonia Dana M.D.   On: 06/28/2022 10:56   DG Chest 2 View  Result Date: 06/19/2022 CLINICAL DATA:  Provided history: Decreased breath sounds, cough productive of green sputum. Additional history provided: chest congestion, abdominal pain, lack of appetite, shortness of breath, diarrhea, decreased energy. EXAM: CHEST - 2 VIEW COMPARISON:  Prior chest radiographs 10/13/2017 and earlier. FINDINGS: Heart size within normal limits. No appreciable airspace consolidation or pulmonary edema. No evidence of pleural effusion or pneumothorax. Degenerative changes of the spine. IMPRESSION: No evidence of acute cardiopulmonary abnormality. Electronically Signed   By: Kellie Simmering D.O.    On: 06/19/2022 16:15    EKG EKG Interpretation  Date/Time:  Friday June 28 2022 12:23:31 EST Ventricular Rate:  90 PR Interval:  164 QRS Duration: 98 QT Interval:  358 QTC Calculation: 438 R Axis:   31 Text Interpretation: Sinus rhythm Premature ventricular complexes Baseline wander Artifact Poor data quality Confirmed by Lajean Saver [phone]) on 06/28/2022 1:04:29 PM  Radiology DG Chest 2 View  Result Date: 06/28/2022 CLINICAL DATA:  Shortness of breath, cough, chest pain, wheezing, chest tightness, and runny nose for 2 weeks EXAM: CHEST - 2 VIEW COMPARISON:  06/19/2022 FINDINGS: Normal heart size, mediastinal contours, and pulmonary vascularity. Chronic increased markings in the lower lungs. No acute infiltrate, pleural effusion, or pneumothorax. Osseous structures unremarkable. IMPRESSION: No acute abnormalities. Electronically Signed   By: Lavonia Dana M.D.   On: 06/28/2022 10:56    Procedures Procedures    Medications Ordered in ED Medications  albuterol (PROVENTIL) (2.5 MG/3ML) 0.083% nebulizer solution 5 mg (has no administration in time range)  ipratropium (ATROVENT) nebulizer solution 0.5 mg (has no administration in time range)  methylPREDNISolone sodium succinate (SOLU-MEDROL) 125 mg/2 mL injection 125 mg (has no administration in time range)    ED Course/ Medical Decision Making/ A&P                           Medical Decision Making Problems Addressed: Elevated troponin: acute illness or injury Leukocytosis, unspecified type: acute illness or injury Moderate persistent asthma with exacerbation: acute illness or injury with systemic symptoms that poses a threat to life or bodily functions Viral URI with cough: acute illness or injury with systemic symptoms that poses a threat to life or bodily functions  Amount and/or Complexity of Data Reviewed Independent Historian: EMS    Details: hx  External Data Reviewed: notes. Labs: ordered. Decision-making details  documented in ED Course. Radiology: ordered and independent interpretation performed. Decision-making details documented in ED Course. ECG/medicine tests: ordered and independent interpretation performed. Decision-making details documented in ED Course. Discussion of management or test interpretation with external provider(s): medicine  Risk Prescription drug management. Decision regarding hospitalization.   Iv ns. Continuous pulse ox and cardiac monitoring. Labs ordered/sent. Imaging ordered.   Reviewed nursing notes and prior charts for additional history. External reports reviewed. Additional history from: EMS.  Cardiac monitor: sinus rhythm, rate 88.  Labs reviewed/interpreted by me - wbc elev. Lactate normal.   Xrays reviewed/interpreted by me - no pna.   Albuterol and atrovent tx. Solumedrol iv.   Persistent wheezing. Additional albuterol tx.   Given hypoxia (room air pulse ox 86%), wheezing/asthma exacerbation, will admit.   Hospitallists consulted for admission.           Final Clinical Impression(s) / ED Diagnoses Final diagnoses:  None    Rx / DC Orders ED Discharge Orders     None         Lajean Saver, MD 06/28/22 1514

## 2022-06-28 NOTE — ED Triage Notes (Signed)
Pt coming from urgent care, sats in the 80s and nonrebreather applied and sat at 100% Hx of asthma  Alert and oriented  121/79 BP 69 HR 24 RR Solumetrol and neb tx at urgent care, did not help, nurse put him up to 6L with still sating in the 80s, went to non-rebreather and sat increased to 100

## 2022-06-28 NOTE — Therapy (Signed)
OUTPATIENT PHYSICAL THERAPY TREATMENT NOTE   Patient Name: Scott Dodson MRN: [id number] DOB:October 14, 1950, 71 y.o., male Today's Date: 06/28/2022  PCP: Horald Pollen, MD  REFERRING PROVIDER: Glennon Mac, DO   END OF SESSION:   PT End of Session - 06/28/22 0802     Visit Number 2    Number of Visits 7    Date for PT Re-Evaluation 07/19/22    Authorization - Visit Number 1    Authorization - Number of Visits 27    Progress Note Due on Visit 10    PT Start Time 0801    PT Stop Time 0820    PT Time Calculation (min) 19 min    Activity Tolerance Patient tolerated treatment well    Behavior During Therapy [facility] for tasks assessed/performed             Past Medical History:  Diagnosis Date   Allergy    Asthma    Coronary artery disease    s/p lateral MI 4 /11, s/p Promus DES OM2   Diabetes mellitus without complication ([place])    Heart murmur    Hyperlipidemia    Hypertension    Myocardial infarct ([place]) 2011   Past Surgical History:  Procedure Laterality Date   ADENOIDECTOMY     CORONARY ARTERY BYPASS GRAFT  2011   CORONARY [place] Right 2004   VASECTOMY     Patient Active Problem List   Diagnosis Date Noted   Ischemic cardiomyopathy 05/21/2022   Esophageal dysphagia 05/21/2022   Chronic bilateral low back pain without sciatica 05/21/2022   Chronic left hip pain 05/21/2022   Age-related nuclear cataract of both eyes 02/05/2021   Vitreomacular adhesion of left eye 12/13/2019   Severe nonproliferative diabetic retinopathy of right eye without macular edema associated with type 2 diabetes mellitus ([place]) 12/13/2019   Severe nonproliferative diabetic retinopathy of left eye, with macular edema, associated with type 2 diabetes mellitus ([place]) 12/13/2019   Vitreomacular adhesion of right eye 12/13/2019   History of diabetes mellitus 02/09/2018   CARDIOMYOPATHY, ISCHEMIC 05/18/2010   Coronary  atherosclerosis 10/11/2009   Dyslipidemia associated with type 2 diabetes mellitus ([place]) 10/09/2009   HYPERLIPIDEMIA 10/09/2009   Hypertension associated with diabetes ([place]) 10/09/2009   MYOCARDIAL INFARCTION, HX OF 10/09/2009    REFERRING DIAG: M54.50,G89.29 (ICD-10-CM) - Chronic bilateral low back pain without sciatica   THERAPY DIAG:  Other low back pain  Muscle weakness (generalized)  Rationale for Evaluation and Treatment Rehabilitation  SUBJECTIVE:  SUBJECTIVE STATEMENT: Pt reports his low back is doing well, however he has had a cold and he is weak related to that.  PERTINENT HISTORY:  High BMI; CAD   PAIN:  Are you having pain? Yes: NPRS scale: 1/10 Pain location: Low back Pain description: Throb, intermittent half the time Aggravating factors: Bending Relieving factors: Meloxicam, heat   PRECAUTIONS: None   WEIGHT BEARING RESTRICTIONS: No   FALLS:  Has patient fallen in last 6 months? No   LIVING ENVIRONMENT: Lives with: lives with their family Lives in: House/apartment   OCCUPATION: Driver for a [employer]   PLOF: Independent   PATIENT GOALS: Exercises that can be helpful    OBJECTIVE: (objective measures completed at initial evaluation unless otherwise dated)    DIAGNOSTIC FINDINGS:  Xray 05/27/22 FINDINGS: There is no evidence of hip fracture or dislocation. There is no evidence of arthropathy or other focal bone abnormality.   IMPRESSION: Negative.   FINDINGS: There is no evidence of hip fracture or dislocation. There is no evidence of arthropathy or other focal bone abnormality.   IMPRESSION: Negative.   PATIENT SURVEYS:  FOTO: Perceived low back function 50%, predicted 70%; L hip  function 57%, predicted 73%    SCREENING FOR RED FLAGS: Bowel or  bladder incontinence: No Spinal tumors: No Cauda equina syndrome: No Compression fracture: No   COGNITION: Overall cognitive status: Within functional limits for tasks assessed                          SENSATION: WFL   MUSCLE LENGTH: Hamstrings: Right tight; Left tight Thomas test: Right NT; Left NT   POSTURE: No Significant postural limitations   PALPATION: Not TTP   LUMBAR ROM:    AROM eval  Flexion Mod limited, decreased reversal of lordosis, tight hamstrings  Extension Full  Right lateral flexion Min limited, pulling L low back  Left lateral flexion Min limited, puliing R low back  Right rotation Full  Left rotation Full   (Blank rows = not tested)   LOWER EXTREMITY ROM:      Passive  Right eval Left eval  Hip flexion      Hip extension      Hip abduction      Hip adduction      Hip internal rotation decreased decreased  Hip external rotation      Knee flexion      Knee extension      Ankle dorsiflexion      Ankle plantarflexion      Ankle inversion      Ankle eversion       (Blank rows = not tested)   LOWER EXTREMITY MMT:                         Myotome screen negative, bilat strength bilat. Weak abdominals MMT Right eval Left eval  Hip flexion      Hip extension      Hip abduction      Hip adduction      Hip internal rotation      Hip external rotation      Knee flexion      Knee extension      Ankle dorsiflexion      Ankle plantarflexion      Ankle inversion      Ankle eversion       (Blank rows = not tested)   LUMBAR  SPECIAL TESTS:  Straight leg raise test: Negative, Slump test: Negative, and bridge tolerance WNLs at 60"   FUNCTIONAL TESTS:  NT   GAIT: Distance walked: 200' Assistive device utilized: None Level of assistance: Complete Independence Comments: Out toeing, decreased pelvic rotation   TODAY'S TREATMENT:                                                                                                                                [facility] Adult PT Treatment:                                                DATE: 06/28/22 Therapeutic Exercise: Supine Bridge 5 reps - 5 hold Supine Posterior Pelvic Tilt  5 reps - 3 hold Hooklying Hamstring Stretch 1 reps - 20 hold Supine Piriformis Stretch 1 reps - 20 hold Supine Lower Trunk Rotation  5 reps - 5 hold Reviewed HEP and pt completed properly  [facility] Adult PT Treatment:                                                DATE: 06/07/22 Therapeutic Exercise: Developed, instructed in, and pt completed therex as noted in HEP    PATIENT EDUCATION:  Education details: Eval findings, POC, HEP  Person educated: Patient Education method: Explanation, Demonstration, Tactile cues, Verbal cues, and Handouts Education comprehension: verbalized understanding, returned demonstration, verbal cues required, and tactile cues required   HOME EXERCISE PROGRAM: Access Code: [place] URL: [email or web address] Date: 06/07/2022 Prepared by: Gar Ponto   Exercises - Supine Bridge  - 1 x daily - 7 x weekly - 1 sets - 10 reps - 5 hold - Supine Posterior Pelvic Tilt  - 1 x daily - 7 x weekly - 1 sets - 10 reps - 3 hold - Hooklying Hamstring Stretch with Strap  - 1 x daily - 7 x weekly - 1 sets - 3 reps - 20 hold - Supine Piriformis Stretch with Foot on Ground  - 1 x daily - 7 x weekly - 1 sets - 3 reps - 20 hold - Supine Lower Trunk Rotation  - 1 x daily - 7 x weekly - 1 sets - 3 reps - 20 hold   ASSESSMENT:   CLINICAL IMPRESSION: Pt returns to PT today after an eval on 06/07/22. Pt reports his low back continues to feel good with no pain. Due to being sick with a cold, he has not been able to complete his HEP consistently. Pt presents today still with hsi cold still significant and coughing. PT was completed for therex for lumbopelvic strengthening and flexibility. Repetitions were limited due to pt coughing, and the session was stopped  at that point. Pt returned demonstration  of his HEP and is to try and complete more consistently.   OBJECTIVE IMPAIRMENTS: decreased activity tolerance, decreased strength, pain, and high BMI .    ACTIVITY LIMITATIONS: carrying, lifting, bending, and sitting   PARTICIPATION LIMITATIONS: cleaning, laundry, driving, occupation, and yard work   PERSONAL FACTORS: Age, Fitness, Past/current experiences, Time since onset of injury/illness/exacerbation, and 1 comorbidity: High BMI  are also affecting patient's functional outcome.    REHAB POTENTIAL: Good   CLINICAL DECISION MAKING: Stable/uncomplicated   EVALUATION COMPLEXITY: Low     GOALS:   SHORT TERM GOALS=LTGs     LONG TERM GOALS: Target date: 07/19/22   Pt will be Ind in a final HEP to maintain achieved LOF Baseline: initiated Goal status: Ongoing   2.  Pt will voice understanding of measures to assist in pain reduction  Baseline:  Goal status: INITIAL   3.  Improve trunk flexion to min limited for improved lumbar mobility and function Baseline: Mod limited Goal status: INITIAL   4.  Pt will demonstrate improved core strength by completing a plank from his knees for 30" Baseline:  Goal status: INITIAL   5.  Pt's FOTO score will improved to the predicted value of 73% for L hip and 70% for low back as indication of improved function  Baseline: 57% L hip and 50% low back Goal status: INITIAL   PLAN:   PT FREQUENCY: 1x/week   PT DURATION: 6 weeks   PLANNED INTERVENTIONS: Therapeutic exercises, Therapeutic activity, Balance training, Patient/Family education, Self Care, Joint mobilization, Aquatic Therapy, Dry Needling, Electrical stimulation, Spinal manipulation, Spinal mobilization, Cryotherapy, Moist heat, Taping, Traction, Ultrasound, Ionotophoresis '4mg'$ /ml Dexamethasone, Manual therapy, and Re-evaluation.   PLAN FOR NEXT SESSION: Review FOTO; assess response to HEP; progress therex as indicated; use of modalities, manual therapy; and TPDN as  indicated.  Tamilyn Lupien MS, PT 06/28/22 8:57 AM

## 2022-06-28 NOTE — ED Triage Notes (Signed)
Pt is here for SOB, cough,  chest feels tight runny nose x2wks

## 2022-06-28 NOTE — ED Notes (Signed)
ED TO INPATIENT HANDOFF REPORT  ED Nurse Name and Phone #: [phone]  S Name/Age/Gender Scott Dodson 71 y.o. male Room/Bed: 024C/024C  Code Status   Code Status: Full Code  Home/SNF/Other Home Patient oriented to: self, place, time, and situation Is this baseline? Yes   Triage Complete: Triage complete  Chief Complaint Acute asthma exacerbation [J45.901]  Triage Note Pt coming from urgent care, sats in the 80s and nonrebreather applied and sat at 100% Hx of asthma  Alert and oriented  121/79 BP 69 HR 24 RR Solumetrol and neb tx at urgent care, did not help, nurse put him up to 6L with still sating in the 80s, went to non-rebreather and sat increased to 100   Allergies No Known Allergies  Level of Care/Admitting Diagnosis ED Disposition     ED Disposition  Admit   Condition  --   [place]: [place] [[id number]]  Level of Care: Med-Surg [16]  May place patient in observation at [facility] or [facility] if equivalent level of care is available:: No  Covid Evaluation: Asymptomatic - no recent exposure (last 10 days) testing not required  Diagnosis: Acute asthma exacerbation [573220]  Admitting Physician: Shelly Coss [[id number]]  Attending Physician: Shelly Coss [[id number]]          B Medical/Surgery History Past Medical History:  Diagnosis Date   Allergy    Asthma    Coronary artery disease    s/p lateral MI 4 /11, s/p Promus DES OM2   Diabetes mellitus without complication ([place])    Heart murmur    Hyperlipidemia    Hypertension    Myocardial infarct ([place]) 2011   Past Surgical History:  Procedure Laterality Date   ADENOIDECTOMY     CORONARY ARTERY BYPASS GRAFT  2011   CORONARY [place] Right 2004   VASECTOMY       A IV Location/Drains/Wounds Patient Lines/Drains/Airways Status     Active Line/Drains/Airways     Name Placement date Placement time Site  Days   Peripheral IV 06/28/22 20 G Anterior;Distal;Right;Upper Arm 06/28/22  1300  Arm  less than 1            Intake/Output Last 24 hours No intake or output data in the 24 hours ending 06/28/22 1609  Labs/Imaging Results for orders placed or performed during the hospital encounter of 06/28/22 (from the past 48 hour(s))  CBC     Status: Abnormal   Collection Time: 06/28/22 12:34 PM  Result Value Ref Range   WBC 19.5 (H) 4.0 - 10.5 K/uL   RBC 5.61 4.22 - 5.81 MIL/uL   Hemoglobin 16.5 13.0 - 17.0 g/dL   HCT 48.2 39.0 - 52.0 %   MCV 85.9 80.0 - 100.0 fL   MCH 29.4 26.0 - 34.0 pg   MCHC 34.2 30.0 - 36.0 g/dL   RDW 12.5 11.5 - 15.5 %   Platelets 255 150 - 400 K/uL   nRBC 0.0 0.0 - 0.2 %    Comment: Performed at [facility], [address], [place], [place] [address]  Comprehensive metabolic panel     Status: Abnormal   Collection Time: 06/28/22 12:34 PM  Result Value Ref Range   Sodium 137 135 - 145 mmol/L   Potassium 4.0 3.5 - 5.1 mmol/L   Chloride 101 98 - 111 mmol/L   CO2 22 22 - 32 mmol/L  Glucose, Bld 148 (H) 70 - 99 mg/dL    Comment: Glucose reference range applies only to samples taken after fasting for at least 8 hours.   BUN 15 8 - 23 mg/dL   Creatinine, Ser 1.33 (H) 0.61 - 1.24 mg/dL   Calcium 9.2 8.9 - 10.3 mg/dL   Total Protein 7.4 6.5 - 8.1 g/dL   Albumin 3.8 3.5 - 5.0 g/dL   AST 18 15 - 41 U/L   ALT 17 0 - 44 U/L   Alkaline Phosphatase 61 38 - 126 U/L   Total Bilirubin 1.5 (H) 0.3 - 1.2 mg/dL   GFR, Estimated 58 (L) >60 mL/min    Comment: (NOTE) Calculated using the CKD-EPI Creatinine Equation (2021)    Anion gap 14 5 - 15    Comment: Performed at [facility] [address], [place], [place] [address]  Troponin I (High Sensitivity)     Status: Abnormal   Collection Time: 06/28/22 12:34 PM  Result Value Ref Range   Troponin I (High Sensitivity) 34 (H) <18 ng/L    Comment: (NOTE) Elevated high sensitivity troponin I (hsTnI) values and  significant  changes across serial measurements may suggest ACS but many other  chronic and acute conditions are known to elevate hsTnI results.  Refer to the "Links" section for chest pain algorithms and additional  guidance. Performed at [facility], [place] [address] [place], [place] [address]   Lactic acid, plasma     Status: None   Collection Time: 06/28/22 12:34 PM  Result Value Ref Range   Lactic Acid, Venous 1.7 0.5 - 1.9 mmol/L    Comment: Performed at [facility] [address], [place], [place] [address]   DG Chest Port 1 View  Result Date: 06/28/2022 CLINICAL DATA:  Shortness of breath, cough EXAM: PORTABLE CHEST 1 VIEW COMPARISON:  Portable exam 1245 hours compared to 06/28/2022 at 1047 hours FINDINGS: Normal heart size, mediastinal contours, and pulmonary vascularity. Lungs clear. No pulmonary infiltrate, pleural effusion, or pneumothorax. Osseous structures unremarkable. IMPRESSION: No acute abnormalities. Electronically Signed   By: Lavonia Dana M.D.   On: 06/28/2022 13:10   DG Chest 2 View  Result Date: 06/28/2022 CLINICAL DATA:  Shortness of breath, cough, chest pain, wheezing, chest tightness, and runny nose for 2 weeks EXAM: CHEST - 2 VIEW COMPARISON:  06/19/2022 FINDINGS: Normal heart size, mediastinal contours, and pulmonary vascularity. Chronic increased markings in the lower lungs. No acute infiltrate, pleural effusion, or pneumothorax. Osseous structures unremarkable. IMPRESSION: No acute abnormalities. Electronically Signed   By: Lavonia Dana M.D.   On: 06/28/2022 10:56    Pending Labs Unresulted Labs (From admission, onward)     Start     Ordered   06/29/22 6967  Basic metabolic panel  Tomorrow morning,   R        06/28/22 1525   06/29/22 0500  CBC  Tomorrow morning,   R        06/28/22 1525   06/28/22 1527  Hemoglobin A1c  Add-on,   AD        06/28/22 1526   06/28/22 1239  Resp panel by RT-PCR (RSV, Flu A&B, Covid) Anterior Nasal Swab  (Tier 2  - SymptomaticResp panel by RT-PCR (RSV, Flu A&B, Covid))  Once,   URGENT        06/28/22 1238            Vitals/Pain Today's Vitals   06/28/22 1345 06/28/22 1400  06/28/22 1445 06/28/22 1500  BP: (!) 141/74 132/89 117/69 131/68  Pulse: 71 72 67 70  Resp: 18 (!) 22 19 (!) 24  SpO2: 99% 99% 100% 99%    Isolation Precautions No active isolations  Medications Medications  albuterol (PROVENTIL) (2.5 MG/3ML) 0.083% nebulizer solution 5 mg (has no administration in time range)  aspirin EC tablet 81 mg (has no administration in time range)  rosuvastatin (CRESTOR) tablet 20 mg (has no administration in time range)  fluticasone (FLONASE) 50 MCG/ACT nasal spray 1 spray (has no administration in time range)  enoxaparin (LOVENOX) injection 40 mg (has no administration in time range)  polyethylene glycol (MIRALAX / GLYCOLAX) packet 17 g (has no administration in time range)  0.9 %  sodium chloride infusion (has no administration in time range)  ipratropium-albuterol (DUONEB) 0.5-2.5 (3) MG/3ML nebulizer solution 3 mL (has no administration in time range)  guaiFENesin-dextromethorphan (ROBITUSSIN DM) 100-10 MG/5ML syrup 10 mL (has no administration in time range)  insulin aspart (novoLOG) injection 0-15 Units (has no administration in time range)  methylPREDNISolone sodium succinate (SOLU-MEDROL) 125 mg/2 mL injection 80 mg (has no administration in time range)  azithromycin (ZITHROMAX) tablet 500 mg (has no administration in time range)  ipratropium (ATROVENT) nebulizer solution 0.5 mg (0.5 mg Nebulization Given 06/28/22 1522)  methylPREDNISolone sodium succinate (SOLU-MEDROL) 125 mg/2 mL injection 125 mg (125 mg Intravenous Given 06/28/22 1305)  albuterol (PROVENTIL) (2.5 MG/3ML) 0.083% nebulizer solution 5 mg (5 mg Nebulization Given 06/28/22 1510)    Mobility walks Low fall risk   Focused Assessments Pulmonary Assessment Handoff:  Lung sounds:   O2 Device: Room Air       R Recommendations: See Admitting Provider Note  Report given to:   Additional Notes:

## 2022-06-28 NOTE — ED Notes (Signed)
Pt was triaged for SOB o/2 was 95% PA notified

## 2022-06-28 NOTE — ED Provider Notes (Signed)
[place]    CSN: [id number] Arrival date & time: 06/28/22  5809      History   Chief Complaint Chief Complaint  Patient presents with   Cough   Shortness of Breath    HPI Scott Dodson is a 71 y.o. male.   Subjective:   Scott Dodson is a 71 y.o. male who presents for a 2-week history of chest tightness, dyspnea, non-productive cough, and wheezing.  The patient has been previously diagnosed with asthma.  He reports that he checks his oxygen routinely.  Oxygen saturations usually run around 95% but has dropped down as low as 88% today.  His symptoms have progressively worsened over the past couple of days.  He denies any fevers, runny nose, nasal congestion, sneezing, nausea, vomiting, diarrhea or headache.  He does not smoke or vape.  Notably, patient was seen on 06/19/2022 at [facility] urgent care-[place] location for similar symptoms.  Chest x-ray was normal.  He was given a steroid injection and nebulizer in the clinic.  He was diagnosed with an acute asthma exacerbation and sent home with prescriptions for Combivent, Zyrtec, Flonase, Promethazine DM and 3 days of steroids.  Patient reports compliance with prescribed therapies but states that they were not helpful.  Patient has a history of type 2 diabetes and compliant with his prescribed therapies for this.  He does not check his blood sugars but last A1c was 6.4% on 05/21/2022.  The following portions of the patient's history were reviewed and updated as appropriate: allergies, current medications, past family history, past medical history, past social history, past surgical history, and problem list.       Past Medical History:  Diagnosis Date   Allergy    Asthma    Coronary artery disease    s/p lateral MI 4 /11, s/p Promus DES OM2   Diabetes mellitus without complication ([place])    Heart murmur    Hyperlipidemia    Hypertension    Myocardial infarct ([place]) 2011    Patient Active Problem List    Diagnosis Date Noted   Ischemic cardiomyopathy 05/21/2022   Esophageal dysphagia 05/21/2022   Chronic bilateral low back pain without sciatica 05/21/2022   Chronic left hip pain 05/21/2022   Age-related nuclear cataract of both eyes 02/05/2021   Vitreomacular adhesion of left eye 12/13/2019   Severe nonproliferative diabetic retinopathy of right eye without macular edema associated with type 2 diabetes mellitus ([place]) 12/13/2019   Severe nonproliferative diabetic retinopathy of left eye, with macular edema, associated with type 2 diabetes mellitus ([place]) 12/13/2019   Vitreomacular adhesion of right eye 12/13/2019   History of diabetes mellitus 02/09/2018   CARDIOMYOPATHY, ISCHEMIC 05/18/2010   Coronary atherosclerosis 10/11/2009   Dyslipidemia associated with type 2 diabetes mellitus ([place]) 10/09/2009   HYPERLIPIDEMIA 10/09/2009   Hypertension associated with diabetes ([place]) 10/09/2009   MYOCARDIAL INFARCTION, HX OF 10/09/2009    Past Surgical History:  Procedure Laterality Date   ADENOIDECTOMY     CORONARY ARTERY BYPASS GRAFT  2011   CORONARY STENT PLACEMENT     TONSILLECTOMY     TYMPANIC MEMBRANE REPAIR Right 2004   VASECTOMY         Home Medications    Prior to Admission medications   Medication Sig Start Date End Date Taking? Authorizing Provider  amLODipine (NORVASC) 5 MG tablet TAKE 1 TABLET BY MOUTH DAILY 06/05/22  Yes Sagardia, Ines Bloomer, MD  Ascorbic Acid (VITAMIN C) 1000 MG tablet Take 2,000 mg by  mouth daily.   Yes [provider]  aspirin 81 MG tablet Take 81 mg by mouth daily.   Yes [provider]  carvedilol (COREG) 25 MG tablet Take 1 tablet (25 mg total) by mouth 2 (two) times daily with a meal. TAKE 1 TABLET BY MOUTH  TWICE DAILY WITH MEALS 05/21/22 05/16/23 Yes Sagardia, Ines Bloomer, MD  cetirizine (ZYRTEC ALLERGY) 10 MG tablet Take 1 tablet (10 mg total) by mouth at bedtime. 06/19/22 12/16/22 Yes Lynden Oxford Scales, PA-C  Coenzyme Q10 (CO  Q 10 PO) Take by mouth.   Yes [provider]  dapagliflozin propanediol (FARXIGA) 10 MG TABS tablet Take 1 tablet (10 mg total) by mouth daily before breakfast. 05/21/22 05/16/23 Yes Sagardia, Ines Bloomer, MD  fluticasone [facility]) 50 MCG/ACT nasal spray Place 1 spray into both nostrils daily. Begin by using 2 sprays in each nare daily for 3 to 5 days, then decrease to 1 spray in each nare daily. 06/19/22  Yes Lynden Oxford Scales, PA-C  glipiZIDE (GLUCOTROL) 5 MG tablet Take 1 tablet (5 mg total) by mouth daily with breakfast. 05/21/22  Yes Sagardia, Ines Bloomer, MD  Ipratropium-Albuterol (COMBIVENT RESPIMAT) 20-100 MCG/ACT AERS respimat Inhale 1 puff into the lungs every 6 (six) hours. 06/19/22 07/19/22 Yes Lynden Oxford Scales, PA-C  lisinopril (ZESTRIL) 40 MG tablet Take 1 tablet (40 mg total) by mouth daily. 05/21/22  Yes Sagardia, Ines Bloomer, MD  meloxicam (MOBIC) 15 MG tablet Take 1 tablet (15 mg total) by mouth daily. 05/27/22  Yes Glennon Mac, DO  metFORMIN (GLUCOPHAGE) 500 MG tablet Take 1 tablet (500 mg total) by mouth 2 (two) times daily with a meal. 05/21/22  Yes Sagardia, Ines Bloomer, MD  promethazine-dextromethorphan (PROMETHAZINE-DM) 6.25-15 MG/5ML syrup Take 5 mLs by mouth 4 (four) times daily as needed for cough. 06/19/22  Yes Lynden Oxford Scales, PA-C  rosuvastatin (CRESTOR) 20 MG tablet Take 1 tablet (20 mg total) by mouth daily. 05/21/22  Yes Sagardia, Ines Bloomer, MD    Family History Family History  Problem Relation Age of Onset   Heart disease Mother    Hypertension Mother    Heart disease Father    Hypertension Father    Heart disease Brother    Heart disease Sister    Colon cancer Maternal Grandfather 92   Colon polyps Neg Hx    Esophageal cancer Neg Hx    Stomach cancer Neg Hx    Rectal cancer Neg Hx     Social History Social History   Tobacco Use   Smoking status: Former    Packs/day: 0.50    Years: 5.00    Total pack years: 2.50     Types: Cigarettes    Quit date: 10/14/1974    Years since quitting: 47.7   Smokeless tobacco: Never  Vaping Use   Vaping Use: Never used  Substance Use Topics   Alcohol use: Yes    Alcohol/week: 5.0 standard drinks of alcohol    Types: 5 Standard drinks or equivalent per week   Drug use: No     Allergies   Patient has no known allergies.   Review of Systems Review of Systems  Constitutional:  Positive for fatigue. Negative for fever.  HENT:  Negative for congestion, rhinorrhea and sore throat.   Respiratory:  Positive for cough, chest tightness, shortness of breath and wheezing.   Cardiovascular:  Negative for chest pain, palpitations and leg swelling.  Gastrointestinal:  Negative for diarrhea, nausea and vomiting.  Musculoskeletal:  Negative for myalgias.  Neurological:  Negative for dizziness and headaches.  All other systems reviewed and are negative.    Physical Exam Triage Vital Signs ED Triage Vitals  Enc Vitals Group     BP 06/28/22 0838 134/83     Pulse Rate 06/28/22 0838 80     Resp 06/28/22 0838 18     Temp 06/28/22 0838 98.3 F (36.8 C)     Temp Source 06/28/22 0838 Oral     SpO2 06/28/22 0838 95 %     Weight --      Height --      Head Circumference --      Peak Flow --      Pain Score 06/28/22 0837 0     Pain Loc --      Pain Edu? --      Excl. in [place]? --    No data found.  Updated Vital Signs BP 121/75 (BP Location: Right Arm)   Pulse 73   Temp 98.5 F (36.9 C) (Oral)   Resp 18   SpO2 97%   Visual Acuity Right Eye Distance:   Left Eye Distance:   Bilateral Distance:    Right Eye Near:   Left Eye Near:    Bilateral Near:     Physical Exam Vitals reviewed.  Constitutional:      General: He is not in acute distress.    Appearance: He is well-developed. He is not ill-appearing, toxic-appearing or diaphoretic.  HENT:     Head: Normocephalic.     Mouth/Throat:     Mouth: Mucous membranes are moist.  Cardiovascular:     Rate and  Rhythm: Normal rate and regular rhythm.  Pulmonary:     Effort: Pulmonary effort is normal. No tachypnea, accessory muscle usage or respiratory distress.     Breath sounds: Decreased breath sounds present. No wheezing.  Abdominal:     Palpations: Abdomen is soft.  Musculoskeletal:        General: Normal range of motion.     Cervical back: Normal range of motion and neck supple.  Lymphadenopathy:     Cervical: No cervical adenopathy.  Skin:    General: Skin is warm and dry.  Neurological:     General: No focal deficit present.     Mental Status: He is alert and oriented to person, place, and time.  Psychiatric:        Mood and Affect: Mood normal.        Behavior: Behavior normal.      UC Treatments / Results  Labs (all labs ordered are listed, but only abnormal results are displayed) Labs Reviewed - No data to display  EKG   Radiology DG Chest 2 View  Result Date: 06/28/2022 CLINICAL DATA:  Shortness of breath, cough, chest pain, wheezing, chest tightness, and runny nose for 2 weeks EXAM: CHEST - 2 VIEW COMPARISON:  06/19/2022 FINDINGS: Normal heart size, mediastinal contours, and pulmonary vascularity. Chronic increased markings in the lower lungs. No acute infiltrate, pleural effusion, or pneumothorax. Osseous structures unremarkable. IMPRESSION: No acute abnormalities. Electronically Signed   By: Lavonia Dana M.D.   On: 06/28/2022 10:56    Procedures Procedures (including critical care time)  Medications Ordered in UC Medications  albuterol (PROVENTIL) (2.5 MG/3ML) 0.083% nebulizer solution 2.5 mg (2.5 mg Nebulization Given 06/28/22 1054)  dexamethasone (DECADRON) injection 10 mg (10 mg Intramuscular Given 06/28/22 1054)    Initial Impression / Assessment and Plan / UC Course  I have reviewed the triage vital signs and the nursing notes.  Pertinent labs & imaging results that were available during my care of the patient were reviewed by me and considered in my  medical decision making (see chart for details).     71 yo male with history of asthma presents with a two-week history of chest tightness, shortness of breath, nonproductive cough and wheezing.  He does not smoke or vape.  No fevers.  He was seen on 06/19/2022 for similar symptoms.  A steroid injection and nebulizer was given in the clinic.  Chest x-ray was normal. He was diagnosed with an acute asthma exacerbation and sent home with prescriptions for Combivent, Zyrtec, Flonase, Promethazine DM and 3 days of oral steroids.  Patient reports worsening of symptoms over the past couple of days despite compliance with prescribed therapies.  Patient alert and oriented.  Afebrile.  Nontoxic.  Physical exam as above. Hypoxic down to 83% on room air.  CXR was repeated today and again found to be normal.  Albuterol nebulizer and 10 mg Decadron given in the clinic.  Patient subjectively feels better but continues to be hypoxic.  He was placed on nasal cannula and titrated up to a nonrebreather to get saturations above 93%.  Patient remains in no acute distress but due to his new oxygen requirement will be discharged to the emergency department for further evaluation.  Patient agreeable.  Report given to [facility].  Patient left clinic in stable condition.  Documentation was completed with the aid of voice recognition software. Transcription may contain typographical errors. Final Clinical Impressions(s) / UC Diagnoses   Final diagnoses:  Hypoxia  Cough, unspecified type  Dyspnea, unspecified type  Asthma with acute exacerbation, unspecified asthma severity, unspecified whether persistent   Discharge Instructions   None    ED Prescriptions   None    PDMP not reviewed this encounter.   Enrique Sack, [place] 06/28/22 1205

## 2022-06-28 NOTE — ED Notes (Signed)
[facility] made aware of transport needed to ED.  Maggie Agricultural consultant made aware of patient coming via [facility]

## 2022-06-28 NOTE — ED Triage Notes (Signed)
Pt having coughing for 2 weeks now, has been using inhaler at home but has not helped with the SOB, or cough. Now having O2 level of 89 right now, pt states at home he checks it and it has been below 90 for today and yesterday.
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
## 2022-06-29 DIAGNOSIS — E785 Hyperlipidemia, unspecified: Secondary | ICD-10-CM | POA: Diagnosis present

## 2022-06-29 DIAGNOSIS — T380X5A Adverse effect of glucocorticoids and synthetic analogues, initial encounter: Secondary | ICD-10-CM | POA: Diagnosis present

## 2022-06-29 DIAGNOSIS — R0602 Shortness of breath: Secondary | ICD-10-CM | POA: Diagnosis present

## 2022-06-29 DIAGNOSIS — I10 Essential (primary) hypertension: Secondary | ICD-10-CM | POA: Diagnosis present

## 2022-06-29 DIAGNOSIS — J9601 Acute respiratory failure with hypoxia: Secondary | ICD-10-CM | POA: Diagnosis not present

## 2022-06-29 DIAGNOSIS — I2510 Atherosclerotic heart disease of native coronary artery without angina pectoris: Secondary | ICD-10-CM | POA: Diagnosis present

## 2022-06-29 DIAGNOSIS — I255 Ischemic cardiomyopathy: Secondary | ICD-10-CM | POA: Diagnosis present

## 2022-06-29 DIAGNOSIS — Z1152 Encounter for screening for COVID-19: Secondary | ICD-10-CM | POA: Diagnosis not present

## 2022-06-29 DIAGNOSIS — Z7982 Long term (current) use of aspirin: Secondary | ICD-10-CM | POA: Diagnosis not present

## 2022-06-29 DIAGNOSIS — Z79899 Other long term (current) drug therapy: Secondary | ICD-10-CM | POA: Diagnosis not present

## 2022-06-29 DIAGNOSIS — Z7984 Long term (current) use of oral hypoglycemic drugs: Secondary | ICD-10-CM | POA: Diagnosis not present

## 2022-06-29 DIAGNOSIS — R0902 Hypoxemia: Secondary | ICD-10-CM | POA: Diagnosis not present

## 2022-06-29 DIAGNOSIS — E1165 Type 2 diabetes mellitus with hyperglycemia: Secondary | ICD-10-CM | POA: Diagnosis present

## 2022-06-29 DIAGNOSIS — J4521 Mild intermittent asthma with (acute) exacerbation: Secondary | ICD-10-CM | POA: Diagnosis not present

## 2022-06-29 DIAGNOSIS — N179 Acute kidney failure, unspecified: Secondary | ICD-10-CM | POA: Diagnosis present

## 2022-06-29 DIAGNOSIS — Z800 Family history of malignant neoplasm of digestive organs: Secondary | ICD-10-CM | POA: Diagnosis not present

## 2022-06-29 DIAGNOSIS — Z87891 Personal history of nicotine dependence: Secondary | ICD-10-CM | POA: Diagnosis not present

## 2022-06-29 DIAGNOSIS — J4541 Moderate persistent asthma with (acute) exacerbation: Secondary | ICD-10-CM | POA: Diagnosis present

## 2022-06-29 DIAGNOSIS — Z8249 Family history of ischemic heart disease and other diseases of the circulatory system: Secondary | ICD-10-CM | POA: Diagnosis not present

## 2022-06-29 DIAGNOSIS — J45901 Unspecified asthma with (acute) exacerbation: Secondary | ICD-10-CM | POA: Diagnosis not present

## 2022-06-29 DIAGNOSIS — Z951 Presence of aortocoronary bypass graft: Secondary | ICD-10-CM | POA: Diagnosis not present

## 2022-06-29 LAB — BASIC METABOLIC PANEL
Anion gap: 14 (ref 5–15)
BUN: 25 mg/dL — ABNORMAL HIGH (ref 8–23)
CO2: 21 mmol/L — ABNORMAL LOW (ref 22–32)
Calcium: 8.8 mg/dL — ABNORMAL LOW (ref 8.9–10.3)
Chloride: 104 mmol/L (ref 98–111)
Creatinine, Ser: 1.08 mg/dL (ref 0.61–1.24)
GFR, Estimated: >60 mL/min (ref >60)
Glucose, Bld: 189 mg/dL — ABNORMAL HIGH (ref 70–99)
Potassium: 4.1 mmol/L (ref 3.5–5.1)
Sodium: 139 mmol/L (ref 135–145)

## 2022-06-29 LAB — CBC
HCT: 40.8 % (ref 39.0–52.0)
Hemoglobin: 13.9 g/dL (ref 13.0–17.0)
MCH: 29.5 pg (ref 26.0–34.0)
MCHC: 34.1 g/dL (ref 30.0–36.0)
MCV: 86.6 fL (ref 80.0–100.0)
Platelets: 242 10*3/uL (ref 150–400)
RBC: 4.71 MIL/uL (ref 4.22–5.81)
RDW: 12.4 % (ref 11.5–15.5)
WBC: 12.1 10*3/uL — ABNORMAL HIGH (ref 4.0–10.5)
nRBC: 0 % (ref 0.0–0.2)

## 2022-06-29 LAB — GLUCOSE, CAPILLARY
Glucose-Capillary: 186 mg/dL — ABNORMAL HIGH (ref 70–99)
Glucose-Capillary: 306 mg/dL — ABNORMAL HIGH (ref 70–99)
Glucose-Capillary: 250 mg/dL — ABNORMAL HIGH (ref 70–99)
Glucose-Capillary: 297 mg/dL — ABNORMAL HIGH (ref 70–99)

## 2022-06-29 MED ORDER — IPRATROPIUM-ALBUTEROL 0.5-2.5 (3) MG/3ML IN SOLN
3.0000 mL | Freq: Three times a day (TID) | RESPIRATORY_TRACT | Status: DC
  Administered 2022-06-29 – 2022-06-30 (×2): 3 mL via RESPIRATORY_TRACT
  Filled 2022-06-29 (×2): qty 3

## 2022-06-29 MED ORDER — BUDESONIDE 0.5 MG/2ML IN SUSP
0.5000 mg | Freq: Two times a day (BID) | RESPIRATORY_TRACT | Status: DC
  Administered 2022-06-29 – 2022-06-30 (×2): 0.5 mg via RESPIRATORY_TRACT
  Filled 2022-06-29 (×3): qty 2

## 2022-06-29 MED ORDER — INSULIN GLARGINE-YFGN 100 UNIT/ML ~~LOC~~ SOLN
10.0000 [IU] | Freq: Every day | SUBCUTANEOUS | Status: DC
  Administered 2022-06-29: 10 [IU] via SUBCUTANEOUS
  Filled 2022-06-29 (×2): qty 0.1

## 2022-06-29 NOTE — Progress Notes (Signed)
PROGRESS NOTE  Scott Dodson  MVH:[id number] DOB: [birth year] DOA: 06/28/2022 PCP: Horald Pollen, MD   Brief Narrative:  Scott Dodson is a 71 y.o. male with medical history significant of hypertension, diabetes type 2, hyperlipidemia, coronary artery disease s/p PCI, mild intermittent asthma who presented from home with complaints of 2 weeks history of chest tightness, shortness of breath, nonproductive cough, wheezing.  Patient has been diagnosed with asthma in the past  but does not get frequent asthma attacks.  Last attack was on 1993.He regularly checks his oxygenation at home and found that it does drop to the range of 88%. He is a past smoker. He progressively felt worse over the last few days with worsening shortness of breath and cough.  He was also seen at [facility] urgent care at [facility] on 12/20, at that time chest x-ray was normal.  On presentation, he was hypoxic, wheezing.  Patient was admitted for the management of asthma exacerbation.  Assessment & Plan:  Principal Problem:   Acute asthma exacerbation Active Problems:   History of diabetes mellitus   Ischemic cardiomyopathy   HLD (hyperlipidemia)   HTN (hypertension)  Acute asthma exacerbation: History of mild intermittent asthma.  Having congestion, cough, upper respiratory symptoms, wheezing at home. Continue Solu-Medrol for now.  Continue bronchodilators, mucolytics. Started on azithromycin.   Acute hypoxic respiratory failure:Secondary to asthma exacerbation.  Does not use oxygen at home.  Currently on 3 L of oxygen.  Will continue to wean.  COVID/flu swab negative   Leukocytosis: Unclear etiology.  Chest x-ray without pneumonia.  Recent history of steroids intake.  Started on azithromycin here for asthma exacerbation to cover for bronchitis.Improving   AKI: Baseline creatinine normal.  Resolved with IV fluids  History of diabetes type 2: Takes metformin, glipizide, Farxiga at home.  Currently on  sliding scale.  Monitor blood sugars   Hypertension: Currently normotensive.  Takes lisinopril, Coreg at home.  Currently on hold.  Will resume when appropriate.   History of coronary artery disease: Takes aspirin which has been resumed.  Denies any anginal symptoms.  Status post PCI several years ago..         DVT prophylaxis:enoxaparin (LOVENOX) injection 40 mg Start: 06/28/22 1600     Code Status: Full Code  Family Communication: None at the bedside  Patient status: Obs  Patient is from :Home  Anticipated discharge WU:XLKG  Estimated DC date:tomorrow   Consultants: None  Procedures:None  Antimicrobials:  Anti-infectives (From admission, onward)    Start     Dose/Rate Route Frequency Ordered Stop   06/28/22 1600  azithromycin (ZITHROMAX) tablet 500 mg        500 mg Oral Daily 06/28/22 1547         Subjective:  Patient seen and examined at bedside today.  Hemodynamically stable.  Was on 4 L of oxygen during my evaluation.  He feels much better today but still on 4 L so working on to wean the oxygen.  He is not on oxygen at home.  He denies any worsening cough or shortness of breath.  Objective: Vitals:   06/28/22 1941 06/29/22 0426 06/29/22 0751 06/29/22 0802  BP: 132/74 138/83  (!) 145/76  Pulse: 87 80  79  Resp: '17 17  16  '$ Temp: 97.9 F (36.6 C) 98 F (36.7 C)  (!) 97.5 F (36.4 C)  TempSrc: Oral   Oral  SpO2: 97% 97% 95% 94%  Weight:      Height:  Intake/Output Summary (Last 24 hours) at 06/29/2022 1330 Last data filed at 06/29/2022 0331 Gross per 24 hour  Intake 2203.46 ml  Output --  Net 2203.46 ml   Filed Weights   06/28/22 1722  Weight: 94.3 kg    Examination:  General exam: Overall comfortable, not in distress HEENT: PERRL Respiratory system: Coarse breath sound bilaterally, no frank wheezing or crackles. Cardiovascular system: S1 & S2 heard, RRR.  Gastrointestinal system: Abdomen is nondistended, soft and  nontender. Central nervous system: Alert and oriented Extremities: No edema, no clubbing ,no cyanosis Skin: No rashes, no ulcers,no icterus     Data Reviewed: I have personally reviewed following labs and imaging studies  CBC: Recent Labs  Lab 06/28/22 1234 06/29/22 0330  WBC 19.5* 12.1*  HGB 16.5 13.9  HCT 48.2 40.8  MCV 85.9 86.6  PLT 255 408   Basic Metabolic Panel: Recent Labs  Lab 06/28/22 1234 06/29/22 0330  NA 137 139  K 4.0 4.1  CL 101 104  CO2 22 21*  GLUCOSE 148* 189*  BUN 15 25*  CREATININE 1.33* 1.08  CALCIUM 9.2 8.8*     Recent Results (from the past 240 hour(s))  Resp panel by RT-PCR (RSV, Flu A&B, Covid) Anterior Nasal Swab     Status: None   Collection Time: 06/28/22 12:39 PM   Specimen: Anterior Nasal Swab  Result Value Ref Range Status   SARS Coronavirus 2 by RT PCR NEGATIVE NEGATIVE Final    Comment: (NOTE) SARS-CoV-2 target nucleic acids are NOT DETECTED.  The SARS-CoV-2 RNA is generally detectable in upper respiratory specimens during the acute phase of infection. The lowest concentration of SARS-CoV-2 viral copies this assay can detect is 138 copies/mL. A negative result does not preclude SARS-Cov-2 infection and should not be used as the sole basis for treatment or other patient management decisions. A negative result may occur with  improper specimen collection/handling, submission of specimen other than nasopharyngeal swab, presence of viral mutation(s) within the areas targeted by this assay, and inadequate number of viral copies(<138 copies/mL). A negative result must be combined with clinical observations, patient history, and epidemiological information. The expected result is Negative.  Fact Sheet for Patients:  EntrepreneurPulse.com.au  Fact Sheet for Healthcare Providers:  IncredibleEmployment.be  This test is no t yet approved or cleared by the [place] FDA and  has been authorized  for detection and/or diagnosis of SARS-CoV-2 by FDA under an Emergency Use Authorization (EUA). This EUA will remain  in effect (meaning this test can be used) for the duration of the COVID-19 declaration under Section 564(b)(1) of the Act, 21 U.S.C.section 360bbb-3(b)(1), unless the authorization is terminated  or revoked sooner.       Influenza A by PCR NEGATIVE NEGATIVE Final   Influenza B by PCR NEGATIVE NEGATIVE Final    Comment: (NOTE) The Xpert Xpress SARS-CoV-2/FLU/RSV plus assay is intended as an aid in the diagnosis of influenza from Nasopharyngeal swab specimens and should not be used as a sole basis for treatment. Nasal washings and aspirates are unacceptable for Xpert Xpress SARS-CoV-2/FLU/RSV testing.  Fact Sheet for Patients: EntrepreneurPulse.com.au  Fact Sheet for Healthcare Providers: IncredibleEmployment.be  This test is not yet approved or cleared by the [place] FDA and has been authorized for detection and/or diagnosis of SARS-CoV-2 by FDA under an Emergency Use Authorization (EUA). This EUA will remain in effect (meaning this test can be used) for the duration of the COVID-19 declaration under Section 564(b)(1) of the Act, 21  U.S.C. section 360bbb-3(b)(1), unless the authorization is terminated or revoked.     Resp Syncytial Virus by PCR NEGATIVE NEGATIVE Final    Comment: (NOTE) Fact Sheet for Patients: EntrepreneurPulse.com.au  Fact Sheet for Healthcare Providers: IncredibleEmployment.be  This test is not yet approved or cleared by the [place] FDA and has been authorized for detection and/or diagnosis of SARS-CoV-2 by FDA under an Emergency Use Authorization (EUA). This EUA will remain in effect (meaning this test can be used) for the duration of the COVID-19 declaration under Section 564(b)(1) of the Act, 21 U.S.C. section 360bbb-3(b)(1), unless the authorization is  terminated or revoked.  Performed at [facility], [address] [place], [place] [address]      Radiology Studies: DG Chest Port 1 View  Result Date: 06/28/2022 CLINICAL DATA:  Shortness of breath, cough EXAM: PORTABLE CHEST 1 VIEW COMPARISON:  Portable exam 1245 hours compared to 06/28/2022 at 1047 hours FINDINGS: Normal heart size, mediastinal contours, and pulmonary vascularity. Lungs clear. No pulmonary infiltrate, pleural effusion, or pneumothorax. Osseous structures unremarkable. IMPRESSION: No acute abnormalities. Electronically Signed   By: Lavonia Dana M.D.   On: 06/28/2022 13:10   DG Chest 2 View  Result Date: 06/28/2022 CLINICAL DATA:  Shortness of breath, cough, chest pain, wheezing, chest tightness, and runny nose for 2 weeks EXAM: CHEST - 2 VIEW COMPARISON:  06/19/2022 FINDINGS: Normal heart size, mediastinal contours, and pulmonary vascularity. Chronic increased markings in the lower lungs. No acute infiltrate, pleural effusion, or pneumothorax. Osseous structures unremarkable. IMPRESSION: No acute abnormalities. Electronically Signed   By: Lavonia Dana M.D.   On: 06/28/2022 10:56    Scheduled Meds:  albuterol  5 mg Nebulization Once   aspirin EC  81 mg Oral Daily   azithromycin  500 mg Oral Daily   enoxaparin (LOVENOX) injection  40 mg Subcutaneous Q24H   fluticasone  1 spray Each Nare Daily   insulin aspart  0-15 Units Subcutaneous TID WC   ipratropium-albuterol  3 mL Nebulization Q6H   methylPREDNISolone (SOLU-MEDROL) injection  80 mg Intravenous Daily   rosuvastatin  20 mg Oral Daily   Continuous Infusions:   LOS: 0 days   Shelly Coss, MD [facility] P12/30/2023, 1:30 PM

## 2022-06-30 ENCOUNTER — Inpatient Hospital Stay (HOSPITAL_COMMUNITY): Payer: Medicare Other

## 2022-06-30 DIAGNOSIS — J4521 Mild intermittent asthma with (acute) exacerbation: Secondary | ICD-10-CM | POA: Diagnosis not present

## 2022-06-30 LAB — D-DIMER, QUANTITATIVE: D-Dimer, Quant: <0.27 ug/mL-FEU (ref 0.00–0.50)

## 2022-06-30 LAB — GLUCOSE, CAPILLARY
Glucose-Capillary: 200 mg/dL — ABNORMAL HIGH (ref 70–99)
Glucose-Capillary: 380 mg/dL — ABNORMAL HIGH (ref 70–99)

## 2022-06-30 MED ORDER — ALBUTEROL SULFATE HFA 108 (90 BASE) MCG/ACT IN AERS: 2.0000 | INHALATION_SPRAY | Freq: Four times a day (QID) | RESPIRATORY_TRACT | 2 refills | Status: AC | PRN

## 2022-06-30 MED ORDER — INSULIN GLARGINE-YFGN 100 UNIT/ML ~~LOC~~ SOLN
20.0000 [IU] | Freq: Every day | SUBCUTANEOUS | Status: DC
  Administered 2022-06-30: 20 [IU] via SUBCUTANEOUS
  Filled 2022-06-30 (×2): qty 0.2

## 2022-06-30 MED ORDER — IPRATROPIUM-ALBUTEROL 0.5-2.5 (3) MG/3ML IN SOLN
3.0000 mL | Freq: Two times a day (BID) | RESPIRATORY_TRACT | Status: DC
  Filled 2022-06-30: qty 3

## 2022-06-30 MED ORDER — PREDNISONE 20 MG PO TABS: 40.0000 mg | ORAL_TABLET | Freq: Every day | ORAL | 0 refills | Status: AC

## 2022-06-30 MED ORDER — GUAIFENESIN-DM 100-10 MG/5ML PO SYRP: 10.0000 mL | ORAL_SOLUTION | Freq: Four times a day (QID) | ORAL | 1 refills | Status: DC | PRN

## 2022-06-30 NOTE — Discharge Summary (Signed)
Physician Discharge Summary  Scott Dodson FTD:[id number] DOB: 20-Nov-1950 DOA: 06/28/2022  PCP: Horald Pollen, MD  Admit date: 06/28/2022 Discharge date: 06/30/2022  Admitted From: Home Disposition:  Home  Discharge Condition:Stable CODE STATUS:FULL Diet recommendation: Heart Healthy   Brief/Interim Summary:   Scott Dodson is a 71 y.o. male with medical history significant of hypertension, diabetes type 2, hyperlipidemia, coronary artery disease s/p PCI, mild intermittent asthma who presented from home with complaints of 2 weeks history of chest tightness, shortness of breath, nonproductive cough, wheezing.  Patient has been diagnosed with asthma in the past  but does not get frequent asthma attacks.  Last attack was on 1993.He regularly checks his oxygenation at home and found that it does drop to the range of 88%. He is a past smoker.  On presentation, he was hypoxic, wheezing.  Patient was admitted for the management of asthma exacerbation.  He clinically feels much better but he continues to require oxygen so call for for home oxygen.  I have recommended him to follow-up with his PCP in a week and check if he continues to need oxygen at home.  DME oxygen arranged.  Medically stable for discharge.   Following problems were addressed during his hospitalization:  Acute asthma exacerbation: History of mild intermittent asthma.  Having congestion, cough, upper respiratory symptoms, wheezing at home. Was on  Solu-Medrol . Started on azithromycin.  Steroids changed to oral on discharge   Acute hypoxic respiratory failure: Present secondary to asthma exacerbation.  Does not use oxygen at home.  Currently on 3 L of oxygen.  COVID/flu swab negative.  Persistently desaturated on ambulation. Low suspicion for PE, D-dimer negative.  Qualified for home oxygen.  Follow-up with PCP in a week to check if he continues to require oxygen. Chest follow-up done today showed mild pulm vascular  congestion but his lungs are clear on auscultation, does not have peripheral edema or crackles   Leukocytosis:  Chest x-ray without pneumonia.  Recent history of steroids intake.  Started on azithromycin here for asthma exacerbation to cover for bronchitis.Improved   AKI: Baseline creatinine normal.  Resolved with IV fluids   History of diabetes type 2: Takes metformin, glipizide, Farxiga at home.  Hyperglycemic care most likely secondary to steroids.  Hemoglobin 6.4.   Hypertension:  Takes lisinopril, Coreg at home.  To be continued   History of coronary artery disease: Takes aspirin which has been resumed.  Denies any anginal symptoms.  Status post PCI several years ago.Marland Kitchen    Discharge Diagnoses:  Principal Problem:   Acute asthma exacerbation Active Problems:   History of diabetes mellitus   Ischemic cardiomyopathy   HLD (hyperlipidemia)   HTN (hypertension)    Discharge Instructions  Discharge Instructions     Diet - low sodium heart healthy   Complete by: As directed    Discharge instructions   Complete by: As directed    1)Please take prescribed medications as instructed 2)Follow up with your PCP in a week.Check if you need to be continued on oxygen at home at your PCP's office   Increase activity slowly   Complete by: As directed       Allergies as of 06/30/2022   No Known Allergies      Medication List     TAKE these medications    albuterol 108 (90 Base) MCG/ACT inhaler Commonly known as: VENTOLIN HFA Inhale 2 puffs into the lungs every 6 (six) hours as needed for wheezing or shortness of breath.  amLODipine 5 MG tablet Commonly known as: NORVASC TAKE 1 TABLET BY MOUTH DAILY   aspirin 81 MG tablet Take 81 mg by mouth daily.   carvedilol 25 MG tablet Commonly known as: COREG Take 1 tablet (25 mg total) by mouth 2 (two) times daily with a meal. TAKE 1 TABLET BY MOUTH  TWICE DAILY WITH MEALS What changed: additional instructions   cetirizine 10  MG tablet Commonly known as: ZyrTEC Allergy Take 1 tablet (10 mg total) by mouth at bedtime.   CO Q 10 PO Take 1 tablet by mouth daily.   Combivent Respimat 20-100 MCG/ACT Aers respimat Generic drug: Ipratropium-Albuterol Inhale 1 puff into the lungs every 6 (six) hours.   dapagliflozin propanediol 10 MG Tabs tablet Commonly known as: Farxiga Take 1 tablet (10 mg total) by mouth daily before breakfast.   fluticasone 50 MCG/ACT nasal spray Commonly known as: FLONASE Place 1 spray into both nostrils daily. Begin by using 2 sprays in each nare daily for 3 to 5 days, then decrease to 1 spray in each nare daily. What changed: additional instructions   glipiZIDE 5 MG tablet Commonly known as: GLUCOTROL Take 1 tablet (5 mg total) by mouth daily with breakfast.   guaiFENesin-dextromethorphan 100-10 MG/5ML syrup Commonly known as: ROBITUSSIN DM Take 10 mLs by mouth every 6 (six) hours as needed for cough.   lisinopril 40 MG tablet Commonly known as: ZESTRIL Take 1 tablet (40 mg total) by mouth daily.   meloxicam 15 MG tablet Commonly known as: MOBIC Take 1 tablet (15 mg total) by mouth daily.   metFORMIN 500 MG tablet Commonly known as: GLUCOPHAGE Take 1 tablet (500 mg total) by mouth 2 (two) times daily with a meal.   predniSONE 20 MG tablet Commonly known as: DELTASONE Take 2 tablets (40 mg total) by mouth daily for 5 days. Start taking on: July 01, 2022   promethazine-dextromethorphan 6.25-15 MG/5ML syrup Commonly known as: PROMETHAZINE-DM Take 5 mLs by mouth 4 (four) times daily as needed for cough.   rosuvastatin 20 MG tablet Commonly known as: CRESTOR Take 1 tablet (20 mg total) by mouth daily.   vitamin C 1000 MG tablet Take 2,000 mg by mouth daily.               Durable Medical Equipment  (From admission, onward)           Start     Ordered   06/30/22 1411  For home use only DME oxygen  Once       Question Answer Comment  Length of Need  Lifetime   Mode or (Route) Nasal cannula   Liters per Minute 3   Frequency Continuous (stationary and portable oxygen unit needed)   Oxygen delivery system Gas      06/30/22 1411            No Known Allergies  Consultations: None   Procedures/Studies: DG CHEST PORT 1 VIEW  Result Date: 06/30/2022 CLINICAL DATA:  Hypoxia EXAM: PORTABLE CHEST 1 VIEW COMPARISON:  06/28/2022 FINDINGS: Cardiac silhouette appears prominent. No pneumonia or pulmonary edema. There is vascular congestion. No pneumothorax or pleural effusion. IMPRESSION: Enlarged cardiac silhouette. Vascular congestion without focal consolidation. Electronically Signed   By: Sammie Bench M.D.   On: 06/30/2022 09:31   DG Chest Port 1 View  Result Date: 06/28/2022 CLINICAL DATA:  Shortness of breath, cough EXAM: PORTABLE CHEST 1 VIEW COMPARISON:  Portable exam 1245 hours compared to 06/28/2022 at 1047 hours FINDINGS: Normal  heart size, mediastinal contours, and pulmonary vascularity. Lungs clear. No pulmonary infiltrate, pleural effusion, or pneumothorax. Osseous structures unremarkable. IMPRESSION: No acute abnormalities. Electronically Signed   By: Lavonia Dana M.D.   On: 06/28/2022 13:10   DG Chest 2 View  Result Date: 06/28/2022 CLINICAL DATA:  Shortness of breath, cough, chest pain, wheezing, chest tightness, and runny nose for 2 weeks EXAM: CHEST - 2 VIEW COMPARISON:  06/19/2022 FINDINGS: Normal heart size, mediastinal contours, and pulmonary vascularity. Chronic increased markings in the lower lungs. No acute infiltrate, pleural effusion, or pneumothorax. Osseous structures unremarkable. IMPRESSION: No acute abnormalities. Electronically Signed   By: Lavonia Dana M.D.   On: 06/28/2022 10:56   DG Chest 2 View  Result Date: 06/19/2022 CLINICAL DATA:  Provided history: Decreased breath sounds, cough productive of green sputum. Additional history provided: chest congestion, abdominal pain, lack of appetite, shortness  of breath, diarrhea, decreased energy. EXAM: CHEST - 2 VIEW COMPARISON:  Prior chest radiographs 10/13/2017 and earlier. FINDINGS: Heart size within normal limits. No appreciable airspace consolidation or pulmonary edema. No evidence of pleural effusion or pneumothorax. Degenerative changes of the spine. IMPRESSION: No evidence of acute cardiopulmonary abnormality. Electronically Signed   By: Kellie Simmering D.O.   On: 06/19/2022 16:15      Subjective: Patient is and examined at bedside today.  Very eager to go home.  Does not want to stay anymore .  Long discussion had at the bedside that he continues to require oxygen and if he wants to go home, he needs to go home with oxygen.  He agreed to go home with oxygen.  Discharge Exam: Vitals:   06/30/22 0500 06/30/22 0746  BP: (!) 165/87 (!) 172/91  Pulse: 70 72  Resp: 18 16  Temp: 98.8 F (37.1 C) 98.5 F (36.9 C)  SpO2: 95% 94%   Vitals:   06/29/22 1946 06/29/22 1953 06/30/22 0500 06/30/22 0746  BP: (!) 160/88  (!) 165/87 (!) 172/91  Pulse: 79  70 72  Resp: '18  18 16  '$ Temp: 97.7 F (36.5 C)  98.8 F (37.1 C) 98.5 F (36.9 C)  TempSrc: Oral  Oral Oral  SpO2: 94% 94% 95% 94%  Weight:      Height:        General: Pt is alert, awake, not in acute distress Cardiovascular: RRR, S1/S2 +, no rubs, no gallops Respiratory: CTA bilaterally, no wheezing, no rhonchi Abdominal: Soft, NT, ND, bowel sounds + Extremities: no edema, no cyanosis    The results of significant diagnostics from this hospitalization (including imaging, microbiology, ancillary and laboratory) are listed below for reference.     Microbiology: Recent Results (from the past 240 hour(s))  Resp panel by RT-PCR (RSV, Flu A&B, Covid) Anterior Nasal Swab     Status: None   Collection Time: 06/28/22 12:39 PM   Specimen: Anterior Nasal Swab  Result Value Ref Range Status   SARS Coronavirus 2 by RT PCR NEGATIVE NEGATIVE Final    Comment: (NOTE) SARS-CoV-2 target nucleic  acids are NOT DETECTED.  The SARS-CoV-2 RNA is generally detectable in upper respiratory specimens during the acute phase of infection. The lowest concentration of SARS-CoV-2 viral copies this assay can detect is 138 copies/mL. A negative result does not preclude SARS-Cov-2 infection and should not be used as the sole basis for treatment or other patient management decisions. A negative result may occur with  improper specimen collection/handling, submission of specimen other than nasopharyngeal swab, presence of viral mutation(s) within  the areas targeted by this assay, and inadequate number of viral copies(<138 copies/mL). A negative result must be combined with clinical observations, patient history, and epidemiological information. The expected result is Negative.  Fact Sheet for Patients:  EntrepreneurPulse.com.au  Fact Sheet for Healthcare Providers:  IncredibleEmployment.be  This test is no t yet approved or cleared by the [place] FDA and  has been authorized for detection and/or diagnosis of SARS-CoV-2 by FDA under an Emergency Use Authorization (EUA). This EUA will remain  in effect (meaning this test can be used) for the duration of the COVID-19 declaration under Section 564(b)(1) of the Act, 21 U.S.C.section 360bbb-3(b)(1), unless the authorization is terminated  or revoked sooner.       Influenza A by PCR NEGATIVE NEGATIVE Final   Influenza B by PCR NEGATIVE NEGATIVE Final    Comment: (NOTE) The Xpert Xpress SARS-CoV-2/FLU/RSV plus assay is intended as an aid in the diagnosis of influenza from Nasopharyngeal swab specimens and should not be used as a sole basis for treatment. Nasal washings and aspirates are unacceptable for Xpert Xpress SARS-CoV-2/FLU/RSV testing.  Fact Sheet for Patients: EntrepreneurPulse.com.au  Fact Sheet for Healthcare Providers: IncredibleEmployment.be  This  test is not yet approved or cleared by the [place] FDA and has been authorized for detection and/or diagnosis of SARS-CoV-2 by FDA under an Emergency Use Authorization (EUA). This EUA will remain in effect (meaning this test can be used) for the duration of the COVID-19 declaration under Section 564(b)(1) of the Act, 21 U.S.C. section 360bbb-3(b)(1), unless the authorization is terminated or revoked.     Resp Syncytial Virus by PCR NEGATIVE NEGATIVE Final    Comment: (NOTE) Fact Sheet for Patients: EntrepreneurPulse.com.au  Fact Sheet for Healthcare Providers: IncredibleEmployment.be  This test is not yet approved or cleared by the [place] FDA and has been authorized for detection and/or diagnosis of SARS-CoV-2 by FDA under an Emergency Use Authorization (EUA). This EUA will remain in effect (meaning this test can be used) for the duration of the COVID-19 declaration under Section 564(b)(1) of the Act, 21 U.S.C. section 360bbb-3(b)(1), unless the authorization is terminated or revoked.  Performed at [facility], [place] [address] [place], [place] [address]      Labs: BNP (last 3 results) No results for input(s): "BNP" in the last 8760 hours. Basic Metabolic Panel: Recent Labs  Lab 06/28/22 1234 06/29/22 0330  NA 137 139  K 4.0 4.1  CL 101 104  CO2 22 21*  GLUCOSE 148* 189*  BUN 15 25*  CREATININE 1.33* 1.08  CALCIUM 9.2 8.8*   Liver Function Tests: Recent Labs  Lab 06/28/22 1234  AST 18  ALT 17  ALKPHOS 61  BILITOT 1.5*  PROT 7.4  ALBUMIN 3.8   No results for input(s): "LIPASE", "AMYLASE" in the last 168 hours. No results for input(s): "AMMONIA" in the last 168 hours. CBC: Recent Labs  Lab 06/28/22 1234 06/29/22 0330  WBC 19.5* 12.1*  HGB 16.5 13.9  HCT 48.2 40.8  MCV 85.9 86.6  PLT 255 242   Cardiac Enzymes: No results for input(s): "CKTOTAL", "CKMB", "CKMBINDEX", "TROPONINI" in the last 168  hours. BNP: Invalid input(s): "POCBNP" CBG: Recent Labs  Lab 06/29/22 1233 06/29/22 1638 06/29/22 2302 06/30/22 0754 06/30/22 1137  GLUCAP 306* 250* 297* 200* 380*   D-Dimer Recent Labs    06/30/22 1015  DDIMER <0.27   Hgb A1c Recent Labs    06/28/22 1758  HGBA1C 6.4*   Lipid Profile  No results for input(s): "CHOL", "HDL", "LDLCALC", "TRIG", "CHOLHDL", "LDLDIRECT" in the last 72 hours. Thyroid function studies No results for input(s): "TSH", "T4TOTAL", "T3FREE", "THYROIDAB" in the last 72 hours.  Invalid input(s): "FREET3" Anemia work up No results for input(s): "VITAMINB12", "FOLATE", "FERRITIN", "TIBC", "IRON", "RETICCTPCT" in the last 72 hours. Urinalysis    Component Value Date/Time   COLORURINE Yellow 04/18/2010 0908   APPEARANCEUR CLEAR 04/18/2010 0908   LABSPEC <=1.005 04/18/2010 0908   PHURINE 6.0 04/18/2010 0908   GLUCOSEU NEGATIVE 04/18/2010 0908   BILIRUBINUR NEGATIVE 04/18/2010 0908   KETONESUR NEGATIVE 04/18/2010 0908   UROBILINOGEN 0.2 04/18/2010 0908   NITRITE NEGATIVE 04/18/2010 0908   LEUKOCYTESUR NEGATIVE 04/18/2010 0908   Sepsis Labs Recent Labs  Lab 06/28/22 1234 06/29/22 0330  WBC 19.5* 12.1*   Microbiology Recent Results (from the past 240 hour(s))  Resp panel by RT-PCR (RSV, Flu A&B, Covid) Anterior Nasal Swab     Status: None   Collection Time: 06/28/22 12:39 PM   Specimen: Anterior Nasal Swab  Result Value Ref Range Status   SARS Coronavirus 2 by RT PCR NEGATIVE NEGATIVE Final    Comment: (NOTE) SARS-CoV-2 target nucleic acids are NOT DETECTED.  The SARS-CoV-2 RNA is generally detectable in upper respiratory specimens during the acute phase of infection. The lowest concentration of SARS-CoV-2 viral copies this assay can detect is 138 copies/mL. A negative result does not preclude SARS-Cov-2 infection and should not be used as the sole basis for treatment or other patient management decisions. A negative result may occur  with  improper specimen collection/handling, submission of specimen other than nasopharyngeal swab, presence of viral mutation(s) within the areas targeted by this assay, and inadequate number of viral copies(<138 copies/mL). A negative result must be combined with clinical observations, patient history, and epidemiological information. The expected result is Negative.  Fact Sheet for Patients:  EntrepreneurPulse.com.au  Fact Sheet for Healthcare Providers:  IncredibleEmployment.be  This test is no t yet approved or cleared by the [place] FDA and  has been authorized for detection and/or diagnosis of SARS-CoV-2 by FDA under an Emergency Use Authorization (EUA). This EUA will remain  in effect (meaning this test can be used) for the duration of the COVID-19 declaration under Section 564(b)(1) of the Act, 21 U.S.C.section 360bbb-3(b)(1), unless the authorization is terminated  or revoked sooner.       Influenza A by PCR NEGATIVE NEGATIVE Final   Influenza B by PCR NEGATIVE NEGATIVE Final    Comment: (NOTE) The Xpert Xpress SARS-CoV-2/FLU/RSV plus assay is intended as an aid in the diagnosis of influenza from Nasopharyngeal swab specimens and should not be used as a sole basis for treatment. Nasal washings and aspirates are unacceptable for Xpert Xpress SARS-CoV-2/FLU/RSV testing.  Fact Sheet for Patients: EntrepreneurPulse.com.au  Fact Sheet for Healthcare Providers: IncredibleEmployment.be  This test is not yet approved or cleared by the [place] FDA and has been authorized for detection and/or diagnosis of SARS-CoV-2 by FDA under an Emergency Use Authorization (EUA). This EUA will remain in effect (meaning this test can be used) for the duration of the COVID-19 declaration under Section 564(b)(1) of the Act, 21 U.S.C. section 360bbb-3(b)(1), unless the authorization is terminated  or revoked.     Resp Syncytial Virus by PCR NEGATIVE NEGATIVE Final    Comment: (NOTE) Fact Sheet for Patients: EntrepreneurPulse.com.au  Fact Sheet for Healthcare Providers: IncredibleEmployment.be  This test is not yet approved or cleared by the [place] FDA and has been  authorized for detection and/or diagnosis of SARS-CoV-2 by FDA under an Emergency Use Authorization (EUA). This EUA will remain in effect (meaning this test can be used) for the duration of the COVID-19 declaration under Section 564(b)(1) of the Act, 21 U.S.C. section 360bbb-3(b)(1), unless the authorization is terminated or revoked.  Performed at [facility], [place] [address], [place], [place] [address]     Please note: You were cared for by a hospitalist during your hospital stay. Once you are discharged, your primary care physician will handle any further medical issues. Please note that NO REFILLS for any discharge medications will be authorized once you are discharged, as it is imperative that you return to your primary care physician (or establish a relationship with a primary care physician if you do not have one) for your post hospital discharge needs so that they can reassess your need for medications and monitor your lab values.    Time coordinating discharge: 40 minutes  SIGNED:   Shelly Coss, MD  [facility] 06/30/2022, 2:15 PM Pager [phone]  If 7PM-7AM, please contact night-coverage [email or web address] Password [email or web address]

## 2022-06-30 NOTE — TOC Transition Note (Signed)
Transition of Care [facility]) - CM/SW Discharge Note   Patient Details  Name: Scott Dodson MRN: [id number] Date of Birth: 04/05/1951  [facility] CM/SW Contact:  Carles Collet, RN Phone Number: 06/30/2022, 11:35 AM   Clinical Narrative:     Notified patient will need home oxygen. Sats are complete, awaiting DME order. Notified [employer], anticipate DC today         Patient Goals and CMS Choice      Discharge Placement                         Discharge Plan and Services Additional resources added to the After Visit Summary for                  DME Arranged: Oxygen DME Agency: [facility] Date DME Agency Contacted: 06/30/22 Time DME Agency Contacted: [id number] Representative spoke with at DME Agency: [place] ([place]) Interventions SDOH Screenings   Food Insecurity: No Food Insecurity (06/28/2022)  Housing: Low Risk  (06/28/2022)  Transportation Needs: No Transportation Needs (06/28/2022)  Utilities: Not At Risk (06/28/2022)  Alcohol Screen: Low Risk  (08/11/2019)  Depression (PHQ2-9): Low Risk  (08/17/2020)  Financial Resource Strain: Low Risk  (10/14/2018)  Physical Activity: Sufficiently Active (10/14/2018)  Social Connections: Moderately Integrated (10/14/2018)  Stress: No Stress Concern Present (10/14/2018)  Tobacco Use: Medium Risk (06/28/2022)     Readmission Risk Interventions     No data to display

## 2022-06-30 NOTE — Progress Notes (Signed)
SATURATION QUALIFICATIONS: (This note is used to comply with regulatory documentation for home oxygen)  Patient Saturations on Room Air at Rest =89%  Patient Saturations on Room Air while Ambulating = 85%  Patient Saturations on 4 Liters of oxygen while Ambulating = 91%  Please briefly explain why patient needs home oxygen: Pt oxygen levels drop below 90% while ambulating and at rest.

## 2022-07-02 ENCOUNTER — Telehealth: Payer: Self-pay

## 2022-07-02 ENCOUNTER — Ambulatory Visit (INDEPENDENT_AMBULATORY_CARE_PROVIDER_SITE_OTHER): Payer: Medicare Other | Admitting: Sports Medicine

## 2022-07-02 VITALS — BP 110/80 | HR 38 | Ht 69.0 in | Wt 196.0 lb

## 2022-07-02 DIAGNOSIS — G8929 Other chronic pain: Secondary | ICD-10-CM

## 2022-07-02 DIAGNOSIS — M545 Low back pain, unspecified: Secondary | ICD-10-CM | POA: Diagnosis not present

## 2022-07-02 DIAGNOSIS — R001 Bradycardia, unspecified: Secondary | ICD-10-CM | POA: Diagnosis not present

## 2022-07-02 DIAGNOSIS — M25552 Pain in left hip: Secondary | ICD-10-CM

## 2022-07-02 NOTE — Patient Instructions (Signed)
Good to see you   

## 2022-07-02 NOTE — Patient Outreach (Signed)
  Care Coordination [facility] Note Transition Care Management Unsuccessful Follow-up Telephone Call  Date of discharge and from where:  06/30/22 [facility] dx acuter asthma  Attempts:  1st Attempt  Reason for unsuccessful TCM follow-up call:  Left voice message Peter Garter RN, [facility], [place] Management [phone]

## 2022-07-03 ENCOUNTER — Encounter: Payer: Self-pay | Admitting: Gastroenterology

## 2022-07-03 ENCOUNTER — Ambulatory Visit: Payer: Medicare Other | Admitting: Gastroenterology

## 2022-07-03 ENCOUNTER — Telehealth: Payer: Self-pay | Admitting: *Deleted

## 2022-07-03 ENCOUNTER — Encounter: Payer: Self-pay | Admitting: *Deleted

## 2022-07-03 VITALS — BP 140/64 | HR 50 | Ht 69.0 in | Wt 194.0 lb

## 2022-07-03 DIAGNOSIS — R1319 Other dysphagia: Secondary | ICD-10-CM | POA: Diagnosis not present

## 2022-07-03 NOTE — Patient Instructions (Signed)
_______________________________________________________  If you are age 70 or older, your body mass index should be between 23-30. Your Body mass index is 28.65 kg/m. If this is out of the aforementioned range listed, please consider follow up with your Primary Care Provider.  If you are age 6 or younger, your body mass index should be between 19-25. Your Body mass index is 28.65 kg/m. If this is out of the aformentioned range listed, please consider follow up with your Primary Care Provider.   You have been scheduled for an endoscopy. Please follow written instructions given to you at your visit today. If you use inhalers (even only as needed), please bring them with you on the day of your procedure.   The [place] [facility] providers would like to encourage you to use [facility] to communicate with providers for non-urgent requests or questions.  Due to long hold times on the telephone, sending your provider a message by [facility] may be a faster and more efficient way to get a response.  Please allow 48 business hours for a response.  Please remember that this is for non-urgent requests.   It was a pleasure to see you today!  Thank you for trusting me with your gastrointestinal care!

## 2022-07-03 NOTE — Patient Outreach (Signed)
Care Coordination [facility] Note Transition Care Management Follow-up Telephone Call Date of discharge and from where: Sunday, 06/30/22 Scott Dodson; asthma exacerbation with viral URI/ cough; discharged on new home O2 How have you been since you were released from the hospital? "I am doing just fine, feel like I am getting better every day.  I stopped using the home O2 because it made my nose bleed and when I checked my home O2 levels, they were always up in the upper 90's without using the home O2.  I picked up my prednisone and almost through taking it.  They told me they were going to send me home on an antibiotic, but they never did prescribe it.  Yes, please do have someone call me to schedule that appointment with Dr. Mitchel Honour, I want his clearance on this home O2 so I can ask the company to come pick it back up" Any questions or concerns? No  Items Reviewed: Did the pt receive and understand the discharge instructions provided? Yes  Medications obtained and verified? Yes - verified patient obtained and is taking new medications prescribed post-hospital discharge; patient adamantly declines full medication review stating, "nothing changed and I don't have any questions or concerns, so I don't see the point in reviewing all of them." Other? No  Any new allergies since your discharge? No  Dietary orders reviewed? Yes Do you have support at home? Yes  spouse assisting as needed with ADL's/ iADL's; patient reports he is essentially independent in all self-care  Home Care and Equipment/Supplies: Were home health services ordered? no If so, what is the name of the agency? N/A  Has the agency set up a time to come to the patient's home? not applicable Were any new equipment or medical supplies ordered?  Yes: home O2 What is the name of the medical supply agency? [employer] Were you able to get the supplies/equipment? yes Do you have any questions related to the use of the equipment or supplies?  No  Functional Questionnaire: (I = Independent and D = Dependent) ADLs: I  Bathing/Dressing- I  Meal Prep- I  Eating- I  Maintaining continence- I  Transferring/Ambulation- I  Managing Meds- I  Follow up appointments reviewed:  PCP Hospital f/u appt confirmed? No  Scheduled to see - on - @ - requested facilitation for scheduling with PCP from scheduling care guide team [facility] f/u appt confirmed? No  Scheduled to see - on - @ - Are transportation arrangements needed? No  If their condition worsens, is the pt aware to call PCP or go to the Emergency Dept.? Yes Was the patient provided with contact information for the PCP's office or ED? No- patient declined; states he already has contact information for all care providers Was to pt encouraged to call back with questions or concerns? Yes  SDOH assessments and interventions completed:   Yes SDOH Interventions Today    Flowsheet Row Most Recent Value  SDOH Interventions   Food Insecurity Interventions Intervention Not Indicated  Transportation Interventions Intervention Not Indicated  [patient drives self,  spouse assists as indicated]      Care Coordination Interventions:  PCP follow up appointment requested Provided education around safe use of home O2; process to have need for home O2 assessed/ importance of prompt follow up with PCP    Encounter Outcome:  Pt. Visit Completed    Oneta Rack, RN, BSN, CCRN Alumnus RN CM Care Coordination/ Transition of [place] Management [phone]: direct office

## 2022-07-03 NOTE — Progress Notes (Signed)
  Care Coordination  Note  07/03/2022 Name: Yavuz Kirby MRN: [id number] DOB: 1951/04/13  Jayden Kratochvil is a 72 y.o. year old primary care patient of Sagardia, Ines Bloomer, MD. I reached out to Irven Shelling by phone today to assist with scheduling a follow up appointment. Irven Shelling verbally consented to my assistance.       Follow up plan: Hospital Follow Up appointment scheduled with (Dr Franky Macho) on (07/10/2022) at (940).  Julian Hy, [place] Direct Dial: [phone]

## 2022-07-03 NOTE — Progress Notes (Signed)
[facility] Consult Note:  History: Scott Dodson 07/03/2022  Referring provider: Horald Pollen, MD  Reason for consult/chief complaint: Dysphagia (Having trouble swallowing with any food, gets caught in lower throat. No issues drinking )   Subjective  HPI:  This is a pleasant 72 year old man referred by primary care for dysphagia. Scott Dodson saw primary care 05/21/2022 describing intermittent solid food dysphagia.  No additional testing performed. He was admitted to the hospital from 06/28/2022 to 06/30/2022 for asthma exacerbation with ambulatory hypoxia.  No pneumonia on chest x-ray.  Treated with steroids, bronchodilators and azithromycin.  Discharged on home oxygen.  He tells me today he only needed the oxygen for 24 to 48 hours, he is room air saturation is now consistently well above 94% as he uses a saturation probe at home.  Still has a nonproductive cough.  Scott Dodson describes about 2 years of intermittent solid food dysphagia where food feels stuck up in the neck.  Liquids passed without difficulty, no nausea or vomiting or weight loss.  Denies change in bowel habits or rectal bleeding. For screening colonoscopy with me May 2020 -diminutive tubular adenoma, 7-year recall recommended. ROS:  Review of Systems  Constitutional:  Negative for appetite change and unexpected weight change.  HENT:  Negative for mouth sores and voice change.   Eyes:  Negative for pain and redness.  Respiratory:  Positive for cough. Negative for shortness of breath.   Cardiovascular:  Negative for chest pain and palpitations.  Genitourinary:  Negative for dysuria and hematuria.  Musculoskeletal:  Negative for arthralgias and myalgias.  Skin:  Negative for pallor and rash.  Neurological:  Negative for weakness and headaches.  Hematological:  Negative for adenopathy.     Past Medical History: Past Medical History:  Diagnosis Date   Allergy    Asthma    Coronary artery disease     s/p lateral MI 4 /11, s/p Promus DES OM2   Diabetes mellitus without complication (HCC)    Heart murmur    Hyperlipidemia    Hypertension    Myocardial infarct [facility]) 2011     Past Surgical History: Past Surgical History:  Procedure Laterality Date   ADENOIDECTOMY       2011   CORONARY STENT PLACEMENT     TONSILLECTOMY     TYMPANIC MEMBRANE REPAIR Right 2004   VASECTOMY     MI 2011 with stent placement  Family History: Family History  Problem Relation Age of Onset   Heart disease Mother    Hypertension Mother    Heart disease Father    Hypertension Father    Heart disease Brother    Heart disease Sister    Colon cancer Maternal Grandfather 92   Colon polyps Neg Hx    Esophageal cancer Neg Hx    Stomach cancer Neg Hx    Rectal cancer Neg Hx     Social History: Social History   Socioeconomic History   Marital status: Married    Spouse name: Not on file   Number of children: 3   Years of education: Not on file   Highest education level: Not on file  Occupational History   Occupation: Charity fundraiser   Occupation: retired  Tobacco Use   Smoking status: Former    Packs/day: 0.50    Years: 5.00    Total pack years: 2.50    Types: Cigarettes    Quit date: 10/14/1974    Years since quitting: 47.7   Smokeless  tobacco: Never  Vaping Use   Vaping Use: Never used  Substance and Sexual Activity   Alcohol use: Yes    Alcohol/week: 5.0 standard drinks of alcohol    Types: 5 Standard drinks or equivalent per week   Drug use: No   Sexual activity: Yes  Other Topics Concern   Not on file  Social History Narrative   Occupation- Photographer    Former -tobacco -stopped 1979   Married ,3 children     Alcohol use - yes , social    Drug use - no   Regular exercise- yes    Social Determinants of Health   Financial Resource Strain: Low Risk  (10/14/2018)   Overall Financial Resource Strain (CARDIA)    Difficulty of Paying Living Expenses:  Not hard at all  Food Insecurity: No Food Insecurity (06/28/2022)   Hunger Vital Sign    Worried About Running Out of Food in the Last Year: Never true    [place] in the Last Year: Never true  Transportation Needs: No Transportation Needs (06/28/2022)   PRAPARE - Hydrologist (Medical): No    Lack of Transportation (Non-Medical): No  Physical Activity: Sufficiently Active (10/14/2018)   Exercise Vital Sign    Days of Exercise per Week: 3 days    Minutes of Exercise per Session: 100 min  Stress: No Stress Concern Present (10/14/2018)   [place]    Feeling of Stress : Not at all  Social Connections: Moderately Integrated (10/14/2018)   Social Connection and Isolation Panel [NHANES]    Frequency of Communication with Friends and Family: Once a week    Frequency of Social Gatherings with Friends and Family: Once a week    Attends Religious Services: More than 4 times per year    Active Member of [employer] or Organizations: Yes    Attends Archivist Meetings: 1 to 4 times per year    Marital Status: Married    Allergies: No Known Allergies  Outpatient Meds: Current Outpatient Medications  Medication Sig Dispense Refill   albuterol (VENTOLIN HFA) 108 (90 Base) MCG/ACT inhaler Inhale 2 puffs into the lungs every 6 (six) hours as needed for wheezing or shortness of breath. 8 g 2   amLODipine (NORVASC) 5 MG tablet TAKE 1 TABLET BY MOUTH DAILY 30 tablet 11   Ascorbic Acid (VITAMIN C) 1000 MG tablet Take 2,000 mg by mouth daily.     aspirin 81 MG tablet Take 81 mg by mouth daily.     carvedilol (COREG) 25 MG tablet Take 1 tablet (25 mg total) by mouth 2 (two) times daily with a meal. TAKE 1 TABLET BY MOUTH  TWICE DAILY WITH MEALS (Patient taking differently: Take 25 mg by mouth 2 (two) times daily with a meal.) 180 tablet 3   cetirizine (ZYRTEC ALLERGY) 10 MG tablet Take 1 tablet (10  mg total) by mouth at bedtime. 90 tablet 1   Coenzyme Q10 (CO Q 10 PO) Take 1 tablet by mouth daily.     dapagliflozin propanediol (FARXIGA) 10 MG TABS tablet Take 1 tablet (10 mg total) by mouth daily before breakfast. 90 tablet 3   fluticasone (FLONASE) 50 MCG/ACT nasal spray Place 1 spray into both nostrils daily. Begin by using 2 sprays in each nare daily for 3 to 5 days, then decrease to 1 spray in each nare daily. (Patient taking differently: Place 1  spray into both nostrils daily.) 15.8 mL 2   glipiZIDE (GLUCOTROL) 5 MG tablet Take 1 tablet (5 mg total) by mouth daily with breakfast. 90 tablet 3   guaiFENesin-dextromethorphan (ROBITUSSIN DM) 100-10 MG/5ML syrup Take 10 mLs by mouth every 6 (six) hours as needed for cough. 118 mL 1   Ipratropium-Albuterol (COMBIVENT RESPIMAT) 20-100 MCG/ACT AERS respimat Inhale 1 puff into the lungs every 6 (six) hours. 1 each 0   lisinopril (ZESTRIL) 40 MG tablet Take 1 tablet (40 mg total) by mouth daily. 90 tablet 3   meloxicam (MOBIC) 15 MG tablet Take 1 tablet (15 mg total) by mouth daily. 30 tablet 0   metFORMIN (GLUCOPHAGE) 500 MG tablet Take 1 tablet (500 mg total) by mouth 2 (two) times daily with a meal. 180 tablet 3   predniSONE (DELTASONE) 20 MG tablet Take 2 tablets (40 mg total) by mouth daily for 5 days. 10 tablet 0   promethazine-dextromethorphan (PROMETHAZINE-DM) 6.25-15 MG/5ML syrup Take 5 mLs by mouth 4 (four) times daily as needed for cough. 60 mL 0   rosuvastatin (CRESTOR) 20 MG tablet Take 1 tablet (20 mg total) by mouth daily. 90 tablet 3   No current facility-administered medications for this visit.      ___________________________________________________________________ Objective   Exam:  BP (!) 140/64   Pulse (!) 50   Ht '5\' 9"'$  (1.753 m)   Wt 194 lb (88 kg)   SpO2 97%   BMI 28.65 kg/m  Wt Readings from Last 3 Encounters:  07/03/22 194 lb (88 kg)  07/02/22 196 lb (88.9 kg)  06/28/22 207 lb 14.3 oz (94.3 kg)  O2Sat 97%  room air  General: Well-appearing, breathing comfortably on room air.  Intermittent cough. Eyes: sclera anicteric, no redness ENT: oral mucosa moist without lesions, no cervical or supraclavicular lymphadenopathy CV: Regular without murmur, no JVD, no peripheral edema Resp: clear to auscultation bilaterally, normal RR and effort noted.  No wheezing GI: soft, no tenderness, with active bowel sounds. No guarding or palpable organomegaly noted. Skin; warm and dry, no rash or jaundice noted Neuro: awake, alert and oriented x 3. Normal gross motor function and fluent speech    Radiologic Studies:  Last echocardiogram April 2011 LVEF 40 to 45% with regional myocardial hypokinesis, though this was in the setting of acute MI.  Assessment: Encounter Diagnosis  Name Primary?   Esophageal dysphagia Yes    Intermittent over years, sounds likely ring/web or stricture, he does not describe chronic heartburn or regurgitation.  Less likely EOE or neoplasia.  Plan:  Upper endoscopy with probable endoscopic dilation.  He was agreeable after discussion of procedure and risks.  The benefits and risks of the planned procedure were described in detail with the patient or (when appropriate) their health care proxy.  Risks were outlined as including, but not limited to, bleeding, infection, perforation, adverse medication reaction leading to cardiac or pulmonary decompensation, pancreatitis (if ERCP).  The limitation of incomplete mucosal visualization was also discussed.  No guarantees or warranties were given.  We have scheduled at least a few weeks from now to allow more recovery from his recent respiratory infection.  Thank you for the courtesy of this consult.  Please call me with any questions or concerns.  Scott Dodson  CC: Referring provider noted above

## 2022-07-05 ENCOUNTER — Ambulatory Visit: Payer: Medicare Other

## 2022-07-10 ENCOUNTER — Ambulatory Visit (INDEPENDENT_AMBULATORY_CARE_PROVIDER_SITE_OTHER): Payer: Medicare Other | Admitting: Emergency Medicine

## 2022-07-10 ENCOUNTER — Encounter: Payer: Self-pay | Admitting: Emergency Medicine

## 2022-07-10 VITALS — BP 128/76 | HR 55 | Temp 98.7°F | Ht 69.0 in | Wt 194.0 lb

## 2022-07-10 DIAGNOSIS — E1169 Type 2 diabetes mellitus with other specified complication: Secondary | ICD-10-CM

## 2022-07-10 DIAGNOSIS — I255 Ischemic cardiomyopathy: Secondary | ICD-10-CM

## 2022-07-10 DIAGNOSIS — I152 Hypertension secondary to endocrine disorders: Secondary | ICD-10-CM

## 2022-07-10 DIAGNOSIS — E1159 Type 2 diabetes mellitus with other circulatory complications: Secondary | ICD-10-CM | POA: Diagnosis not present

## 2022-07-10 DIAGNOSIS — J4521 Mild intermittent asthma with (acute) exacerbation: Secondary | ICD-10-CM

## 2022-07-10 DIAGNOSIS — Z09 Encounter for follow-up examination after completed treatment for conditions other than malignant neoplasm: Secondary | ICD-10-CM

## 2022-07-10 DIAGNOSIS — E785 Hyperlipidemia, unspecified: Secondary | ICD-10-CM | POA: Diagnosis not present

## 2022-07-10 NOTE — Assessment & Plan Note (Signed)
Stable.  Diet and nutrition discussed.  Continue rosuvastatin 20 mg daily.  

## 2022-07-10 NOTE — Assessment & Plan Note (Signed)
No signs of congestive heart failure.  Stable. Continue blood pressure control along with beta-blocker carvedilol 25 mg twice a day

## 2022-07-10 NOTE — Assessment & Plan Note (Signed)
Well-controlled.  No need for oxygen supplementation.  Continue Combivent twice a day.

## 2022-07-10 NOTE — Assessment & Plan Note (Addendum)
Well-controlled hypertension.  Continue lisinopril 40 mg, amlodipine 5 mg daily and carvedilol 25 mg twice a day BP Readings from Last 3 Encounters:  07/10/22 128/76  07/03/22 (!) 140/64  07/02/22 110/80  Well-controlled diabetes with hemoglobin A1c of 6.4 Lab Results  Component Value Date   HGBA1C 6.4 (H) 06/28/2022  Continue Farxiga 10 mg daily, glipizide 5 mg daily and metformin 500 mg twice a day Cardiovascular risks associated with hypertension and diabetes discussed Diet and nutrition discussed

## 2022-07-10 NOTE — Progress Notes (Signed)
Scott Dodson 72 y.o.   Chief Complaint  Patient presents with   Follow-up    HISTORY OF PRESENT ILLNESS: This is a 72 y.o. male here for hospital discharge follow-up.  Admitted on 06/28/2022 with asthma/COPD exacerbation and discharged on 06/30/2022. Doing well.  Has no complaints or medical concerns today. Was discharged on oxygen but has not had to use it yet.  Does not think he needs it. Hospital discharge summary as follows: Physician Discharge Summary  Scott Dodson IDP:[id number] DOB: 12/21/1950 DOA: 06/28/2022   PCP: Horald Pollen, MD   Admit date: 06/28/2022 Discharge date: 06/30/2022   Admitted From: Home Disposition:  Home   Discharge Condition:Stable CODE STATUS:FULL Diet recommendation: Heart Healthy    Brief/Interim Summary:    Scott Dodson is a 72 y.o. male with medical history significant of hypertension, diabetes type 2, hyperlipidemia, coronary artery disease s/p PCI, mild intermittent asthma who presented from home with complaints of 2 weeks history of chest tightness, shortness of breath, nonproductive cough, wheezing.  Patient has been diagnosed with asthma in the past  but does not get frequent asthma attacks.  Last attack was on 1993.He regularly checks his oxygenation at home and found that it does drop to the range of 88%. He is a past smoker.  On presentation, he was hypoxic, wheezing.  Patient was admitted for the management of asthma exacerbation.  He clinically feels much better but he continues to require oxygen so call for for home oxygen.  I have recommended him to follow-up with his PCP in a week and check if he continues to need oxygen at home.  DME oxygen arranged.  Medically stable for discharge.     Following problems were addressed during his hospitalization:   Acute asthma exacerbation: History of mild intermittent asthma.  Having congestion, cough, upper respiratory symptoms, wheezing at home. Was on  Solu-Medrol . Started on  azithromycin.  Steroids changed to oral on discharge   Acute hypoxic respiratory failure: Present secondary to asthma exacerbation.  Does not use oxygen at home.  Currently on 3 L of oxygen.  COVID/flu swab negative.  Persistently desaturated on ambulation. Low suspicion for PE, D-dimer negative.  Qualified for home oxygen.  Follow-up with PCP in a week to check if he continues to require oxygen. Chest follow-up done today showed mild pulm vascular congestion but his lungs are clear on auscultation, does not have peripheral edema or crackles   Leukocytosis:  Chest x-ray without pneumonia.  Recent history of steroids intake.  Started on azithromycin here for asthma exacerbation to cover for bronchitis.Improved   AKI: Baseline creatinine normal.  Resolved with IV fluids   History of diabetes type 2: Takes metformin, glipizide, Farxiga at home.  Hyperglycemic care most likely secondary to steroids.  Hemoglobin 6.4.   Hypertension:  Takes lisinopril, Coreg at home.  To be continued   History of coronary artery disease: Takes aspirin which has been resumed.  Denies any anginal symptoms.  Status post PCI several years ago.Marland Kitchen     Discharge Diagnoses:  Principal Problem:   Acute asthma exacerbation Active Problems:   History of diabetes mellitus   Ischemic cardiomyopathy   HLD (hyperlipidemia)   HTN (hypertension)       Discharge Instructions   Discharge Instructions       Diet - low sodium heart healthy   Complete by: As directed      Discharge instructions   Complete by: As directed      1)Please take  prescribed medications as instructed 2)Follow up with your PCP in a week.Check if you need to be continued on oxygen at home at your PCP's office    Increase activity slowly   Complete by: As directed    HPI   Prior to Admission medications   Medication Sig Start Date End Date Taking? Authorizing Provider  albuterol (VENTOLIN HFA) 108 (90 Base) MCG/ACT inhaler Inhale 2 puffs into  the lungs every 6 (six) hours as needed for wheezing or shortness of breath. 06/30/22  Yes Shelly Coss, MD  amLODipine (NORVASC) 5 MG tablet TAKE 1 TABLET BY MOUTH DAILY 06/05/22  Yes Marieke Lubke, Ines Bloomer, MD  Ascorbic Acid (VITAMIN C) 1000 MG tablet Take 2,000 mg by mouth daily.   Yes [provider]  aspirin 81 MG tablet Take 81 mg by mouth daily.   Yes [provider]  carvedilol (COREG) 25 MG tablet Take 1 tablet (25 mg total) by mouth 2 (two) times daily with a meal. TAKE 1 TABLET BY MOUTH  TWICE DAILY WITH MEALS Patient taking differently: Take 25 mg by mouth 2 (two) times daily with a meal. 05/21/22 05/16/23 Yes Baneen Wieseler, Ines Bloomer, MD  cetirizine (ZYRTEC ALLERGY) 10 MG tablet Take 1 tablet (10 mg total) by mouth at bedtime. 06/19/22 12/16/22 Yes Lynden Oxford Scales, PA-C  Coenzyme Q10 (CO Q 10 PO) Take 1 tablet by mouth daily.   Yes [provider]  dapagliflozin propanediol (FARXIGA) 10 MG TABS tablet Take 1 tablet (10 mg total) by mouth daily before breakfast. 05/21/22 05/16/23 Yes Joee Iovine, Ines Bloomer, MD  fluticasone [facility]) 50 MCG/ACT nasal spray Place 1 spray into both nostrils daily. Begin by using 2 sprays in each nare daily for 3 to 5 days, then decrease to 1 spray in each nare daily. Patient taking differently: Place 1 spray into both nostrils daily. 06/19/22  Yes Lynden Oxford Scales, PA-C  glipiZIDE (GLUCOTROL) 5 MG tablet Take 1 tablet (5 mg total) by mouth daily with breakfast. 05/21/22  Yes Morrisa Aldaba, Ines Bloomer, MD  guaiFENesin-dextromethorphan (ROBITUSSIN DM) 100-10 MG/5ML syrup Take 10 mLs by mouth every 6 (six) hours as needed for cough. 06/30/22  Yes Adhikari, Tamsen Meek, MD  Ipratropium-Albuterol (COMBIVENT RESPIMAT) 20-100 MCG/ACT AERS respimat Inhale 1 puff into the lungs every 6 (six) hours. 06/19/22 07/19/22 Yes Lynden Oxford Scales, PA-C  lisinopril (ZESTRIL) 40 MG tablet Take 1 tablet (40 mg total) by mouth daily. 05/21/22  Yes  Miyana Mordecai, Ines Bloomer, MD  meloxicam (MOBIC) 15 MG tablet Take 1 tablet (15 mg total) by mouth daily. 05/27/22  Yes Glennon Mac, DO  metFORMIN (GLUCOPHAGE) 500 MG tablet Take 1 tablet (500 mg total) by mouth 2 (two) times daily with a meal. 05/21/22  Yes Joshva Labreck, Ines Bloomer, MD  promethazine-dextromethorphan (PROMETHAZINE-DM) 6.25-15 MG/5ML syrup Take 5 mLs by mouth 4 (four) times daily as needed for cough. 06/19/22  Yes Lynden Oxford Scales, PA-C  rosuvastatin (CRESTOR) 20 MG tablet Take 1 tablet (20 mg total) by mouth daily. 05/21/22  Yes Horald Pollen, MD    No Known Allergies  Patient Active Problem List   Diagnosis Date Noted   HLD (hyperlipidemia) 06/28/2022   HTN (hypertension) 06/28/2022   Acute asthma exacerbation 06/28/2022   Ischemic cardiomyopathy 05/21/2022   Esophageal dysphagia 05/21/2022   Chronic bilateral low back pain without sciatica 05/21/2022   Chronic left hip pain 05/21/2022   Age-related nuclear cataract of both eyes 02/05/2021   Vitreomacular adhesion of left eye 12/13/2019   Severe nonproliferative  diabetic retinopathy of right eye without macular edema associated with type 2 diabetes mellitus ([place]) 12/13/2019   Severe nonproliferative diabetic retinopathy of left eye, with macular edema, associated with type 2 diabetes mellitus ([place]) 12/13/2019   Vitreomacular adhesion of right eye 12/13/2019   History of diabetes mellitus 02/09/2018   CARDIOMYOPATHY, ISCHEMIC 05/18/2010   Coronary atherosclerosis 10/11/2009   Dyslipidemia associated with type 2 diabetes mellitus ([place]) 10/09/2009   HYPERLIPIDEMIA 10/09/2009   Hypertension associated with diabetes ([place]) 10/09/2009   MYOCARDIAL INFARCTION, HX OF 10/09/2009    Past Medical History:  Diagnosis Date   Allergy    Asthma    Coronary artery disease    s/p lateral MI 4 /11, s/p Promus DES OM2   Diabetes mellitus without complication ([place])    Heart murmur    Hyperlipidemia    Hypertension     Myocardial infarct ([place]) 2011    Past Surgical History:  Procedure Laterality Date   ADENOIDECTOMY     CORONARY ARTERY BYPASS GRAFT  2011   CORONARY STENT PLACEMENT     TONSILLECTOMY     TYMPANIC MEMBRANE REPAIR Right 2004   VASECTOMY      Social History   Socioeconomic History   Marital status: Married    Spouse name: Not on file   Number of children: 3   Years of education: Not on file   Highest education level: Not on file  Occupational History   Occupation: Charity fundraiser   Occupation: retired  Tobacco Use   Smoking status: Former    Packs/day: 0.50    Years: 5.00    Total pack years: 2.50    Types: Cigarettes    Quit date: 10/14/1974    Years since quitting: 47.7   Smokeless tobacco: Never  Vaping Use   Vaping Use: Never used  Substance and Sexual Activity   Alcohol use: Yes    Alcohol/week: 5.0 standard drinks of alcohol    Types: 5 Standard drinks or equivalent per week   Drug use: No   Sexual activity: Yes  Other Topics Concern   Not on file  Social History Narrative   Occupation- Photographer    Former -tobacco -stopped 1979   Married ,3 children     Alcohol use - yes , social    Drug use - no   Regular exercise- yes    Social Determinants of Health   Financial Resource Strain: Low Risk  (10/14/2018)   Overall Financial Resource Strain (CARDIA)    Difficulty of Paying Living Expenses: Not hard at all  Food Insecurity: No Food Insecurity (07/03/2022)   Hunger Vital Sign    Worried About Running Out of Food in the Last Year: Never true    Ran Out of Food in the Last Year: Never true  Transportation Needs: No Transportation Needs (07/03/2022)   PRAPARE - Hydrologist (Medical): No    Lack of Transportation (Non-Medical): No  Physical Activity: Sufficiently Active (10/14/2018)   Exercise Vital Sign    Days of Exercise per Week: 3 days    Minutes of Exercise per Session: 100 min  Stress: No Stress  Concern Present (10/14/2018)   [place]    Feeling of Stress : Not at all  Social Connections: Moderately Integrated (10/14/2018)   Social Connection and Isolation Panel [NHANES]    Frequency of Communication with Friends and Family: Once a week  Frequency of Social Gatherings with Friends and Family: Once a week    Attends Religious Services: More than 4 times per year    Active Member of [employer] or Organizations: Yes    Attends Archivist Meetings: 1 to 4 times per year    Marital Status: Married  Human resources officer Violence: Not At Risk (06/28/2022)   Humiliation, Afraid, Rape, and Kick questionnaire    Fear of Current or Ex-Partner: No    Emotionally Abused: No    Physically Abused: No    Sexually Abused: No    Family History  Problem Relation Age of Onset   Heart disease Mother    Hypertension Mother    Heart disease Father    Hypertension Father    Heart disease Brother    Heart disease Sister    Colon cancer Maternal Grandfather 92   Colon polyps Neg Hx    Esophageal cancer Neg Hx    Stomach cancer Neg Hx    Rectal cancer Neg Hx      Review of Systems  Constitutional: Negative.  Negative for chills and fever.  HENT: Negative.  Negative for congestion and sore throat.   Respiratory: Negative.  Negative for cough and shortness of breath.   Cardiovascular: Negative.  Negative for chest pain and palpitations.  Gastrointestinal:  Negative for abdominal pain, nausea and vomiting.  Genitourinary: Negative.  Negative for dysuria.  Skin: Negative.  Negative for rash.  Neurological: Negative.  Negative for dizziness and headaches.  All other systems reviewed and are negative.   Today's Vitals   07/10/22 0942  BP: 128/76  Pulse: (!) 55  Temp: 98.7 F (37.1 C)  TempSrc: Oral  SpO2: 96%  Weight: 194 lb (88 kg)  Height: '5\' 9"'$  (1.753 m)   Body mass index is 28.65 kg/m. Wt Readings from Last 3  Encounters:  07/10/22 194 lb (88 kg)  07/03/22 194 lb (88 kg)  07/02/22 196 lb (88.9 kg)    Physical Exam Vitals reviewed.  Constitutional:      Appearance: Normal appearance.  HENT:     Head: Normocephalic.     Mouth/Throat:     Mouth: Mucous membranes are moist.     Pharynx: Oropharynx is clear.  Eyes:     Extraocular Movements: Extraocular movements intact.     Conjunctiva/sclera: Conjunctivae normal.     Pupils: Pupils are equal, round, and reactive to light.  Cardiovascular:     Rate and Rhythm: Normal rate and regular rhythm.     Pulses: Normal pulses.     Heart sounds: Normal heart sounds.  Pulmonary:     Effort: Pulmonary effort is normal.     Breath sounds: Normal breath sounds.  Abdominal:     Palpations: Abdomen is soft.     Tenderness: There is no abdominal tenderness.  Musculoskeletal:     Cervical back: No tenderness.     Right lower leg: No edema.     Left lower leg: No edema.  Lymphadenopathy:     Cervical: No cervical adenopathy.  Skin:    General: Skin is warm and dry.  Neurological:     General: No focal deficit present.     Mental Status: He is alert and oriented to person, place, and time.  Psychiatric:        Mood and Affect: Mood normal.        Behavior: Behavior normal.     ASSESSMENT & PLAN: A total of 49 minutes was spent  with the patient and counseling/coordination of care regarding preparing for this visit, review of most recent office visit notes, review of most recent hospital discharge summary notes, review of multiple chronic medical conditions and their management, review of all medications, cardiovascular risks associated with hypertension, diabetes, dyslipidemia, education on nutrition, prognosis, documentation, and need for follow-up.  Problem List Items Addressed This Visit       Cardiovascular and Mediastinum   Hypertension associated with diabetes (Scott Dodson) - Primary    Well-controlled hypertension.  Continue lisinopril 40 mg,  amlodipine 5 mg daily and carvedilol 25 mg twice a day BP Readings from Last 3 Encounters:  07/10/22 128/76  07/03/22 (!) 140/64  07/02/22 110/80  Well-controlled diabetes with hemoglobin A1c of 6.4 Lab Results  Component Value Date   HGBA1C 6.4 (H) 06/28/2022  Continue Farxiga 10 mg daily, glipizide 5 mg daily and metformin 500 mg twice a day Cardiovascular risks associated with hypertension and diabetes discussed Diet and nutrition discussed        Ischemic cardiomyopathy    No signs of congestive heart failure.  Stable. Continue blood pressure control along with beta-blocker carvedilol 25 mg twice a day        Respiratory   Acute asthma exacerbation    Well-controlled.  No need for oxygen supplementation.  Continue Combivent twice a day.        Endocrine   Dyslipidemia associated with type 2 diabetes mellitus ([place])    Stable.  Diet and nutrition discussed. Continue rosuvastatin 20 mg daily      Other Visit Diagnoses     Hospital discharge follow-up          Patient Instructions  Health Maintenance After Age 61 After age 52, you are at a higher risk for certain long-term diseases and infections as well as injuries from falls. Falls are a major cause of broken bones and head injuries in people who are older than age 8. Getting regular preventive care can help to keep you healthy and well. Preventive care includes getting regular testing and making lifestyle changes as recommended by your health care provider. Talk with your health care provider about: Which screenings and tests you should have. A screening is a test that checks for a disease when you have no symptoms. A diet and exercise plan that is right for you. What should I know about screenings and tests to prevent falls? Screening and testing are the best ways to find a health problem early. Early diagnosis and treatment give you the best chance of managing medical conditions that are common after age 22.  Certain conditions and lifestyle choices may make you more likely to have a fall. Your health care provider may recommend: Regular vision checks. Poor vision and conditions such as cataracts can make you more likely to have a fall. If you wear glasses, make sure to get your prescription updated if your vision changes. Medicine review. Work with your health care provider to regularly review all of the medicines you are taking, including over-the-counter medicines. Ask your health care provider about any side effects that may make you more likely to have a fall. Tell your health care provider if any medicines that you take make you feel dizzy or sleepy. Strength and balance checks. Your health care provider may recommend certain tests to check your strength and balance while standing, walking, or changing positions. Foot health exam. Foot pain and numbness, as well as not wearing proper footwear, can make you more likely  to have a fall. Screenings, including: Osteoporosis screening. Osteoporosis is a condition that causes the bones to get weaker and break more easily. Blood pressure screening. Blood pressure changes and medicines to control blood pressure can make you feel dizzy. Depression screening. You may be more likely to have a fall if you have a fear of falling, feel depressed, or feel unable to do activities that you used to do. Alcohol use screening. Using too much alcohol can affect your balance and may make you more likely to have a fall. Follow these instructions at home: Lifestyle Do not drink alcohol if: Your health care provider tells you not to drink. If you drink alcohol: Limit how much you have to: 0-1 drink a day for women. 0-2 drinks a day for men. Know how much alcohol is in your drink. In the [place] one drink equals one 12 oz bottle of beer (355 mL), one 5 oz glass of wine (148 mL), or one 1 oz glass of hard liquor (44 mL). Do not use any products that contain nicotine or  tobacco. These products include cigarettes, chewing tobacco, and vaping devices, such as e-cigarettes. If you need help quitting, ask your health care provider. Activity  Follow a regular exercise program to stay fit. This will help you maintain your balance. Ask your health care provider what types of exercise are appropriate for you. If you need a cane or walker, use it as recommended by your health care provider. Wear supportive shoes that have nonskid soles. Safety  Remove any tripping hazards, such as rugs, cords, and clutter. Install safety equipment such as grab bars in bathrooms and safety rails on stairs. Keep rooms and walkways well-lit. General instructions Talk with your health care provider about your risks for falling. Tell your health care provider if: You fall. Be sure to tell your health care provider about all falls, even ones that seem minor. You feel dizzy, tiredness (fatigue), or off-balance. Take over-the-counter and prescription medicines only as told by your health care provider. These include supplements. Eat a healthy diet and maintain a healthy weight. A healthy diet includes low-fat dairy products, low-fat (lean) meats, and fiber from whole grains, beans, and lots of fruits and vegetables. Stay current with your vaccines. Schedule regular health, dental, and eye exams. Summary Having a healthy lifestyle and getting preventive care can help to protect your health and wellness after age 73. Screening and testing are the best way to find a health problem early and help you avoid having a fall. Early diagnosis and treatment give you the best chance for managing medical conditions that are more common for people who are older than age 44. Falls are a major cause of broken bones and head injuries in people who are older than age 2. Take precautions to prevent a fall at home. Work with your health care provider to learn what changes you can make to improve your health and  wellness and to prevent falls. This information is not intended to replace advice given to you by your health care provider. Make sure you discuss any questions you have with your health care provider. Document Revised: 11/06/2020 Document Reviewed: 11/06/2020 Elsevier Patient Education  [place], MD [facility]

## 2022-07-10 NOTE — Patient Instructions (Signed)
Health Maintenance After Age 70 After age 70, you are at a higher risk for certain long-term diseases and infections as well as injuries from falls. Falls are a major cause of broken bones and head injuries in people who are older than age 70. Getting regular preventive care can help to keep you healthy and well. Preventive care includes getting regular testing and making lifestyle changes as recommended by your health care provider. Talk with your health care provider about: Which screenings and tests you should have. A screening is a test that checks for a disease when you have no symptoms. A diet and exercise plan that is right for you. What should I know about screenings and tests to prevent falls? Screening and testing are the best ways to find a health problem early. Early diagnosis and treatment give you the best chance of managing medical conditions that are common after age 70. Certain conditions and lifestyle choices may make you more likely to have a fall. Your health care provider may recommend: Regular vision checks. Poor vision and conditions such as cataracts can make you more likely to have a fall. If you wear glasses, make sure to get your prescription updated if your vision changes. Medicine review. Work with your health care provider to regularly review all of the medicines you are taking, including over-the-counter medicines. Ask your health care provider about any side effects that may make you more likely to have a fall. Tell your health care provider if any medicines that you take make you feel dizzy or sleepy. Strength and balance checks. Your health care provider may recommend certain tests to check your strength and balance while standing, walking, or changing positions. Foot health exam. Foot pain and numbness, as well as not wearing proper footwear, can make you more likely to have a fall. Screenings, including: Osteoporosis screening. Osteoporosis is a condition that causes  the bones to get weaker and break more easily. Blood pressure screening. Blood pressure changes and medicines to control blood pressure can make you feel dizzy. Depression screening. You may be more likely to have a fall if you have a fear of falling, feel depressed, or feel unable to do activities that you used to do. Alcohol use screening. Using too much alcohol can affect your balance and may make you more likely to have a fall. Follow these instructions at home: Lifestyle Do not drink alcohol if: Your health care provider tells you not to drink. If you drink alcohol: Limit how much you have to: 0-1 drink a day for women. 0-2 drinks a day for men. Know how much alcohol is in your drink. In the [place] one drink equals one 12 oz bottle of beer (355 mL), one 5 oz glass of wine (148 mL), or one 1 oz glass of hard liquor (44 mL). Do not use any products that contain nicotine or tobacco. These products include cigarettes, chewing tobacco, and vaping devices, such as e-cigarettes. If you need help quitting, ask your health care provider. Activity  Follow a regular exercise program to stay fit. This will help you maintain your balance. Ask your health care provider what types of exercise are appropriate for you. If you need a cane or walker, use it as recommended by your health care provider. Wear supportive shoes that have nonskid soles. Safety  Remove any tripping hazards, such as rugs, cords, and clutter. Install safety equipment such as grab bars in bathrooms and safety rails on stairs. Keep rooms and walkways   well-lit. General instructions Talk with your health care provider about your risks for falling. Tell your health care provider if: You fall. Be sure to tell your health care provider about all falls, even ones that seem minor. You feel dizzy, tiredness (fatigue), or off-balance. Take over-the-counter and prescription medicines only as told by your health care provider. These include  supplements. Eat a healthy diet and maintain a healthy weight. A healthy diet includes low-fat dairy products, low-fat (lean) meats, and fiber from whole grains, beans, and lots of fruits and vegetables. Stay current with your vaccines. Schedule regular health, dental, and eye exams. Summary Having a healthy lifestyle and getting preventive care can help to protect your health and wellness after age 70. Screening and testing are the best way to find a health problem early and help you avoid having a fall. Early diagnosis and treatment give you the best chance for managing medical conditions that are more common for people who are older than age 70. Falls are a major cause of broken bones and head injuries in people who are older than age 70. Take precautions to prevent a fall at home. Work with your health care provider to learn what changes you can make to improve your health and wellness and to prevent falls. This information is not intended to replace advice given to you by your health care provider. Make sure you discuss any questions you have with your health care provider. Document Revised: 11/06/2020 Document Reviewed: 11/06/2020 Elsevier Patient Education  2023 [employer] Inc.  

## 2022-07-11 NOTE — Therapy (Signed)
OUTPATIENT PHYSICAL THERAPY TREATMENT NOTE   Patient Name: Scott Dodson MRN: [id number] DOB:1950/12/07, 72 y.o., male Today's Date: 07/12/2022  PCP: Horald Pollen, MD  REFERRING PROVIDER: Glennon Mac, DO   END OF SESSION:   PT End of Session - 07/12/22 0846     Visit Number 3    Number of Visits 7    Date for PT Re-Evaluation 07/19/22    Authorization Type [employer] MEDICARE    Authorization - Visit Number 23    Authorization - Number of Visits 2    Progress Note Due on Visit 10    PT Start Time 0846    PT Stop Time 0930    PT Time Calculation (min) 44 min    Activity Tolerance Patient tolerated treatment well    Behavior During Therapy [facility] for tasks assessed/performed             Past Medical History:  Diagnosis Date   Allergy    Asthma    Coronary artery disease    s/p lateral MI 4 /11, s/p Promus DES OM2   Diabetes mellitus without complication ([place])    Heart murmur    Hyperlipidemia    Hypertension    Myocardial infarct ([place]) 2011   Past Surgical History:  Procedure Laterality Date   ADENOIDECTOMY     CORONARY ARTERY BYPASS GRAFT  2011   CORONARY [place] Right 2004   VASECTOMY     Patient Active Problem List   Diagnosis Date Noted   HLD (hyperlipidemia) 06/28/2022   HTN (hypertension) 06/28/2022   Acute asthma exacerbation 06/28/2022   Ischemic cardiomyopathy 05/21/2022   Esophageal dysphagia 05/21/2022   Chronic bilateral low back pain without sciatica 05/21/2022   Chronic left hip pain 05/21/2022   Age-related nuclear cataract of both eyes 02/05/2021   Vitreomacular adhesion of left eye 12/13/2019   Severe nonproliferative diabetic retinopathy of right eye without macular edema associated with type 2 diabetes mellitus ([place]) 12/13/2019   Severe nonproliferative diabetic retinopathy of left eye, with macular edema, associated with type 2 diabetes mellitus ([place]) 12/13/2019    Vitreomacular adhesion of right eye 12/13/2019   History of diabetes mellitus 02/09/2018   CARDIOMYOPATHY, ISCHEMIC 05/18/2010   Coronary atherosclerosis 10/11/2009   Dyslipidemia associated with type 2 diabetes mellitus ([place]) 10/09/2009   HYPERLIPIDEMIA 10/09/2009   Hypertension associated with diabetes ([place]) 10/09/2009   MYOCARDIAL INFARCTION, HX OF 10/09/2009    REFERRING DIAG: M54.50,G89.29 (ICD-10-CM) - Chronic bilateral low back pain without sciatica   THERAPY DIAG:  Other low back pain  Muscle weakness (generalized)  Rationale for Evaluation and Treatment Rehabilitation  SUBJECTIVE:  SUBJECTIVE STATEMENT: Pt reports he is feeling better re: his cold and he reports he is not experiencing back pain. Pt notes he has been able to complete some of his HEP.   PAIN:  Are you having pain? Yes: NPRS scale: 1/10 Pain location: Low back Pain description: Throb, intermittent half the time Aggravating factors: Bending Relieving factors: Meloxicam, heat  PERTINENT HISTORY:  High BMI; CAD   PRECAUTIONS: None   WEIGHT BEARING RESTRICTIONS: No   FALLS:  Has patient fallen in last 6 months? No   LIVING ENVIRONMENT: Lives with: lives with their family Lives in: House/apartment   OCCUPATION: Driver for a [employer]   PLOF: Independent   PATIENT GOALS: Exercises that can be helpful    OBJECTIVE: (objective measures completed at initial evaluation unless otherwise dated)    DIAGNOSTIC FINDINGS:  Xray 05/27/22 FINDINGS: There is no evidence of hip fracture or dislocation. There is no evidence of arthropathy or other focal bone abnormality.   IMPRESSION: Negative.   FINDINGS: There is no evidence of hip fracture or dislocation. There is no evidence of arthropathy or other focal bone  abnormality.   IMPRESSION: Negative.   PATIENT SURVEYS:  FOTO: Perceived low back function 50%, predicted 70%; L hip  function 57%, predicted 73%    SCREENING FOR RED FLAGS: Bowel or bladder incontinence: No Spinal tumors: No Cauda equina syndrome: No Compression fracture: No   COGNITION: Overall cognitive status: Within functional limits for tasks assessed                          SENSATION: WFL   MUSCLE LENGTH: Hamstrings: Right tight; Left tight Thomas test: Right NT; Left NT   POSTURE: No Significant postural limitations   PALPATION: Not TTP   LUMBAR ROM:    AROM eval  Flexion Mod limited, decreased reversal of lordosis, tight hamstrings  Extension Full  Right lateral flexion Min limited, pulling L low back  Left lateral flexion Min limited, puliing R low back  Right rotation Full  Left rotation Full   (Blank rows = not tested)   LOWER EXTREMITY ROM:      Passive  Right eval Left eval  Hip flexion      Hip extension      Hip abduction      Hip adduction      Hip internal rotation decreased decreased  Hip external rotation      Knee flexion      Knee extension      Ankle dorsiflexion      Ankle plantarflexion      Ankle inversion      Ankle eversion       (Blank rows = not tested)   LOWER EXTREMITY MMT:                         Myotome screen negative, bilat strength bilat. Weak abdominals MMT Right eval Left eval  Hip flexion      Hip extension      Hip abduction      Hip adduction      Hip internal rotation      Hip external rotation      Knee flexion      Knee extension      Ankle dorsiflexion      Ankle plantarflexion      Ankle inversion      Ankle eversion       (  Blank rows = not tested)   LUMBAR SPECIAL TESTS:  Straight leg raise test: Negative, Slump test: Negative, and bridge tolerance WNLs at 60"   FUNCTIONAL TESTS:  NT   GAIT: Distance walked: 200' Assistive device utilized: None Level of assistance: Complete  Independence Comments: Out toeing, decreased pelvic rotation   TODAY'S TREATMENT:    [facility] Adult PT Treatment:                                                DATE: 07/12/22 Therapeutic Exercise: Nustep L5 5 min UE/LE Supine Bridge c PPT 5 reps - 5 hold Supine Posterior Pelvic Tilt  5 reps - 3 hold Hooklying Hamstring Stretch 2 reps - 20 hold each Supine Piriformis Stretch 1 reps - 20 hold Supine clams 2x10 3" BluTB Supine hip add sets c ball x15 3" Supine Lower Trunk Rotation  5 reps - 5 hold Reviewed HEP and pt completed properly                                                                                                                           [facility] Adult PT Treatment:                                                DATE: 06/28/22 Therapeutic Exercise: Supine Bridge 5 reps - 5 hold Supine Posterior Pelvic Tilt  5 reps - 3 hold Hooklying Hamstring Stretch 1 reps - 20 hold Supine Piriformis Stretch 1 reps - 20 hold Supine Lower Trunk Rotation  5 reps - 5 hold Reviewed HEP and pt completed properly  [facility] Adult PT Treatment:                                                DATE: 06/07/22 Therapeutic Exercise: Developed, instructed in, and pt completed therex as noted in HEP    PATIENT EDUCATION:  Education details: Eval findings, POC, HEP  Person educated: Patient Education method: Explanation, Demonstration, Tactile cues, Verbal cues, and Handouts Education comprehension: verbalized understanding, returned demonstration, verbal cues required, and tactile cues required   HOME EXERCISE PROGRAM: Access Code: [place] URL: [email or web address] Date: 07/12/2022 Prepared by: Gar Ponto  Exercises - Supine Bridge  - 1 x daily - 7 x weekly - 1-3 sets - 10 reps - 5 hold - Supine Posterior Pelvic Tilt  - 1 x daily - 7 x weekly - 1-3 sets - 10 reps - 3 hold - Hooklying Clamshell with Resistance  - 1 x daily - 7 x weekly - 1-3 sets - 10 reps -  3 hold - Supine Hip  Adduction Isometric with Ball  - 1 x daily - 7 x weekly - 1-2 sets - 15 reps - 3 hold - Hooklying Hamstring Stretch with Strap  - 1 x daily - 7 x weekly - 1 sets - 3 reps - 20 hold - Supine Piriformis Stretch with Foot on Ground  - 1 x daily - 7 x weekly - 1 sets - 3 reps - 20 hold - Supine Lower Trunk Rotation  - 1 x daily - 7 x weekly - 1 sets - 3 reps - 20 holdld   ASSESSMENT:   CLINICAL IMPRESSION: PT was completed for lumbopelvic strengthening and flexibility. Therex were added to pt's previous program. Pt returned proper demonstration. Pt tolerated PT today without adverse effects. Pt will continue to benefit from skilled PT to address impairments for improved function    OBJECTIVE IMPAIRMENTS: decreased activity tolerance, decreased strength, pain, and high BMI .    ACTIVITY LIMITATIONS: carrying, lifting, bending, and sitting   PARTICIPATION LIMITATIONS: cleaning, laundry, driving, occupation, and yard work   PERSONAL FACTORS: Age, Fitness, Past/current experiences, Time since onset of injury/illness/exacerbation, and 1 comorbidity: High BMI  are also affecting patient's functional outcome.    REHAB POTENTIAL: Good   CLINICAL DECISION MAKING: Stable/uncomplicated   EVALUATION COMPLEXITY: Low     GOALS:   SHORT TERM GOALS=LTGs     LONG TERM GOALS: Target date: 07/19/22   Pt will be Ind in a final HEP to maintain achieved LOF Baseline: initiated Goal status: Ongoing   2.  Pt will voice understanding of measures to assist in pain reduction  Baseline:  Goal status: INITIAL   3.  Improve trunk flexion to min limited for improved lumbar mobility and function Baseline: Mod limited Goal status: INITIAL   4.  Pt will demonstrate improved core strength by completing a plank from his knees for 30" Baseline:  Goal status: INITIAL   5.  Pt's FOTO score will improved to the predicted value of 73% for L hip and 70% for low back as indication of improved function  Baseline:  57% L hip and 50% low back Goal status: INITIAL   PLAN:   PT FREQUENCY: 1x/week   PT DURATION: 6 weeks   PLANNED INTERVENTIONS: Therapeutic exercises, Therapeutic activity, Balance training, Patient/Family education, Self Care, Joint mobilization, Aquatic Therapy, Dry Needling, Electrical stimulation, Spinal manipulation, Spinal mobilization, Cryotherapy, Moist heat, Taping, Traction, Ultrasound, Ionotophoresis '4mg'$ /ml Dexamethasone, Manual therapy, and Re-evaluation.   PLAN FOR NEXT SESSION: Review FOTO; assess response to HEP; progress therex as indicated; use of modalities, manual therapy; and TPDN as indicated.  Anneth Brunell MS, PT 07/12/22 9:34 AM

## 2022-07-12 ENCOUNTER — Ambulatory Visit: Payer: Medicare Other | Attending: Sports Medicine

## 2022-07-12 DIAGNOSIS — M5459 Other low back pain: Secondary | ICD-10-CM | POA: Insufficient documentation

## 2022-07-12 DIAGNOSIS — M6281 Muscle weakness (generalized): Secondary | ICD-10-CM | POA: Insufficient documentation

## 2022-07-18 ENCOUNTER — Telehealth: Payer: Self-pay | Admitting: Emergency Medicine

## 2022-07-18 NOTE — Telephone Encounter (Signed)
Patient saw Dr. Mitchel Honour last week - they discussed his not needing his oxygen equipment any longer.  Patient called [employer] and was told that he can not cancel the oxygen that Dr. Mitchel Honour would have to.  Please call [facility] - [phone]  Fax # [phone]  Please advise patient:  [phone]

## 2022-07-18 NOTE — Therapy (Signed)
OUTPATIENT PHYSICAL THERAPY TREATMENT NOTE/Discharge   Patient Name: Scott Dodson MRN: [id number] DOB:03-31-1951, 72 y.o., male Today's Date: 07/19/2022  PCP: Horald Pollen, MD  REFERRING PROVIDER: Glennon Mac, DO   END OF SESSION:   PT End of Session - 07/19/22 0857     Visit Number 4    Number of Visits 7    Date for PT Re-Evaluation 07/19/22    Authorization Type [employer] MEDICARE    Authorization - Visit Number 6    Authorization - Number of Visits 3    PT Start Time 8676    PT Stop Time 0930    PT Time Calculation (min) 40 min    Activity Tolerance Patient tolerated treatment well    Behavior During Therapy [facility] for tasks assessed/performed              Past Medical History:  Diagnosis Date   Allergy    Asthma    Coronary artery disease    s/p lateral MI 4 /11, s/p Promus DES OM2   Diabetes mellitus without complication ([place])    Heart murmur    Hyperlipidemia    Hypertension    Myocardial infarct ([place]) 2011   Past Surgical History:  Procedure Laterality Date   ADENOIDECTOMY     CORONARY ARTERY BYPASS GRAFT  2011   CORONARY STENT PLACEMENT     TONSILLECTOMY     TYMPANIC MEMBRANE REPAIR Right 2004   VASECTOMY     Patient Active Problem List   Diagnosis Date Noted   HLD (hyperlipidemia) 06/28/2022   HTN (hypertension) 06/28/2022   Acute asthma exacerbation 06/28/2022   Ischemic cardiomyopathy 05/21/2022   Esophageal dysphagia 05/21/2022   Chronic bilateral low back pain without sciatica 05/21/2022   Chronic left hip pain 05/21/2022   Age-related nuclear cataract of both eyes 02/05/2021   Vitreomacular adhesion of left eye 12/13/2019   Severe nonproliferative diabetic retinopathy of right eye without macular edema associated with type 2 diabetes mellitus ([place]) 12/13/2019   Severe nonproliferative diabetic retinopathy of left eye, with macular edema, associated with type 2 diabetes mellitus ([place]) 12/13/2019   Vitreomacular adhesion of right  eye 12/13/2019   History of diabetes mellitus 02/09/2018   CARDIOMYOPATHY, ISCHEMIC 05/18/2010   Coronary atherosclerosis 10/11/2009   Dyslipidemia associated with type 2 diabetes mellitus ([place]) 10/09/2009   HYPERLIPIDEMIA 10/09/2009   Hypertension associated with diabetes ([place]) 10/09/2009   MYOCARDIAL INFARCTION, HX OF 10/09/2009    REFERRING DIAG: M54.50,G89.29 (ICD-10-CM) - Chronic bilateral low back pain without sciatica   THERAPY DIAG:  Other low back pain  Muscle weakness (generalized)  Rationale for Evaluation and Treatment Rehabilitation  SUBJECTIVE:  SUBJECTIVE STATEMENT: Pt reports he is doing well and is not experiencing low back or hip pain.   PAIN:  Are you having pain? Yes: NPRS scale: 0/10 Pain location: Low back Pain description: Throb, intermittent half the time Aggravating factors: Bending Relieving factors: Meloxicam, heat  PERTINENT HISTORY:  High BMI; CAD   PRECAUTIONS: None   WEIGHT BEARING RESTRICTIONS: No   FALLS:  Has patient fallen in last 6 months? No   LIVING ENVIRONMENT: Lives with: lives with their family Lives in: House/apartment   OCCUPATION: Driver for a [employer]   PLOF: Independent   PATIENT GOALS: Exercises that can be helpful    OBJECTIVE: (objective measures completed at initial evaluation unless otherwise dated)    DIAGNOSTIC FINDINGS:  Xray 05/27/22 FINDINGS: There is no evidence of hip fracture or dislocation. There is no evidence of arthropathy or other focal bone abnormality.   IMPRESSION: Negative.   FINDINGS: There is no evidence of hip fracture or dislocation. There is no evidence of arthropathy or other focal bone abnormality.   IMPRESSION: Negative.   PATIENT SURVEYS:  FOTO: Perceived low back function 50%,  predicted 70%; L hip  function 57%, predicted 73%   07/19/22= L hip 99%, Lumbar 75% SCREENING FOR RED FLAGS: Bowel or bladder incontinence: No Spinal tumors: No Cauda equina syndrome: No Compression fracture: No   COGNITION: Overall cognitive status: Within functional limits for tasks assessed                          SENSATION: WFL   MUSCLE LENGTH: Hamstrings: Right tight; Left tight Thomas test: Right NT; Left NT   POSTURE: No Significant postural limitations   PALPATION: Not TTP   LUMBAR ROM:    AROM eval 07/19/22  Flexion Mod limited, decreased reversal of lordosis, tight hamstrings Min limited  Extension Full Full  Right lateral flexion Min limited, pulling L low back Min limited  Left lateral flexion Min limited, puliing R low back Min limited  Right rotation Full Full  Left rotation Full Full   (Blank rows = not tested)   LOWER EXTREMITY ROM:      Passive  Right eval Left eval  Hip flexion      Hip extension      Hip abduction      Hip adduction      Hip internal rotation decreased decreased  Hip external rotation      Knee flexion      Knee extension      Ankle dorsiflexion      Ankle plantarflexion      Ankle inversion      Ankle eversion       (Blank rows = not tested)   LOWER EXTREMITY MMT:                         Myotome screen negative, bilat strength bilat. Weak abdominals MMT Right eval Left eval  Hip flexion      Hip extension      Hip abduction      Hip adduction      Hip internal rotation      Hip external rotation      Knee flexion      Knee extension      Ankle dorsiflexion      Ankle plantarflexion      Ankle inversion      Ankle eversion       (  Blank rows = not tested)   LUMBAR SPECIAL TESTS:  Straight leg raise test: Negative, Slump test: Negative, and bridge tolerance WNLs at 60"   FUNCTIONAL TESTS:  NT   GAIT: Distance walked: 200' Assistive device utilized: None Level of assistance: Complete  Independence Comments: Out toeing, decreased pelvic rotation   TODAY'S TREATMENT:  [facility] Adult PT Treatment:                                                DATE: 07/19/22 Therapeutic Exercise: Nustep L5 5 min UE/LE Supine Bridge c PPT 10 reps - 5 hold Supine Posterior Pelvic Tilt  10 reps - 3 hold Abdominal bracing 90/90 x5 10" Hooklying Hamstring Stretch 2 reps - 20 hold each Supine Piriformis Stretch 1 reps - 20 hold Supine clams x10 5" BluTB Supine hip add sets c ball x15 5" Supine Lower Trunk Rotation  5 reps - 5 hold Trunk mobility Reviewed HEP and pt completed properly Therapeutic Activity: FOTO      [facility] Adult PT Treatment:                                                DATE: 07/12/22 Therapeutic Exercise: Nustep L5 5 min UE/LE Supine Bridge c PPT 5 reps - 5 hold Supine Posterior Pelvic Tilt  5 reps - 3 hold Hooklying Hamstring Stretch 2 reps - 20 hold each Supine Piriformis Stretch 1 reps - 20 hold Supine clams 2x10 3" BluTB Supine hip add sets c ball x15 3" Supine Lower Trunk Rotation  5 reps - 5 hold Reviewed HEP and pt completed properly                                                                                                                           [facility] Adult PT Treatment:                                                DATE: 06/28/22 Therapeutic Exercise: Supine Bridge 5 reps - 5 hold Supine Posterior Pelvic Tilt  5 reps - 3 hold Hooklying Hamstring Stretch 1 reps - 20 hold Supine Piriformis Stretch 1 reps - 20 hold Supine Lower Trunk Rotation  5 reps - 5 hold Reviewed HEP and pt completed properly  [facility] Adult PT Treatment:  DATE: 06/07/22 Therapeutic Exercise: Developed, instructed in, and pt completed therex as noted in HEP    PATIENT EDUCATION:  Education details: Eval findings, POC, HEP  Person educated: Patient Education method: Explanation, Demonstration, Tactile cues, Verbal cues, and  Handouts Education comprehension: verbalized understanding, returned demonstration, verbal cues required, and tactile cues required   HOME EXERCISE PROGRAM: Access Code: [email or web address] URL: [email or web address] Date: 07/19/2022 Prepared by: Gar Ponto  Exercises - Supine Bridge  - 1 x daily - 7 x weekly - 1-3 sets - 10 reps - 5 hold - Supine Posterior Pelvic Tilt  - 1 x daily - 7 x weekly - 1-3 sets - 10 reps - 3 hold - Supine 90/90 Abdominal Bracing  - 1 x daily - 7 x weekly - 3 sets - 10 reps - Hooklying Clamshell with Resistance  - 1 x daily - 7 x weekly - 1-3 sets - 10 reps - 3 hold - Supine Hip Adduction Isometric with Ball  - 1 x daily - 7 x weekly - 1-2 sets - 15 reps - 3 hold - Hooklying Hamstring Stretch with Strap  - 1 x daily - 7 x weekly - 1 sets - 3 reps - 20 hold - Supine Piriformis Stretch with Foot on Ground  - 1 x daily - 7 x weekly - 1 sets - 3 reps - 20 hold - Supine Lower Trunk Rotation  - 1 x daily - 7 x weekly - 1 sets - 3 reps - 20 hold   ASSESSMENT:   CLINICAL IMPRESSION: Pt has respoded well to medical and PT intervention re: his low back and L hip. Pt has improved with pain, ROM, strength and function meeting all set goals. Pt is ind with a HEP to maintain his current LOF.  OBJECTIVE IMPAIRMENTS: decreased activity tolerance, decreased strength, pain, and high BMI .    ACTIVITY LIMITATIONS: carrying, lifting, bending, and sitting   PARTICIPATION LIMITATIONS: cleaning, laundry, driving, occupation, and yard work   PERSONAL FACTORS: Age, Fitness, Past/current experiences, Time since onset of injury/illness/exacerbation, and 1 comorbidity: High BMI  are also affecting patient's functional outcome.    REHAB POTENTIAL: Good   CLINICAL DECISION MAKING: Stable/uncomplicated   EVALUATION COMPLEXITY: Low     GOALS:   SHORT TERM GOALS=LTGs     LONG TERM GOALS: Target date: 07/19/22   Pt will be Ind in a final HEP to maintain achieved  LOF Baseline: initiated Goal status: MET   2.  Pt will voice understanding of measures to assist in pain reduction  Baseline: HEP Goal status: MET   3.  Improve trunk flexion to min limited for improved lumbar mobility and function Baseline: Mod limited Status: 07/19/22= See flow sheets Goal status: MET   4.  Pt will demonstrate improved core strength by completing a plank from his knees for 30" Baseline:  Status: 07/19/22= 60" Goal status: MET   5.  Pt's FOTO score will improved to the predicted value of 73% for L hip and 70% for low back as indication of improved function  Baseline: 57% L hip and 50% low back Status: 99% L hip and 75% low back Goal status: MET   PLAN:   PT FREQUENCY: 1x/week   PT DURATION: 6 weeks   PLANNED INTERVENTIONS: Therapeutic exercises, Therapeutic activity, Balance training, Patient/Family education, Self Care, Joint mobilization, Aquatic Therapy, Dry Needling, Electrical stimulation, Spinal manipulation, Spinal mobilization, Cryotherapy, Moist heat, Taping, Traction, Ultrasound, Ionotophoresis '4mg'$ /ml Dexamethasone, Manual therapy, and Re-evaluation.   PLAN  FOR NEXT SESSION:   PHYSICAL THERAPY DISCHARGE SUMMARY  Visits from Start of Care: 4  Current functional level related to goals / functional outcomes: See clinical impression and PT goals    Remaining deficits: See clinical impression and PT goals    Education / Equipment: HEP   Patient agrees to discharge. Patient goals were met. Patient is being discharged due to being pleased with the current functional level.   Laniqua Torrens MS, PT 07/19/22 2:42 PM

## 2022-07-19 ENCOUNTER — Ambulatory Visit: Payer: Medicare Other

## 2022-07-19 DIAGNOSIS — M6281 Muscle weakness (generalized): Secondary | ICD-10-CM | POA: Diagnosis not present

## 2022-07-19 DIAGNOSIS — M5459 Other low back pain: Secondary | ICD-10-CM | POA: Diagnosis not present

## 2022-07-19 NOTE — Telephone Encounter (Signed)
MD is out of office today will hold for his return w/response.Marland KitchenJohny Chess

## 2022-07-20 NOTE — Telephone Encounter (Signed)
Okay to cancel oxygen order.  Thanks.

## 2022-07-22 NOTE — Telephone Encounter (Signed)
Called and left message on secure VM to ok verbal for oxygen discontinued

## 2022-07-31 DIAGNOSIS — J9601 Acute respiratory failure with hypoxia: Secondary | ICD-10-CM | POA: Diagnosis not present

## 2022-07-31 DIAGNOSIS — J45901 Unspecified asthma with (acute) exacerbation: Secondary | ICD-10-CM | POA: Diagnosis not present

## 2022-08-29 DIAGNOSIS — J9601 Acute respiratory failure with hypoxia: Secondary | ICD-10-CM | POA: Diagnosis not present

## 2022-08-29 DIAGNOSIS — J45901 Unspecified asthma with (acute) exacerbation: Secondary | ICD-10-CM | POA: Diagnosis not present

## 2022-08-30 ENCOUNTER — Encounter: Payer: Self-pay | Admitting: Gastroenterology

## 2022-08-30 ENCOUNTER — Telehealth: Payer: Self-pay

## 2022-08-30 ENCOUNTER — Ambulatory Visit (AMBULATORY_SURGERY_CENTER): Payer: Medicare Other | Admitting: Gastroenterology

## 2022-08-30 VITALS — BP 126/77 | HR 52 | Temp 98.9°F | Resp 16 | Ht 69.0 in | Wt 194.0 lb

## 2022-08-30 DIAGNOSIS — E785 Hyperlipidemia, unspecified: Secondary | ICD-10-CM | POA: Diagnosis not present

## 2022-08-30 DIAGNOSIS — I10 Essential (primary) hypertension: Secondary | ICD-10-CM | POA: Diagnosis not present

## 2022-08-30 DIAGNOSIS — R1319 Other dysphagia: Secondary | ICD-10-CM

## 2022-08-30 DIAGNOSIS — R221 Localized swelling, mass and lump, neck: Secondary | ICD-10-CM

## 2022-08-30 DIAGNOSIS — I252 Old myocardial infarction: Secondary | ICD-10-CM | POA: Diagnosis not present

## 2022-08-30 MED ORDER — SODIUM CHLORIDE 0.9 % IV SOLN: 500.0000 mL | Freq: Once | INTRAVENOUS | Status: DC

## 2022-08-30 NOTE — Progress Notes (Signed)
Vss nad trans to pacu °

## 2022-08-30 NOTE — Patient Instructions (Signed)
YOU HAD AN ENDOSCOPIC PROCEDURE TODAY AT THE [facility]:   Refer to the procedure report that was given to you for any specific questions about what was found during the examination.  If the procedure report does not answer your questions, please call your gastroenterologist to clarify.  If you requested that your care partner not be given the details of your procedure findings, then the procedure report has been included in a sealed envelope for you to review at your convenience later.  YOU SHOULD EXPECT: Some feelings of bloating in the abdomen. Passage of more gas than usual.  Walking can help get rid of the air that was put into your GI tract during the procedure and reduce the bloating. If you had a lower endoscopy (such as a colonoscopy or flexible sigmoidoscopy) you may notice spotting of blood in your stool or on the toilet paper. If you underwent a bowel prep for your procedure, you may not have a normal bowel movement for a few days.  Please Note:  You might notice some irritation and congestion in your nose or some drainage.  This is from the oxygen used during your procedure.  There is no need for concern and it should clear up in a day or so.  SYMPTOMS TO REPORT IMMEDIATELY:   Following upper endoscopy (EGD)  Vomiting of blood or coffee ground material  New chest pain or pain under the shoulder blades  Painful or persistently difficult swallowing  New shortness of breath  Fever of 100F or higher  Black, tarry-looking stools  For urgent or emergent issues, a gastroenterologist can be reached at any hour by calling [phone]. Do not use MyChart messaging for urgent concerns.    DIET:  We do recommend a small meal at first, but then you may proceed to your regular diet.  Drink plenty of fluids but you should avoid alcoholic beverages for 24 hours.  ACTIVITY:  You should plan to take it easy for the rest of today and you should NOT DRIVE or use heavy machinery  until tomorrow (because of the sedation medicines used during the test).    FOLLOW UP: Our staff will call the number listed on your records the next business day following your procedure.  We will call around 7:15- 8:00 am to check on you and address any questions or concerns that you may have regarding the information given to you following your procedure. If we do not reach you, we will leave a message.     If any biopsies were taken you will be contacted by phone or by letter within the next 1-3 weeks.  Please call us at [phone] if you have not heard about the biopsies in 3 weeks.    SIGNATURES/CONFIDENTIALITY: You and/or your care partner have signed paperwork which will be entered into your electronic medical record.  These signatures attest to the fact that that the information above on your After Visit Summary has been reviewed and is understood.  Full responsibility of the confidentiality of this discharge information lies with you and/or your care-partner. 

## 2022-08-30 NOTE — Telephone Encounter (Addendum)
-   Barium esophagram to evaluate esophageal dysmotility - Please arrange a CT scan of the neck with IV contrast and a referral to [facility] for palpable left sided neck nodule. thanks   Barium esophagram and CT order in epic. Secure staff message sent to radiology scheduling to contact patient to set up CT appt.  Referral, records, pt's demographic and insurance information have been faxed to [facility] ([place]: [phone], F: [phone]).

## 2022-08-30 NOTE — Progress Notes (Signed)
History and Physical:  This patient presents for endoscopic testing for: Encounter Diagnosis  Name Primary?   Esophageal dysphagia Yes   Clinical details of this patient's symptoms are in my July 03, 2022 office consult note. About 2 years of chronic solid food dysphagia. Scott Dodson reports having noticed a lump in the left side of his neck for the last few weeks.    Patient is otherwise without complaints or active issues today.   Past Medical History: Past Medical History:  Diagnosis Date   Allergy    Asthma    Coronary artery disease    s/p lateral MI 4 /11, s/p Promus DES OM2   Diabetes mellitus without complication ([place])    Heart murmur    Hyperlipidemia    Hypertension    Myocardial infarct ([place]) 2011     Past Surgical History: Past Surgical History:  Procedure Laterality Date   ADENOIDECTOMY     CORONARY ARTERY BYPASS GRAFT  2011   CORONARY STENT PLACEMENT     TONSILLECTOMY     TYMPANIC MEMBRANE REPAIR Right 2004   UPPER GASTROINTESTINAL ENDOSCOPY     VASECTOMY      Allergies: No Known Allergies  Outpatient Meds: Current Outpatient Medications  Medication Sig Dispense Refill   amLODipine (NORVASC) 5 MG tablet TAKE 1 TABLET BY MOUTH DAILY 30 tablet 11   Ascorbic Acid (VITAMIN C) 1000 MG tablet Take 2,000 mg by mouth daily.     aspirin 81 MG tablet Take 81 mg by mouth daily.     carvedilol (COREG) 25 MG tablet Take 1 tablet (25 mg total) by mouth 2 (two) times daily with a meal. TAKE 1 TABLET BY MOUTH  TWICE DAILY WITH MEALS (Patient taking differently: Take 25 mg by mouth 2 (two) times daily with a meal.) 180 tablet 3   dapagliflozin propanediol (FARXIGA) 10 MG TABS tablet Take 1 tablet (10 mg total) by mouth daily before breakfast. 90 tablet 3   glipiZIDE (GLUCOTROL) 5 MG tablet Take 1 tablet (5 mg total) by mouth daily with breakfast. 90 tablet 3   lisinopril (ZESTRIL) 40 MG tablet Take 1 tablet (40 mg total) by mouth daily. 90 tablet 3   metFORMIN (GLUCOPHAGE)  500 MG tablet Take 1 tablet (500 mg total) by mouth 2 (two) times daily with a meal. 180 tablet 3   rosuvastatin (CRESTOR) 20 MG tablet Take 1 tablet (20 mg total) by mouth daily. 90 tablet 3   albuterol (VENTOLIN HFA) 108 (90 Base) MCG/ACT inhaler Inhale 2 puffs into the lungs every 6 (six) hours as needed for wheezing or shortness of breath. 8 g 2   cetirizine (ZYRTEC ALLERGY) 10 MG tablet Take 1 tablet (10 mg total) by mouth at bedtime. 90 tablet 1   Coenzyme Q10 (CO Q 10 PO) Take 1 tablet by mouth daily.     fluticasone (FLONASE) 50 MCG/ACT nasal spray Place 1 spray into both nostrils daily. Begin by using 2 sprays in each nare daily for 3 to 5 days, then decrease to 1 spray in each nare daily. (Patient taking differently: Place 1 spray into both nostrils daily.) 15.8 mL 2   Ipratropium-Albuterol (COMBIVENT RESPIMAT) 20-100 MCG/ACT AERS respimat Inhale 1 puff into the lungs every 6 (six) hours. 1 each 0   Current Facility-Administered Medications  Medication Dose Route Frequency Provider Last Rate Last Admin   0.9 %  sodium chloride infusion  500 mL Intravenous Once Danis, Kirke Corin, MD  ___________________________________________________________________ Objective   Exam:  BP (!) 147/91   Pulse (!) 52   Temp 98.9 F (37.2 C) (Temporal)   Ht '5\' 9"'$  (1.753 m)   Wt 194 lb (88 kg)   SpO2 96%   BMI 28.65 kg/m   CV: regular , S1/S2 Resp: clear to auscultation bilaterally, normal RR and effort noted GI: soft, no tenderness, with active bowel sounds. Approximately 2 cm firm area left of the trachea, nontender, overlying skin normal.  Assessment: Encounter Diagnosis  Name Primary?   Esophageal dysphagia Yes     Plan:  EGD  Will communicate with primary care afterward about ENT consult for neck finding.  The patient is appropriate for an endoscopic procedure in the ambulatory setting.   - Wilfrid Lund, MD

## 2022-08-30 NOTE — Progress Notes (Signed)
Called to room to assist during endoscopic procedure.  Patient ID and intended procedure confirmed with present staff. Received instructions for my participation in the procedure from the performing physician.  

## 2022-08-30 NOTE — Op Note (Addendum)
[place] Patient Name: Scott Dodson Procedure Date: 08/30/2022 7:25 AM MRN: [id number] Endoscopist: Mallie Mussel L. Loletha Carrow , MD, [id number] Age: 72 Referring MD:  Date of Birth: [birth year] Gender: Male Account #: [id number] Procedure:                Upper GI endoscopy Indications:              Esophageal dysphagia Medicines:                Monitored Anesthesia Care Procedure:                Pre-Anesthesia Assessment:                           - Prior to the procedure, a History and Physical                            was performed, and patient medications and                            allergies were reviewed. The patient's tolerance of                            previous anesthesia was also reviewed. The risks                            and benefits of the procedure and the sedation                            options and risks were discussed with the patient.                            All questions were answered, and informed consent                            was obtained. Prior Anticoagulants: The patient has                            taken no anticoagulant or antiplatelet agents. ASA                            Grade Assessment: III - A patient with severe                            systemic disease. After reviewing the risks and                            benefits, the patient was deemed in satisfactory                            condition to undergo the procedure.                           After obtaining informed consent, the endoscope was  passed under direct vision. Throughout the                            procedure, the patient's blood pressure, pulse, and                            oxygen saturations were monitored continuously. The                            Olympus Scope [id number] was introduced through the                            mouth, and advanced to the second part of duodenum.                            The upper GI endoscopy was  accomplished without                            difficulty. The patient tolerated the procedure                            well. Multiple photos were taken throughout the                            upper digestive tract, however none of the                            photographs were captured due to a technical                            problem. Scope In: Scope Out: Findings:                 The examined esophagus was normal. The scope was                            withdrawn. Dilation was performed with a Maloney                            dilator with no resistance at 52 Fr.                           The stomach was normal.                           The cardia and gastric fundus were normal on                            retroflexion. (Hill grade 1)                           The examined duodenum was normal.                           The larynx could not be well-visualized. Complications:  No immediate complications. Estimated Blood Loss:     Estimated blood loss: none. Impression:               - Normal esophagus. Dilated.                           - Normal stomach.                           - Normal examined duodenum.                           - No specimens collected. Recommendation:           - Patient has a contact number available for                            emergencies. The signs and symptoms of potential                            delayed complications were discussed with the                            patient. Return to normal activities tomorrow.                            Written discharge instructions were provided to the                            patient.                           - Resume previous diet.                           - Continue present medications.                           - Barium esophagram to assess for evidence of                            esophageal dysmotility.                           CT scan of neck and ENT consult for a soft tissue                             abnormality on the left side of the neck that the                            patient recently discovered. Yaphet Smethurst L. Loletha Carrow, MD 08/30/2022 8:19:01 AM This report has been signed electronically.

## 2022-09-02 ENCOUNTER — Telehealth: Payer: Self-pay

## 2022-09-02 DIAGNOSIS — H2513 Age-related nuclear cataract, bilateral: Secondary | ICD-10-CM | POA: Diagnosis not present

## 2022-09-02 DIAGNOSIS — E113391 Type 2 diabetes mellitus with moderate nonproliferative diabetic retinopathy without macular edema, right eye: Secondary | ICD-10-CM | POA: Diagnosis not present

## 2022-09-02 DIAGNOSIS — E113312 Type 2 diabetes mellitus with moderate nonproliferative diabetic retinopathy with macular edema, left eye: Secondary | ICD-10-CM | POA: Diagnosis not present

## 2022-09-02 LAB — HM DIABETES EYE EXAM

## 2022-09-02 NOTE — Telephone Encounter (Signed)
  Follow up Call-     08/30/2022    7:11 AM  Call back number  Post procedure Call Back phone  # [phone]  Permission to leave phone message Yes     Patient questions:  Do you have a fever, pain , or abdominal swelling? No. Pain Score  0 *  Have you tolerated food without any problems? Yes.    Have you been able to return to your normal activities? Yes.    Do you have any questions about your discharge instructions: Diet   No. Medications  No. Follow up visit  No.  Do you have questions or concerns about your Care? No.  Actions: * If pain score is 4 or above: No action needed, pain <4.

## 2022-09-13 NOTE — Telephone Encounter (Signed)
CT neck - 09/25/22 at 8 am Barium esophagram - 09/30/22 at 9 am

## 2022-09-25 ENCOUNTER — Ambulatory Visit (HOSPITAL_COMMUNITY)
Admission: RE | Admit: 2022-09-25 | Discharge: 2022-09-25 | Disposition: A | Discharge status: Home / Self Care | Payer: Medicare Other | Source: Ambulatory Visit | Attending: Gastroenterology | Admitting: Gastroenterology

## 2022-09-25 DIAGNOSIS — E041 Nontoxic single thyroid nodule: Secondary | ICD-10-CM | POA: Diagnosis not present

## 2022-09-25 DIAGNOSIS — R221 Localized swelling, mass and lump, neck: Secondary | ICD-10-CM | POA: Diagnosis not present

## 2022-09-25 LAB — POCT I-STAT CREATININE: Creatinine, Ser: 1.1 mg/dL (ref 0.61–1.24)

## 2022-09-25 MED ORDER — IOHEXOL 350 MG/ML SOLN
75.0000 mL | Freq: Once | INTRAVENOUS | Status: AC | PRN
  Administered 2022-09-25: 75 mL via INTRAVENOUS

## 2022-09-29 DIAGNOSIS — J45901 Unspecified asthma with (acute) exacerbation: Secondary | ICD-10-CM | POA: Diagnosis not present

## 2022-09-29 DIAGNOSIS — J9601 Acute respiratory failure with hypoxia: Secondary | ICD-10-CM | POA: Diagnosis not present

## 2022-09-30 ENCOUNTER — Ambulatory Visit (HOSPITAL_COMMUNITY): Payer: Medicare Other

## 2022-10-29 DIAGNOSIS — J9601 Acute respiratory failure with hypoxia: Secondary | ICD-10-CM | POA: Diagnosis not present

## 2022-10-29 DIAGNOSIS — J45901 Unspecified asthma with (acute) exacerbation: Secondary | ICD-10-CM | POA: Diagnosis not present

## 2022-11-04 DIAGNOSIS — R1314 Dysphagia, pharyngoesophageal phase: Secondary | ICD-10-CM | POA: Diagnosis not present

## 2022-11-04 DIAGNOSIS — D170 Benign lipomatous neoplasm of skin and subcutaneous tissue of head, face and neck: Secondary | ICD-10-CM | POA: Diagnosis not present

## 2022-11-04 DIAGNOSIS — J339 Nasal polyp, unspecified: Secondary | ICD-10-CM | POA: Diagnosis not present

## 2022-11-18 ENCOUNTER — Encounter: Payer: Self-pay | Admitting: Emergency Medicine

## 2022-11-18 ENCOUNTER — Ambulatory Visit (INDEPENDENT_AMBULATORY_CARE_PROVIDER_SITE_OTHER): Payer: Medicare Other | Admitting: Emergency Medicine

## 2022-11-18 VITALS — BP 136/78 | HR 50 | Temp 97.7°F | Ht 69.0 in | Wt 199.2 lb

## 2022-11-18 DIAGNOSIS — I152 Hypertension secondary to endocrine disorders: Secondary | ICD-10-CM

## 2022-11-18 DIAGNOSIS — E1169 Type 2 diabetes mellitus with other specified complication: Secondary | ICD-10-CM | POA: Diagnosis not present

## 2022-11-18 DIAGNOSIS — E785 Hyperlipidemia, unspecified: Secondary | ICD-10-CM

## 2022-11-18 DIAGNOSIS — E1159 Type 2 diabetes mellitus with other circulatory complications: Secondary | ICD-10-CM | POA: Diagnosis not present

## 2022-11-18 DIAGNOSIS — I255 Ischemic cardiomyopathy: Secondary | ICD-10-CM

## 2022-11-18 DIAGNOSIS — Z7984 Long term (current) use of oral hypoglycemic drugs: Secondary | ICD-10-CM | POA: Diagnosis not present

## 2022-11-18 LAB — LIPID PANEL
Cholesterol: 118 mg/dL (ref 0–200)
HDL: 51.4 mg/dL (ref >39.00)
LDL Cholesterol: 42 mg/dL (ref 0–99)
NonHDL: 67
Total CHOL/HDL Ratio: 2
Triglycerides: 126 mg/dL (ref 0.0–149.0)
VLDL: 25.2 mg/dL (ref 0.0–40.0)

## 2022-11-18 LAB — CBC WITH DIFFERENTIAL/PLATELET
Basophils Absolute: 0.1 10*3/uL (ref 0.0–0.1)
Basophils Relative: 1 % (ref 0.0–3.0)
Eosinophils Absolute: 0.4 10*3/uL (ref 0.0–0.7)
Eosinophils Relative: 6.4 % — ABNORMAL HIGH (ref 0.0–5.0)
HCT: 46.8 % (ref 39.0–52.0)
Hemoglobin: 15.7 g/dL (ref 13.0–17.0)
Lymphocytes Relative: 24.4 % (ref 12.0–46.0)
Lymphs Abs: 1.3 10*3/uL (ref 0.7–4.0)
MCHC: 33.6 g/dL (ref 30.0–36.0)
MCV: 87.3 fl (ref 78.0–100.0)
Monocytes Absolute: 0.5 10*3/uL (ref 0.1–1.0)
Monocytes Relative: 8.8 % (ref 3.0–12.0)
Neutro Abs: 3.3 10*3/uL (ref 1.4–7.7)
Neutrophils Relative %: 59.4 % (ref 43.0–77.0)
Platelets: 141 10*3/uL — ABNORMAL LOW (ref 150.0–400.0)
RBC: 5.36 Mil/uL (ref 4.22–5.81)
RDW: 13.4 % (ref 11.5–15.5)
WBC: 5.5 10*3/uL (ref 4.0–10.5)

## 2022-11-18 LAB — POCT GLYCOSYLATED HEMOGLOBIN (HGB A1C): Hemoglobin A1C: 6.3 % — AB (ref 4.0–5.6)

## 2022-11-18 LAB — COMPREHENSIVE METABOLIC PANEL
ALT: 9 U/L (ref 0–53)
AST: 13 U/L (ref 0–37)
Albumin: 4.3 g/dL (ref 3.5–5.2)
Alkaline Phosphatase: 54 U/L (ref 39–117)
BUN: 15 mg/dL (ref 6–23)
CO2: 23 mEq/L (ref 19–32)
Calcium: 9.3 mg/dL (ref 8.4–10.5)
Chloride: 108 mEq/L (ref 96–112)
Creatinine, Ser: 0.93 mg/dL (ref 0.40–1.50)
GFR: 83.6 mL/min (ref >60.00)
Glucose, Bld: 79 mg/dL (ref 70–99)
Potassium: 4.1 mEq/L (ref 3.5–5.1)
Sodium: 141 mEq/L (ref 135–145)
Total Bilirubin: 0.4 mg/dL (ref 0.2–1.2)
Total Protein: 6.7 g/dL (ref 6.0–8.3)

## 2022-11-18 NOTE — Assessment & Plan Note (Signed)
Well-controlled hypertension. Continue amlodipine 5 mg, lisinopril 40 mg, and carvedilol 25 mg twice a day Well-controlled diabetes with hemoglobin A1c of 6.3 Continue metformin 500 mg twice a day along with glipizide 5 mg with breakfast and daily Farxiga 10 mg Cardiovascular risks associated with hypertension and diabetes discussed Diet and nutrition discussed Follow-up in 6 months

## 2022-11-18 NOTE — Patient Instructions (Signed)
Health Maintenance After Age 70 After age 70, you are at a higher risk for certain long-term diseases and infections as well as injuries from falls. Falls are a major cause of broken bones and head injuries in people who are older than age 70. Getting regular preventive care can help to keep you healthy and well. Preventive care includes getting regular testing and making lifestyle changes as recommended by your health care provider. Talk with your health care provider about: Which screenings and tests you should have. A screening is a test that checks for a disease when you have no symptoms. A diet and exercise plan that is right for you. What should I know about screenings and tests to prevent falls? Screening and testing are the best ways to find a health problem early. Early diagnosis and treatment give you the best chance of managing medical conditions that are common after age 70. Certain conditions and lifestyle choices may make you more likely to have a fall. Your health care provider may recommend: Regular vision checks. Poor vision and conditions such as cataracts can make you more likely to have a fall. If you wear glasses, make sure to get your prescription updated if your vision changes. Medicine review. Work with your health care provider to regularly review all of the medicines you are taking, including over-the-counter medicines. Ask your health care provider about any side effects that may make you more likely to have a fall. Tell your health care provider if any medicines that you take make you feel dizzy or sleepy. Strength and balance checks. Your health care provider may recommend certain tests to check your strength and balance while standing, walking, or changing positions. Foot health exam. Foot pain and numbness, as well as not wearing proper footwear, can make you more likely to have a fall. Screenings, including: Osteoporosis screening. Osteoporosis is a condition that causes  the bones to get weaker and break more easily. Blood pressure screening. Blood pressure changes and medicines to control blood pressure can make you feel dizzy. Depression screening. You may be more likely to have a fall if you have a fear of falling, feel depressed, or feel unable to do activities that you used to do. Alcohol use screening. Using too much alcohol can affect your balance and may make you more likely to have a fall. Follow these instructions at home: Lifestyle Do not drink alcohol if: Your health care provider tells you not to drink. If you drink alcohol: Limit how much you have to: 0-1 drink a day for women. 0-2 drinks a day for men. Know how much alcohol is in your drink. In the [place] one drink equals one 12 oz bottle of beer (355 mL), one 5 oz glass of wine (148 mL), or one 1 oz glass of hard liquor (44 mL). Do not use any products that contain nicotine or tobacco. These products include cigarettes, chewing tobacco, and vaping devices, such as e-cigarettes. If you need help quitting, ask your health care provider. Activity  Follow a regular exercise program to stay fit. This will help you maintain your balance. Ask your health care provider what types of exercise are appropriate for you. If you need a cane or walker, use it as recommended by your health care provider. Wear supportive shoes that have nonskid soles. Safety  Remove any tripping hazards, such as rugs, cords, and clutter. Install safety equipment such as grab bars in bathrooms and safety rails on stairs. Keep rooms and walkways   well-lit. General instructions Talk with your health care provider about your risks for falling. Tell your health care provider if: You fall. Be sure to tell your health care provider about all falls, even ones that seem minor. You feel dizzy, tiredness (fatigue), or off-balance. Take over-the-counter and prescription medicines only as told by your health care provider. These include  supplements. Eat a healthy diet and maintain a healthy weight. A healthy diet includes low-fat dairy products, low-fat (lean) meats, and fiber from whole grains, beans, and lots of fruits and vegetables. Stay current with your vaccines. Schedule regular health, dental, and eye exams. Summary Having a healthy lifestyle and getting preventive care can help to protect your health and wellness after age 70. Screening and testing are the best way to find a health problem early and help you avoid having a fall. Early diagnosis and treatment give you the best chance for managing medical conditions that are more common for people who are older than age 70. Falls are a major cause of broken bones and head injuries in people who are older than age 70. Take precautions to prevent a fall at home. Work with your health care provider to learn what changes you can make to improve your health and wellness and to prevent falls. This information is not intended to replace advice given to you by your health care provider. Make sure you discuss any questions you have with your health care provider. Document Revised: 11/06/2020 Document Reviewed: 11/06/2020 Elsevier Patient Education  2023 [employer] Inc.  

## 2022-11-18 NOTE — Assessment & Plan Note (Signed)
Stable.  No recent anginal episodes. Continues daily baby aspirin along with carvedilol 25 mg twice a day

## 2022-11-18 NOTE — Assessment & Plan Note (Signed)
Stable chronic condition. Continue rosuvastatin 20 mg daily. Diet and nutrition discussed.

## 2022-11-18 NOTE — Progress Notes (Signed)
Scott Dodson 72 y.o.   Chief Complaint  Patient presents with   Medical Management of Chronic Issues    f/u appt, no concerns     HISTORY OF PRESENT ILLNESS: This is a 72 y.o. male here for 12-month follow-up of chronic medical conditions including diabetes and hypertension Overall doing well. Has no complaints or medical concerns today.  HPI   Prior to Admission medications   Medication Sig Start Date End Date Taking? Authorizing Provider  albuterol (VENTOLIN HFA) 108 (90 Base) MCG/ACT inhaler Inhale 2 puffs into the lungs every 6 (six) hours as needed for wheezing or shortness of breath. 06/30/22  Yes Burnadette Pop, MD  amLODipine (NORVASC) 5 MG tablet TAKE 1 TABLET BY MOUTH DAILY 06/05/22  Yes Jaleiyah Alas, Eilleen Kempf, MD  Ascorbic Acid (VITAMIN C) 1000 MG tablet Take 2,000 mg by mouth daily.   Yes [provider]  aspirin 81 MG tablet Take 81 mg by mouth daily.   Yes [provider]  carvedilol (COREG) 25 MG tablet Take 1 tablet (25 mg total) by mouth 2 (two) times daily with a meal. TAKE 1 TABLET BY MOUTH  TWICE DAILY WITH MEALS 05/21/22 05/16/23 Yes Jaden Abreu, Eilleen Kempf, MD  cetirizine (ZYRTEC ALLERGY) 10 MG tablet Take 1 tablet (10 mg total) by mouth at bedtime. 06/19/22 12/16/22 Yes Theadora Rama Scales, PA-C  Coenzyme Q10 (CO Q 10 PO) Take 1 tablet by mouth daily.   Yes [provider]  dapagliflozin propanediol (FARXIGA) 10 MG TABS tablet Take 1 tablet (10 mg total) by mouth daily before breakfast. 05/21/22 05/16/23 Yes Tykeshia Tourangeau, Eilleen Kempf, MD  fluticasone [facility]) 50 MCG/ACT nasal spray Place 1 spray into both nostrils daily. Begin by using 2 sprays in each nare daily for 3 to 5 days, then decrease to 1 spray in each nare daily. Patient taking differently: Place 1 spray into both nostrils daily. 06/19/22  Yes Theadora Rama Scales, PA-C  glipiZIDE (GLUCOTROL) 5 MG tablet Take 1 tablet (5 mg total) by mouth daily with breakfast. 05/21/22   Yes Franchot Pollitt, Eilleen Kempf, MD  lisinopril (ZESTRIL) 40 MG tablet Take 1 tablet (40 mg total) by mouth daily. 05/21/22  Yes Marinus Eicher, Eilleen Kempf, MD  metFORMIN (GLUCOPHAGE) 500 MG tablet Take 1 tablet (500 mg total) by mouth 2 (two) times daily with a meal. 05/21/22  Yes Arzu Mcgaughey, Eilleen Kempf, MD  rosuvastatin (CRESTOR) 20 MG tablet Take 1 tablet (20 mg total) by mouth daily. 05/21/22  Yes Kemesha Mosey, Eilleen Kempf, MD  Ipratropium-Albuterol (COMBIVENT RESPIMAT) 20-100 MCG/ACT AERS respimat Inhale 1 puff into the lungs every 6 (six) hours. 06/19/22 07/19/22  Theadora Rama Scales, PA-C    No Known Allergies  Patient Active Problem List   Diagnosis Date Noted   HLD (hyperlipidemia) 06/28/2022   HTN (hypertension) 06/28/2022   Acute asthma exacerbation 06/28/2022   Ischemic cardiomyopathy 05/21/2022   Esophageal dysphagia 05/21/2022   Chronic bilateral low back pain without sciatica 05/21/2022   Chronic left hip pain 05/21/2022   Age-related nuclear cataract of both eyes 02/05/2021   Vitreomacular adhesion of left eye 12/13/2019   Severe nonproliferative diabetic retinopathy of right eye without macular edema associated with type 2 diabetes mellitus (HCC) 12/13/2019   Severe nonproliferative diabetic retinopathy of left eye, with macular edema, associated with type 2 diabetes mellitus (HCC) 12/13/2019   Vitreomacular adhesion of right eye 12/13/2019   History of diabetes mellitus 02/09/2018   CARDIOMYOPATHY, ISCHEMIC 05/18/2010   Coronary atherosclerosis 10/11/2009   Dyslipidemia associated with  type 2 diabetes mellitus (HCC) 10/09/2009   HYPERLIPIDEMIA 10/09/2009   Hypertension associated with diabetes (HCC) 10/09/2009   MYOCARDIAL INFARCTION, HX OF 10/09/2009    Past Medical History:  Diagnosis Date   Allergy    Asthma    Coronary artery disease    s/p lateral MI 4 /11, s/p Promus DES OM2   Diabetes mellitus without complication (HCC)    Heart murmur    Hyperlipidemia     Hypertension    Myocardial infarct (HCC) 2011    Past Surgical History:  Procedure Laterality Date   ADENOIDECTOMY     CORONARY ARTERY BYPASS GRAFT  2011   CORONARY STENT PLACEMENT     TONSILLECTOMY     TYMPANIC MEMBRANE REPAIR Right 2004   UPPER GASTROINTESTINAL ENDOSCOPY     VASECTOMY      Social History   Socioeconomic History   Marital status: Married    Spouse name: Not on file   Number of children: 3   Years of education: Not on file   Highest education level: Not on file  Occupational History   Occupation: Company secretary   Occupation: retired  Tobacco Use   Smoking status: Former    Packs/day: 0.50    Years: 5.00    Additional pack years: 0.00    Total pack years: 2.50    Types: Cigarettes    Quit date: 10/14/1974    Years since quitting: 48.1   Smokeless tobacco: Never  Vaping Use   Vaping Use: Never used  Substance and Sexual Activity   Alcohol use: Yes    Alcohol/week: 5.0 standard drinks of alcohol    Types: 5 Standard drinks or equivalent per week   Drug use: No   Sexual activity: Yes  Other Topics Concern   Not on file  Social History Narrative   Occupation- Writer    Former -tobacco -stopped 1979   Married ,3 children     Alcohol use - yes , social    Drug use - no   Regular exercise- yes    Social Determinants of Health   Financial Resource Strain: Low Risk  (10/14/2018)   Overall Financial Resource Strain (CARDIA)    Difficulty of Paying Living Expenses: Not hard at all  Food Insecurity: No Food Insecurity (07/03/2022)   Hunger Vital Sign    Worried About Running Out of Food in the Last Year: Never true    Ran Out of Food in the Last Year: Never true  Transportation Needs: No Transportation Needs (07/03/2022)   PRAPARE - Administrator, Civil Service (Medical): No    Lack of Transportation (Non-Medical): No  Physical Activity: Sufficiently Active (10/14/2018)   Exercise Vital Sign    Days of  Exercise per Week: 3 days    Minutes of Exercise per Session: 100 min  Stress: No Stress Concern Present (10/14/2018)   [employer] of Occupational Health - Occupational Stress Questionnaire    Feeling of Stress : Not at all  Social Connections: Moderately Integrated (10/14/2018)   Social Connection and Isolation Panel [NHANES]    Frequency of Communication with Friends and Family: Once a week    Frequency of Social Gatherings with Friends and Family: Once a week    Attends Religious Services: More than 4 times per year    Active Member of [employer] or Organizations: Yes    Attends Banker Meetings: 1 to 4 times per year    Marital  Status: Married  Catering manager Violence: Not At Risk (06/28/2022)   Humiliation, Afraid, Rape, and Kick questionnaire    Fear of Current or Ex-Partner: No    Emotionally Abused: No    Physically Abused: No    Sexually Abused: No    Family History  Problem Relation Age of Onset   Heart disease Mother    Hypertension Mother    Heart disease Father    Hypertension Father    Heart disease Brother    Heart disease Sister    Colon cancer Maternal Grandfather 51   Colon polyps Neg Hx    Esophageal cancer Neg Hx    Stomach cancer Neg Hx    Rectal cancer Neg Hx      Review of Systems  Constitutional: Negative.  Negative for chills and fever.  HENT: Negative.  Negative for congestion and sore throat.   Respiratory: Negative.  Negative for cough and shortness of breath.   Cardiovascular: Negative.  Negative for chest pain and palpitations.  Gastrointestinal:  Negative for abdominal pain, diarrhea, nausea and vomiting.  Genitourinary: Negative.  Negative for dysuria and hematuria.  Skin: Negative.  Negative for rash.  Neurological: Negative.  Negative for dizziness and headaches.  All other systems reviewed and are negative.   Vitals:   11/18/22 0809  BP: 136/78  Pulse: (!) 50  Temp: 97.7 F (36.5 C)  SpO2: 98%    Physical  Exam Vitals reviewed.  Constitutional:      Appearance: Normal appearance.  HENT:     Head: Normocephalic.     Right Ear: Tympanic membrane, ear canal and external ear normal.     Left Ear: Tympanic membrane, ear canal and external ear normal.     Mouth/Throat:     Mouth: Mucous membranes are moist.     Pharynx: Oropharynx is clear.  Eyes:     Extraocular Movements: Extraocular movements intact.     Conjunctiva/sclera: Conjunctivae normal.     Pupils: Pupils are equal, round, and reactive to light.  Cardiovascular:     Rate and Rhythm: Normal rate and regular rhythm.     Pulses: Normal pulses.     Heart sounds: Normal heart sounds.  Pulmonary:     Effort: Pulmonary effort is normal.     Breath sounds: Normal breath sounds.  Abdominal:     Palpations: Abdomen is soft.     Tenderness: There is no abdominal tenderness.  Musculoskeletal:     Cervical back: No tenderness.     Right lower leg: No edema.     Left lower leg: No edema.  Lymphadenopathy:     Cervical: No cervical adenopathy.  Skin:    General: Skin is warm and dry.     Capillary Refill: Capillary refill takes less than 2 seconds.  Neurological:     General: No focal deficit present.     Mental Status: He is alert and oriented to person, place, and time.  Psychiatric:        Mood and Affect: Mood normal.        Behavior: Behavior normal.      ASSESSMENT & PLAN: A total of 45 minutes was spent with the patient and counseling/coordination of care regarding preparing for this visit, review of most recent office visit notes, review of most recent blood work results including interpretation of today's hemoglobin A1c, review of multiple chronic medical conditions under management, review of all medications, cardiovascular risks associated with hypertension and diabetes, education on nutrition,  prognosis, documentation and need for follow-up.  Problem List Items Addressed This Visit       Cardiovascular and  Mediastinum   Hypertension associated with diabetes (HCC) - Primary    Well-controlled hypertension. Continue amlodipine 5 mg, lisinopril 40 mg, and carvedilol 25 mg twice a day Well-controlled diabetes with hemoglobin A1c of 6.3 Continue metformin 500 mg twice a day along with glipizide 5 mg with breakfast and daily Farxiga 10 mg Cardiovascular risks associated with hypertension and diabetes discussed Diet and nutrition discussed Follow-up in 6 months      Relevant Orders   POCT glycosylated hemoglobin (Hb A1C)   CBC with Differential/Platelet   Comprehensive metabolic panel   Lipid panel   Ischemic cardiomyopathy    Stable.  No recent anginal episodes. Continues daily baby aspirin along with carvedilol 25 mg twice a day      Relevant Orders   CBC with Differential/Platelet   Comprehensive metabolic panel   Lipid panel     Endocrine   Dyslipidemia associated with type 2 diabetes mellitus (HCC)    Stable chronic condition. Continue rosuvastatin 20 mg daily. Diet and nutrition discussed.      Relevant Orders   CBC with Differential/Platelet   Comprehensive metabolic panel   Lipid panel   Patient Instructions  Health Maintenance After Age 18 After age 19, you are at a higher risk for certain long-term diseases and infections as well as injuries from falls. Falls are a major cause of broken bones and head injuries in people who are older than age 37. Getting regular preventive care can help to keep you healthy and well. Preventive care includes getting regular testing and making lifestyle changes as recommended by your health care provider. Talk with your health care provider about: Which screenings and tests you should have. A screening is a test that checks for a disease when you have no symptoms. A diet and exercise plan that is right for you. What should I know about screenings and tests to prevent falls? Screening and testing are the best ways to find a health problem  early. Early diagnosis and treatment give you the best chance of managing medical conditions that are common after age 20. Certain conditions and lifestyle choices may make you more likely to have a fall. Your health care provider may recommend: Regular vision checks. Poor vision and conditions such as cataracts can make you more likely to have a fall. If you wear glasses, make sure to get your prescription updated if your vision changes. Medicine review. Work with your health care provider to regularly review all of the medicines you are taking, including over-the-counter medicines. Ask your health care provider about any side effects that may make you more likely to have a fall. Tell your health care provider if any medicines that you take make you feel dizzy or sleepy. Strength and balance checks. Your health care provider may recommend certain tests to check your strength and balance while standing, walking, or changing positions. Foot health exam. Foot pain and numbness, as well as not wearing proper footwear, can make you more likely to have a fall. Screenings, including: Osteoporosis screening. Osteoporosis is a condition that causes the bones to get weaker and break more easily. Blood pressure screening. Blood pressure changes and medicines to control blood pressure can make you feel dizzy. Depression screening. You may be more likely to have a fall if you have a fear of falling, feel depressed, or feel unable to do activities  that you used to do. Alcohol use screening. Using too much alcohol can affect your balance and may make you more likely to have a fall. Follow these instructions at home: Lifestyle Do not drink alcohol if: Your health care provider tells you not to drink. If you drink alcohol: Limit how much you have to: 0-1 drink a day for women. 0-2 drinks a day for men. Know how much alcohol is in your drink. In the [place] one drink equals one 12 oz bottle of beer (355 mL), one 5 oz  glass of wine (148 mL), or one 1 oz glass of hard liquor (44 mL). Do not use any products that contain nicotine or tobacco. These products include cigarettes, chewing tobacco, and vaping devices, such as e-cigarettes. If you need help quitting, ask your health care provider. Activity  Follow a regular exercise program to stay fit. This will help you maintain your balance. Ask your health care provider what types of exercise are appropriate for you. If you need a cane or walker, use it as recommended by your health care provider. Wear supportive shoes that have nonskid soles. Safety  Remove any tripping hazards, such as rugs, cords, and clutter. Install safety equipment such as grab bars in bathrooms and safety rails on stairs. Keep rooms and walkways well-lit. General instructions Talk with your health care provider about your risks for falling. Tell your health care provider if: You fall. Be sure to tell your health care provider about all falls, even ones that seem minor. You feel dizzy, tiredness (fatigue), or off-balance. Take over-the-counter and prescription medicines only as told by your health care provider. These include supplements. Eat a healthy diet and maintain a healthy weight. A healthy diet includes low-fat dairy products, low-fat (lean) meats, and fiber from whole grains, beans, and lots of fruits and vegetables. Stay current with your vaccines. Schedule regular health, dental, and eye exams. Summary Having a healthy lifestyle and getting preventive care can help to protect your health and wellness after age 53. Screening and testing are the best way to find a health problem early and help you avoid having a fall. Early diagnosis and treatment give you the best chance for managing medical conditions that are more common for people who are older than age 21. Falls are a major cause of broken bones and head injuries in people who are older than age 74. Take precautions to  prevent a fall at home. Work with your health care provider to learn what changes you can make to improve your health and wellness and to prevent falls. This information is not intended to replace advice given to you by your health care provider. Make sure you discuss any questions you have with your health care provider. Document Revised: 11/06/2020 Document Reviewed: 11/06/2020 Elsevier Patient Education  2023 Elsevier Inc.    Edwina Barth, MD [facility]

## 2022-11-29 DIAGNOSIS — J9601 Acute respiratory failure with hypoxia: Secondary | ICD-10-CM | POA: Diagnosis not present

## 2022-11-29 DIAGNOSIS — J45901 Unspecified asthma with (acute) exacerbation: Secondary | ICD-10-CM | POA: Diagnosis not present

## 2022-12-03 ENCOUNTER — Telehealth: Payer: Self-pay

## 2022-12-03 NOTE — Progress Notes (Signed)
   12/03/2022  Patient ID: Scott Dodson, male   DOB: 1950/09/12, 73 y.o.   MRN: [id number]  Patient outreach attempt to schedule telephone visit after patient appearing in quality report identifying failed measures in regard to adherence to diabetes, hypertension, and hyperlipidemia medications. Unable to reach patient but did leave voicemail with my direct number. Will attempt outreach again next week.   Lenna Gilford, PharmD, DPLA

## 2022-12-25 ENCOUNTER — Ambulatory Visit (INDEPENDENT_AMBULATORY_CARE_PROVIDER_SITE_OTHER): Payer: Medicare Other

## 2022-12-25 VITALS — Ht 69.0 in | Wt 197.0 lb

## 2022-12-25 DIAGNOSIS — Z0000 Encounter for general adult medical examination without abnormal findings: Secondary | ICD-10-CM

## 2022-12-25 NOTE — Progress Notes (Signed)
Subjective:   Scott Dodson is a 72 y.o. male who presents for Medicare Annual/Subsequent preventive examination.  Visit Complete: Virtual  I connected with  Clelia Croft on 12/25/22 by a audio enabled telemedicine application and verified that I am speaking with the correct person using two identifiers.  Patient Location: Home  Provider Location: Office/Clinic  I discussed the limitations of evaluation and management by telemedicine. The patient expressed understanding and agreed to proceed.  Review of Systems     Cardiac Risk Factors include: advanced age (>66men, >42 women);diabetes mellitus;dyslipidemia;family history of premature cardiovascular disease;hypertension;male gender     Objective:    Today's Vitals   12/25/22 1548  Weight: 197 lb (89.4 kg)  Height: 5\' 9"  (1.753 m)  PainSc: 0-No pain   Body mass index is 29.09 kg/m.     12/25/2022    3:50 PM 06/28/2022    5:25 PM 06/28/2022   12:57 PM 06/07/2022    8:54 AM 08/11/2019    8:06 AM 10/14/2018   10:28 AM  Advanced Directives  Does Patient Have a Medical Advance Directive? Yes Yes Yes Yes Yes Yes  Type of Estate agent of [place];Living will Healthcare Power of [employer] Power of [employer] Power of [employer] Power of Attorney  Does patient want to make changes to medical advance directive?  No - Patient declined No - Patient declined No - Patient declined No - Patient declined Yes (MAU/Ambulatory/Procedural Areas - Information given)  Copy of Healthcare Power of Attorney in Chart? No - copy requested No - copy requested  No - copy requested No - copy requested No - copy requested  Would patient like information on creating a medical advance directive?  No - Patient declined  No - Patient declined No - Patient declined     Current Medications (verified) Outpatient Encounter Medications as of 12/25/2022  Medication Sig   albuterol (VENTOLIN HFA) 108 (90 Base)  MCG/ACT inhaler Inhale 2 puffs into the lungs every 6 (six) hours as needed for wheezing or shortness of breath.   amLODipine (NORVASC) 5 MG tablet TAKE 1 TABLET BY MOUTH DAILY   Ascorbic Acid (VITAMIN C) 1000 MG tablet Take 2,000 mg by mouth daily.   aspirin 81 MG tablet Take 81 mg by mouth daily.   carvedilol (COREG) 25 MG tablet Take 1 tablet (25 mg total) by mouth 2 (two) times daily with a meal. TAKE 1 TABLET BY MOUTH  TWICE DAILY WITH MEALS   cetirizine (ZYRTEC ALLERGY) 10 MG tablet Take 1 tablet (10 mg total) by mouth at bedtime.   Coenzyme Q10 (CO Q 10 PO) Take 1 tablet by mouth daily.   dapagliflozin propanediol (FARXIGA) 10 MG TABS tablet Take 1 tablet (10 mg total) by mouth daily before breakfast.   fluticasone (FLONASE) 50 MCG/ACT nasal spray Place 1 spray into both nostrils daily. Begin by using 2 sprays in each nare daily for 3 to 5 days, then decrease to 1 spray in each nare daily. (Patient taking differently: Place 1 spray into both nostrils daily.)   glipiZIDE (GLUCOTROL) 5 MG tablet Take 1 tablet (5 mg total) by mouth daily with breakfast.   Ipratropium-Albuterol (COMBIVENT RESPIMAT) 20-100 MCG/ACT AERS respimat Inhale 1 puff into the lungs every 6 (six) hours.   lisinopril (ZESTRIL) 40 MG tablet Take 1 tablet (40 mg total) by mouth daily.   metFORMIN (GLUCOPHAGE) 500 MG tablet Take 1 tablet (500 mg total) by mouth 2 (two) times daily with  a meal.   rosuvastatin (CRESTOR) 20 MG tablet Take 1 tablet (20 mg total) by mouth daily.   No facility-administered encounter medications on file as of 12/25/2022.    Allergies (verified) Patient has no known allergies.   History: Past Medical History:  Diagnosis Date   Allergy    Asthma    Coronary artery disease    s/p lateral MI 4 /11, s/p Promus DES OM2   Diabetes mellitus without complication (HCC)    Heart murmur    Hyperlipidemia    Hypertension    Myocardial infarct (HCC) 2011   Past Surgical History:  Procedure  Laterality Date   ADENOIDECTOMY     CORONARY ARTERY BYPASS GRAFT  2011   CORONARY STENT PLACEMENT     TONSILLECTOMY     TYMPANIC MEMBRANE REPAIR Right 2004   UPPER GASTROINTESTINAL ENDOSCOPY     VASECTOMY     Family History  Problem Relation Age of Onset   Heart disease Mother    Hypertension Mother    Heart disease Father    Hypertension Father    Heart disease Brother    Heart disease Sister    Colon cancer Maternal Grandfather 7   Colon polyps Neg Hx    Esophageal cancer Neg Hx    Stomach cancer Neg Hx    Rectal cancer Neg Hx    Social History   Socioeconomic History   Marital status: Married    Spouse name: Not on file   Number of children: 3   Years of education: Not on file   Highest education level: Not on file  Occupational History   Occupation: Company secretary   Occupation: retired  Tobacco Use   Smoking status: Former    Packs/day: 0.50    Years: 5.00    Additional pack years: 0.00    Total pack years: 2.50    Types: Cigarettes    Quit date: 10/14/1974    Years since quitting: 48.2   Smokeless tobacco: Never  Vaping Use   Vaping Use: Never used  Substance and Sexual Activity   Alcohol use: Yes    Alcohol/week: 5.0 standard drinks of alcohol    Types: 5 Standard drinks or equivalent per week   Drug use: No   Sexual activity: Yes  Other Topics Concern   Not on file  Social History Narrative   Occupation- Writer    Former -tobacco -stopped 1979   Married ,3 children     Alcohol use - yes , social    Drug use - no   Regular exercise- yes    Social Determinants of Health   Financial Resource Strain: Low Risk  (12/25/2022)   Overall Financial Resource Strain (CARDIA)    Difficulty of Paying Living Expenses: Not hard at all  Food Insecurity: No Food Insecurity (12/25/2022)   Hunger Vital Sign    Worried About Running Out of Food in the Last Year: Never true    Ran Out of Food in the Last Year: Never true   Transportation Needs: No Transportation Needs (12/25/2022)   PRAPARE - Administrator, Civil Service (Medical): No    Lack of Transportation (Non-Medical): No  Physical Activity: Sufficiently Active (12/25/2022)   Exercise Vital Sign    Days of Exercise per Week: 3 days    Minutes of Exercise per Session: 100 min  Stress: No Stress Concern Present (12/25/2022)   [employer] of Occupational Health - Occupational Stress Questionnaire  Feeling of Stress : Not at all  Social Connections: Moderately Integrated (12/25/2022)   Social Connection and Isolation Panel [NHANES]    Frequency of Communication with Friends and Family: Once a week    Frequency of Social Gatherings with Friends and Family: Once a week    Attends Religious Services: More than 4 times per year    Active Member of [employer] or Organizations: Yes    Attends Banker Meetings: 1 to 4 times per year    Marital Status: Married    Tobacco Counseling Counseling given: Not Answered   Clinical Intake:  Pre-visit preparation completed: Yes  Pain : No/denies pain Pain Score: 0-No pain     BMI - recorded: 29.09 Nutritional Status: BMI 25 -29 Overweight Nutritional Risks: None Diabetes: Yes CBG done?: No Did pt. bring in CBG monitor from home?: No  How often do you need to have someone help you when you read instructions, pamphlets, or other written materials from your doctor or pharmacy?: 1 - Never What is the last grade level you completed in school?: SOME COLLEGE (2 YEARS)  Interpreter Needed?: No  Information entered by :: Nosson Wender N. Lorn Butcher, LPN.   Activities of Daily Living    12/25/2022    3:54 PM 06/28/2022    5:25 PM  In your present state of health, do you have any difficulty performing the following activities:  Hearing? 0 0  Vision? 0 0  Difficulty concentrating or making decisions? 0 0  Walking or climbing stairs? 0 0  Dressing or bathing? 0 0  Doing errands,  shopping? 0 0  Preparing Food and eating ? N   Using the Toilet? N   In the past six months, have you accidently leaked urine? N   Do you have problems with loss of bowel control? N   Managing your Medications? N   Managing your Finances? N   Housekeeping or managing your Housekeeping? N     Patient Care Team: Georgina Quint, MD as PCP - General (Internal Medicine)  Indicate any recent Medical Services you may have received from other than [facility] providers in the past year (date may be approximate).     Assessment:   This is a routine wellness examination for Husam.  Hearing/Vision screen Hearing Screening - Comments:: Patient wear ear buds when watching tv. Patient has tinnitus in right ear. Hearing evaluation was [facility] at [facility].  No hearing aids at this time.  Vision Screening - Comments:: Wears rx glasses for reading - up to date with routine eye exams with [facility].   Dietary issues and exercise activities discussed:     Goals Addressed   None   Depression Screen    12/25/2022    3:59 PM 11/18/2022    8:10 AM 07/10/2022    9:41 AM 08/17/2020    8:06 AM 04/26/2020   11:26 AM 02/15/2020    8:13 AM 08/17/2019    8:15 AM  PHQ 2/9 Scores  PHQ - 2 Score 0 0 0 0 0 0 0  PHQ- 9 Score 0          Fall Risk    12/25/2022    3:51 PM 11/18/2022    8:10 AM 07/10/2022    9:41 AM 08/17/2020    8:24 AM 08/17/2020    8:06 AM  Fall Risk   Falls in the past year? 0 0 0 0 1  Number falls in past yr: 0 0 0 0 1  Injury with Fall? 0 0 0 0 1  Comment     bruised right leg  Risk for fall due to : No Fall Risks No Fall Risks No Fall Risks    Follow up Falls prevention discussed Falls evaluation completed Falls evaluation completed      MEDICARE RISK AT HOME:  Medicare Risk at Home - 12/25/22 1550     Any stairs in or around the home? Yes    If so, are there any without handrails? No    Home free of loose throw rugs in walkways, pet beds, electrical cords, etc? Yes     Adequate lighting in your home to reduce risk of falls? Yes    Life alert? No    Use of a cane, walker or w/c? No    Grab bars in the bathroom? No    Shower chair or bench in shower? No    Elevated toilet seat or a handicapped toilet? No             TIMED UP AND GO:  Was the test performed?  No    Cognitive Function:        12/25/2022    3:53 PM 08/11/2019    8:04 AM 10/14/2018   10:31 AM  6CIT Screen  What Year? 0 points 0 points 0 points  What month? 0 points 0 points 0 points  What time? 0 points 0 points 0 points  Count back from 20 0 points 0 points 0 points  Months in reverse 0 points 0 points 0 points  Repeat phrase 0 points 0 points 0 points  Total Score 0 points 0 points 0 points    Immunizations Immunization History  Administered Date(s) Administered   PFIZER(Purple Top)SARS-COV-2 Vaccination 02/15/2020, 03/07/2020   Pneumococcal Conjugate-13 08/12/2018   Pneumococcal Polysaccharide-23 02/15/2020   Td 01/17/2010   Tdap 08/10/2012   Zoster Recombinat (Shingrix) 08/13/2018, 03/05/2019    TDAP status: Due, Education has been provided regarding the importance of this vaccine. Advised may receive this vaccine at local pharmacy or Health Dept. Aware to provide a copy of the vaccination record if obtained from local pharmacy or Health Dept. Verbalized acceptance and understanding.  Flu Vaccine status: Declined, Education has been provided regarding the importance of this vaccine but patient still declined. Advised may receive this vaccine at local pharmacy or Health Dept. Aware to provide a copy of the vaccination record if obtained from local pharmacy or Health Dept. Verbalized acceptance and understanding.  Pneumococcal vaccine status: Up to date  Covid-19 vaccine status: Completed vaccines  Qualifies for Shingles Vaccine? Yes   Zostavax completed No   Shingrix Completed?: Yes  Screening Tests Health Maintenance  Topic Date Due   FOOT EXAM  08/16/2020    COVID-19 Vaccine (3 - 2023-24 season) 03/01/2022   HEMOGLOBIN A1C  05/21/2023   Diabetic kidney evaluation - Urine ACR  05/22/2023   OPHTHALMOLOGY EXAM  09/02/2023   Diabetic kidney evaluation - eGFR measurement  11/18/2023   Medicare Annual Wellness (AWV)  12/25/2023   Colonoscopy  11/15/2025   Pneumonia Vaccine 62+ Years old  Completed   Hepatitis C Screening  Completed   Zoster Vaccines- Shingrix  Completed   HPV VACCINES  Aged Out   DTaP/Tdap/Td  Discontinued    Health Maintenance  Health Maintenance Due  Topic Date Due   FOOT EXAM  08/16/2020   COVID-19 Vaccine (3 - 2023-24 season) 03/01/2022    Colorectal cancer screening: Type of screening: Colonoscopy.  Completed 11/16/2018. Repeat every 7 years  Lung Cancer Screening: (Low Dose CT Chest recommended if Age 45-80 years, 20 pack-year currently smoking OR have quit w/in 15years.) does not qualify.   Lung Cancer Screening Referral: no  Additional Screening:  Hepatitis C Screening: does qualify; Completed 02/09/2018  Vision Screening: Recommended annual ophthalmology exams for early detection of glaucoma and other disorders of the eye. Is the patient up to date with their annual eye exam?  Yes  Who is the provider or what is the name of the office in which the patient attends annual eye exams? [facility] If pt is not established with a provider, would they like to be referred to a provider to establish care? No .   Dental Screening: Recommended annual dental exams for proper oral hygiene  Diabetic Foot Exam: Diabetic Foot Exam: Overdue, Pt has been advised about the importance in completing this exam. Pt is scheduled for diabetic foot exam on 05/26/2023.  Community Resource Referral / Chronic Care Management: CRR required this visit?  No   CCM required this visit?  No     Plan:     I have personally reviewed and noted the following in the patient's chart:   Medical and social history Use of alcohol,  tobacco or illicit drugs  Current medications and supplements including opioid prescriptions. Patient is not currently taking opioid prescriptions. Functional ability and status Nutritional status Physical activity Advanced directives List of other physicians Hospitalizations, surgeries, and ER visits in previous 12 months Vitals Screenings to include cognitive, depression, and falls Referrals and appointments  In addition, I have reviewed and discussed with patient certain preventive protocols, quality metrics, and best practice recommendations. A written personalized care plan for preventive services as well as general preventive health recommendations were provided to patient.     Mickeal Needy, LPN   03/14/7828   After Visit Summary: (Mail) Due to this being a telephonic visit, the after visit summary with patients personalized plan was offered to patient via mail   Nurse Notes: Normal cognitive status assessed by direct observation via telephone conversation by this Nurse Health Advisor. No abnormalities found.

## 2022-12-25 NOTE — Patient Instructions (Addendum)
Scott Dodson , Thank you for taking time to come for your Medicare Wellness Visit. I appreciate your ongoing commitment to your health goals. Please review the following plan we discussed and let me know if I can assist you in the future.   These are the goals we discussed:  Goals      My healthcare goal for 2024 is to keep on breathing.        This is a list of the screening recommended for you and due dates:  Health Maintenance  Topic Date Due   Complete foot exam   08/16/2020   COVID-19 Vaccine (3 - 2023-24 season) 03/01/2022   Hemoglobin A1C  05/21/2023   Yearly kidney health urinalysis for diabetes  05/22/2023   Eye exam for diabetics  09/02/2023   Yearly kidney function blood test for diabetes  11/18/2023   Medicare Annual Wellness Visit  12/25/2023   Colon Cancer Screening  11/15/2025   Pneumonia Vaccine  Completed   Hepatitis C Screening  Completed   Zoster (Shingles) Vaccine  Completed   HPV Vaccine  Aged Out   DTaP/Tdap/Td vaccine  Discontinued    Advanced directives: Yes  Conditions/risks identified: Yes  Next appointment:   Preventive Care 65 Years and Older, Male  Preventive care refers to lifestyle choices and visits with your health care provider that can promote health and wellness. What does preventive care include? A yearly physical exam. This is also called an annual well check. Dental exams once or twice a year. Routine eye exams. Ask your health care provider how often you should have your eyes checked. Personal lifestyle choices, including: Daily care of your teeth and gums. Regular physical activity. Eating a healthy diet. Avoiding tobacco and drug use. Limiting alcohol use. Practicing safe sex. Taking low doses of aspirin every day. Taking vitamin and mineral supplements as recommended by your health care provider. What happens during an annual well check? The services and screenings done by your health care provider during your annual well  check will depend on your age, overall health, lifestyle risk factors, and family history of disease. Counseling  Your health care provider may ask you questions about your: Alcohol use. Tobacco use. Drug use. Emotional well-being. Home and relationship well-being. Sexual activity. Eating habits. History of falls. Memory and ability to understand (cognition). Work and work Astronomer. Screening  You may have the following tests or measurements: Height, weight, and BMI. Blood pressure. Lipid and cholesterol levels. These may be checked every 5 years, or more frequently if you are over 49 years old. Skin check. Lung cancer screening. You may have this screening every year starting at age 60 if you have a 30-pack-year history of smoking and currently smoke or have quit within the past 15 years. Fecal occult blood test (FOBT) of the stool. You may have this test every year starting at age 38. Flexible sigmoidoscopy or colonoscopy. You may have a sigmoidoscopy every 5 years or a colonoscopy every 10 years starting at age 67. Prostate cancer screening. Recommendations will vary depending on your family history and other risks. Hepatitis C blood test. Hepatitis B blood test. Sexually transmitted disease (STD) testing. Diabetes screening. This is done by checking your blood sugar (glucose) after you have not eaten for a while (fasting). You may have this done every 1-3 years. Abdominal aortic aneurysm (AAA) screening. You may need this if you are a current or former smoker. Osteoporosis. You may be screened starting at age 13 if you  are at high risk. Talk with your health care provider about your test results, treatment options, and if necessary, the need for more tests. Vaccines  Your health care provider may recommend certain vaccines, such as: Influenza vaccine. This is recommended every year. Tetanus, diphtheria, and acellular pertussis (Tdap, Td) vaccine. You may need a Td booster  every 10 years. Zoster vaccine. You may need this after age 104. Pneumococcal 13-valent conjugate (PCV13) vaccine. One dose is recommended after age 66. Pneumococcal polysaccharide (PPSV23) vaccine. One dose is recommended after age 62. Talk to your health care provider about which screenings and vaccines you need and how often you need them. This information is not intended to replace advice given to you by your health care provider. Make sure you discuss any questions you have with your health care provider. Document Released: 07/14/2015 Document Revised: 03/06/2016 Document Reviewed: 04/18/2015 Elsevier Interactive Patient Education  2017 [employer].  Fall Prevention in the Home Falls can cause injuries. They can happen to people of all ages. There are many things you can do to make your home safe and to help prevent falls. What can I do on the outside of my home? Regularly fix the edges of walkways and driveways and fix any cracks. Remove anything that might make you trip as you walk through a door, such as a raised step or threshold. Trim any bushes or trees on the path to your home. Use bright outdoor lighting. Clear any walking paths of anything that might make someone trip, such as rocks or tools. Regularly check to see if handrails are loose or broken. Make sure that both sides of any steps have handrails. Any raised decks and porches should have guardrails on the edges. Have any leaves, snow, or ice cleared regularly. Use sand or salt on walking paths during winter. Clean up any spills in your garage right away. This includes oil or grease spills. What can I do in the bathroom? Use night lights. Install grab bars by the toilet and in the tub and shower. Do not use towel bars as grab bars. Use non-skid mats or decals in the tub or shower. If you need to sit down in the shower, use a plastic, non-slip stool. Keep the floor dry. Clean up any water that spills on the floor as soon  as it happens. Remove soap buildup in the tub or shower regularly. Attach bath mats securely with double-sided non-slip rug tape. Do not have throw rugs and other things on the floor that can make you trip. What can I do in the bedroom? Use night lights. Make sure that you have a light by your bed that is easy to reach. Do not use any sheets or blankets that are too big for your bed. They should not hang down onto the floor. Have a firm chair that has side arms. You can use this for support while you get dressed. Do not have throw rugs and other things on the floor that can make you trip. What can I do in the kitchen? Clean up any spills right away. Avoid walking on wet floors. Keep items that you use a lot in easy-to-reach places. If you need to reach something above you, use a strong step stool that has a grab bar. Keep electrical cords out of the way. Do not use floor polish or wax that makes floors slippery. If you must use wax, use non-skid floor wax. Do not have throw rugs and other things on the  floor that can make you trip. What can I do with my stairs? Do not leave any items on the stairs. Make sure that there are handrails on both sides of the stairs and use them. Fix handrails that are broken or loose. Make sure that handrails are as long as the stairways. Check any carpeting to make sure that it is firmly attached to the stairs. Fix any carpet that is loose or worn. Avoid having throw rugs at the top or bottom of the stairs. If you do have throw rugs, attach them to the floor with carpet tape. Make sure that you have a light switch at the top of the stairs and the bottom of the stairs. If you do not have them, ask someone to add them for you. What else can I do to help prevent falls? Wear shoes that: Do not have high heels. Have rubber bottoms. Are comfortable and fit you well. Are closed at the toe. Do not wear sandals. If you use a stepladder: Make sure that it is fully  opened. Do not climb a closed stepladder. Make sure that both sides of the stepladder are locked into place. Ask someone to hold it for you, if possible. Clearly mark and make sure that you can see: Any grab bars or handrails. First and last steps. Where the edge of each step is. Use tools that help you move around (mobility aids) if they are needed. These include: Canes. Walkers. Scooters. Crutches. Turn on the lights when you go into a dark area. Replace any light bulbs as soon as they burn out. Set up your furniture so you have a clear path. Avoid moving your furniture around. If any of your floors are uneven, fix them. If there are any pets around you, be aware of where they are. Review your medicines with your doctor. Some medicines can make you feel dizzy. This can increase your chance of falling. Ask your doctor what other things that you can do to help prevent falls. This information is not intended to replace advice given to you by your health care provider. Make sure you discuss any questions you have with your health care provider. Document Released: 04/13/2009 Document Revised: 11/23/2015 Document Reviewed: 07/22/2014 Elsevier Interactive Patient Education  2017 [employer].

## 2022-12-29 DIAGNOSIS — J9601 Acute respiratory failure with hypoxia: Secondary | ICD-10-CM | POA: Diagnosis not present

## 2022-12-29 DIAGNOSIS — J45901 Unspecified asthma with (acute) exacerbation: Secondary | ICD-10-CM | POA: Diagnosis not present

## 2023-01-03 ENCOUNTER — Other Ambulatory Visit: Payer: Self-pay | Admitting: Emergency Medicine

## 2023-01-03 DIAGNOSIS — I255 Ischemic cardiomyopathy: Secondary | ICD-10-CM

## 2023-01-03 DIAGNOSIS — I152 Hypertension secondary to endocrine disorders: Secondary | ICD-10-CM

## 2023-01-16 ENCOUNTER — Other Ambulatory Visit: Payer: Self-pay | Admitting: Emergency Medicine

## 2023-01-16 DIAGNOSIS — E1159 Type 2 diabetes mellitus with other circulatory complications: Secondary | ICD-10-CM

## 2023-01-16 DIAGNOSIS — E1169 Type 2 diabetes mellitus with other specified complication: Secondary | ICD-10-CM

## 2023-01-16 DIAGNOSIS — I255 Ischemic cardiomyopathy: Secondary | ICD-10-CM

## 2023-01-16 DIAGNOSIS — E1165 Type 2 diabetes mellitus with hyperglycemia: Secondary | ICD-10-CM

## 2023-01-16 DIAGNOSIS — I10 Essential (primary) hypertension: Secondary | ICD-10-CM

## 2023-01-29 DIAGNOSIS — J45901 Unspecified asthma with (acute) exacerbation: Secondary | ICD-10-CM | POA: Diagnosis not present

## 2023-01-29 DIAGNOSIS — J9601 Acute respiratory failure with hypoxia: Secondary | ICD-10-CM | POA: Diagnosis not present

## 2023-02-04 ENCOUNTER — Other Ambulatory Visit (HOSPITAL_COMMUNITY): Payer: Self-pay | Admitting: Otolaryngology

## 2023-02-04 DIAGNOSIS — R1314 Dysphagia, pharyngoesophageal phase: Secondary | ICD-10-CM

## 2023-02-04 DIAGNOSIS — D170 Benign lipomatous neoplasm of skin and subcutaneous tissue of head, face and neck: Secondary | ICD-10-CM | POA: Diagnosis not present

## 2023-02-17 ENCOUNTER — Other Ambulatory Visit (HOSPITAL_COMMUNITY): Payer: Self-pay | Admitting: *Deleted

## 2023-02-17 DIAGNOSIS — R1310 Dysphagia, unspecified: Secondary | ICD-10-CM

## 2023-02-21 ENCOUNTER — Other Ambulatory Visit: Payer: Self-pay | Admitting: Emergency Medicine

## 2023-02-21 DIAGNOSIS — I10 Essential (primary) hypertension: Secondary | ICD-10-CM

## 2023-02-21 DIAGNOSIS — E1159 Type 2 diabetes mellitus with other circulatory complications: Secondary | ICD-10-CM

## 2023-02-27 ENCOUNTER — Ambulatory Visit (HOSPITAL_COMMUNITY)
Admission: RE | Admit: 2023-02-27 | Discharge: 2023-02-27 | Disposition: A | Discharge status: Home / Self Care | Payer: Medicare Other | Source: Ambulatory Visit | Attending: Otolaryngology | Admitting: Otolaryngology

## 2023-02-27 ENCOUNTER — Ambulatory Visit (HOSPITAL_COMMUNITY)
Admission: RE | Admit: 2023-02-27 | Discharge: 2023-02-27 | Disposition: A | Discharge status: Home / Self Care | Payer: Medicare Other | Source: Ambulatory Visit | Attending: Emergency Medicine | Admitting: Emergency Medicine

## 2023-02-27 DIAGNOSIS — R1310 Dysphagia, unspecified: Secondary | ICD-10-CM | POA: Diagnosis not present

## 2023-02-27 DIAGNOSIS — R1314 Dysphagia, pharyngoesophageal phase: Secondary | ICD-10-CM | POA: Insufficient documentation

## 2023-03-01 DIAGNOSIS — J9601 Acute respiratory failure with hypoxia: Secondary | ICD-10-CM | POA: Diagnosis not present

## 2023-03-01 DIAGNOSIS — J45901 Unspecified asthma with (acute) exacerbation: Secondary | ICD-10-CM | POA: Diagnosis not present

## 2023-03-13 ENCOUNTER — Encounter (INDEPENDENT_AMBULATORY_CARE_PROVIDER_SITE_OTHER): Payer: Self-pay | Admitting: Otolaryngology

## 2023-03-13 ENCOUNTER — Ambulatory Visit (INDEPENDENT_AMBULATORY_CARE_PROVIDER_SITE_OTHER): Payer: Medicare Other | Admitting: Otolaryngology

## 2023-03-13 VITALS — BP 150/79 | HR 50 | Ht 69.0 in | Wt 198.0 lb

## 2023-03-13 DIAGNOSIS — R0981 Nasal congestion: Secondary | ICD-10-CM | POA: Diagnosis not present

## 2023-03-13 DIAGNOSIS — J329 Chronic sinusitis, unspecified: Secondary | ICD-10-CM

## 2023-03-13 DIAGNOSIS — R221 Localized swelling, mass and lump, neck: Secondary | ICD-10-CM | POA: Diagnosis not present

## 2023-03-13 DIAGNOSIS — J3489 Other specified disorders of nose and nasal sinuses: Secondary | ICD-10-CM

## 2023-03-13 DIAGNOSIS — K219 Gastro-esophageal reflux disease without esophagitis: Secondary | ICD-10-CM

## 2023-03-13 DIAGNOSIS — R1314 Dysphagia, pharyngoesophageal phase: Secondary | ICD-10-CM

## 2023-03-13 DIAGNOSIS — J3089 Other allergic rhinitis: Secondary | ICD-10-CM

## 2023-03-13 DIAGNOSIS — J339 Nasal polyp, unspecified: Secondary | ICD-10-CM

## 2023-03-13 DIAGNOSIS — J383 Other diseases of vocal cords: Secondary | ICD-10-CM | POA: Diagnosis not present

## 2023-03-13 DIAGNOSIS — R0982 Postnasal drip: Secondary | ICD-10-CM | POA: Diagnosis not present

## 2023-03-13 MED ORDER — AMOXICILLIN-POT CLAVULANATE 875-125 MG PO TABS: 1.0000 | ORAL_TABLET | Freq: Two times a day (BID) | ORAL | 0 refills | Status: DC

## 2023-03-13 MED ORDER — METHYLPREDNISOLONE 4 MG PO TBPK: ORAL_TABLET | ORAL | 1 refills | Status: DC

## 2023-03-13 MED ORDER — FLUTICASONE PROPIONATE 50 MCG/ACT NA SUSP: 2.0000 | Freq: Every day | NASAL | 6 refills | Status: AC

## 2023-03-13 MED ORDER — DESLORATADINE 5 MG PO TABS: 5.0000 mg | ORAL_TABLET | Freq: Every day | ORAL | 3 refills | Status: DC

## 2023-03-13 NOTE — Progress Notes (Signed)
ENT CONSULT:  Reason for Consult: neck mass and dysphagia  nasal congestion phlegm   HPI: Scott Dodson is an 72 y.o. male with hx of MI in 2011, history of diabetes, former smoking, quit in 70s, right TM reconstruction due to history of chronic ear infections performed, who is here for evaluation of longstanding chronic dysphagia symptoms and left anterior neck mass.   He is here for chronic dysphagia symptoms primarily with solids. He reports no issues with liquids. When he is eating solid foods it would get stuck in his throat. Eventually it all goes down. He has food regurgitation (undigested). Usually bread is hardest for him. Foods with particles and dry foods cause him to cough and choke.  This has been a problem for over 4-5 yrs.  He reports persistent phlegm in the back of his throat.  He also reports chronic nasal congestion and symptoms of difficulties with breathing through his nose.  He was evaluated by Dr. Ernestene Kiel, and had MBS and esophagram.  He also had CT neck.  I reviewed all imaging and summarized findings in assessment and plan.   She reports longstanding history of a lump along the left anterior neck, that did not bother him, but was noted by one of the physicians who saw him in the past, denies pain, denies significant changes in size of the lump. Of note CT soft tissue neck did not mention any lymphadenopathy or any evidence of anterior left neck mass that was performed on September 25, 2022.   Records Reviewed:  Dr Harvie Junior note from 11/04/22 Devian Tata is a 72 y.o. year old male who presents for evaluation of a left neck nodule.  The patient reports a non-painful lump on the left side of his neck, which he first noticed approximately a year ago. The nodule fluctuates in size, occasionally decreasing in size. He denies experiencing fevers, chills, night sweats, or weight loss.  The patient experiences dysphagia, particularly when consuming tablets and solids. He describes a  sensation of food becoming lodged in his throat. He has not yet scheduled his swallow study ordered by Gastroenterology. He denies experiencing reflux. Depending on the type of food consumed, he experiences choking and coughing.  He has no history of sinus surgery. He denies significant nasal congestion and generally breathes through his nose without difficulty. He experiences facial pain and pressure during colds. His olfactory function is normal. He experiences persistent throat drainage. He has Flonase available for use. He suffers from numerous allergies.  He smoked a long time ago. He drinks 2 alcoholic beverages per night. He is retired from his main job and now works part-time. He works for [employer].   GI records Dr. Amada Jupiter 08/30/22 Clinical details of this patient's symptoms are in my July 03, 2022 office consult note. About 2 years of chronic solid food dysphagia. Scott Dodson reports having noticed a lump in the left side of his neck for the last few weeks.    EGD   The examined esophagus was normal. The scope was                            withdrawn. Dilation was performed with a Maloney                            dilator with no resistance at 52 Fr.  The stomach was normal.                           The cardia and gastric fundus were normal on                            retroflexion. (Hill grade 1)                           The examined duodenum was normal.                           The larynx could not be well-visualized. Complications:            No immediate complications.   Past Medical History:  Diagnosis Date   Allergy    Asthma    Coronary artery disease    s/p lateral MI 4 /11, s/p Promus DES OM2   Diabetes mellitus without complication (HCC)    Heart murmur    Hyperlipidemia    Hypertension    Myocardial infarct [facility]) 2011    Past Surgical History:  Procedure Laterality Date   ADENOIDECTOMY     CORONARY ARTERY BYPASS  GRAFT  2011   CORONARY STENT PLACEMENT     TONSILLECTOMY     TYMPANIC MEMBRANE REPAIR Right 2004   UPPER GASTROINTESTINAL ENDOSCOPY     VASECTOMY      Family History  Problem Relation Age of Onset   Heart disease Mother    Hypertension Mother    Heart disease Father    Hypertension Father    Heart disease Brother    Heart disease Sister    Colon cancer Maternal Grandfather 51   Colon polyps Neg Hx    Esophageal cancer Neg Hx    Stomach cancer Neg Hx    Rectal cancer Neg Hx     Social History:  reports that he quit smoking about 48 years ago. His smoking use included cigarettes. He started smoking about 53 years ago. He has a 2.5 pack-year smoking history. He has never used smokeless tobacco. He reports current alcohol use of about 5.0 standard drinks of alcohol per week. He reports that he does not use drugs.  Allergies: No Known Allergies  Medications: I have reviewed the patient's current medications.  The PMH, PSH, Medications, Allergies, and SH were reviewed and updated.  ROS: Constitutional: Negative for fever, weight loss and weight gain. Cardiovascular: Negative for chest pain and dyspnea on exertion. Respiratory: Is not experiencing shortness of breath at rest. Gastrointestinal: Negative for nausea and vomiting. Neurological: Negative for headaches. Psychiatric: The patient is not nervous/anxious  Blood pressure (!) 150/79, pulse (!) 50, height 5\' 9"  (1.753 m), weight 198 lb (89.8 kg), SpO2 96%.  PHYSICAL EXAM:  Exam: General: Well-developed, well-nourished Respiratory Respiratory effort: Equal inspiration and expiration without stridor Cardiovascular Peripheral Vascular: Warm extremities with equal color/perfusion Eyes: No nystagmus with equal extraocular motion bilaterally Neuro/Psych/Balance: Patient oriented to person, place, and time; Appropriate mood and affect; Gait is intact with no imbalance; Cranial nerves I-XII are intact Head and  Face Inspection: Normocephalic and atraumatic without mass or lesion Palpation: Facial skeleton intact without bony stepoffs Salivary Glands: No mass or tenderness Facial Strength: Facial motility symmetric and full bilaterally ENT Pinna: External ear intact and fully developed External canal: Canal is patent with intact skin  Tympanic Membrane: Clear and mobile External Nose: No scar or anatomic deformity Internal Nose: Septum is deviated to the left. Bilateral nasal polyps, no purulence. Mucosal edema and erythema present.  Bilateral inferior turbinate hypertrophy.  Lips, Teeth, and gums: Mucosa and teeth intact and viable, missing teeth on exam TMJ: No pain to palpation with full mobility Oral cavity/oropharynx: No erythema or exudate, no lesions present Nasopharynx: No mass or lesion with intact mucosa Hypopharynx: Intact mucosa without pooling of secretions Larynx Glottic: Full true vocal cord mobility without lesion or mass, mildly atrophic  Supraglottic: Normal appearing epiglottis and AE folds Interarytenoid Space: Moderate pachydermia edema Subglottic Space: Patent without lesion or edema Neck Neck and Trachea: Midline trachea without mass or lesion Palpable 1-2 cm mobile round neck mass along anterior left SCM near the level of thyroid cartilage, no skin changes   Procedure:   PROCEDURE NOTE: nasal endoscopy  Preoperative diagnosis: chronic sinusitis symptoms  Postoperative diagnosis: same + bilateral nasal polyps   Procedure: Diagnostic nasal endoscopy (91478)  Surgeon: Ashok Croon, M.D.  Anesthesia: Topical lidocaine and Afrin  H&P REVIEW: The patient's history and physical were reviewed today prior to procedure. All medications were reviewed and updated as well. Complications: None Condition is stable throughout exam Indications and consent: The patient presents with symptoms of chronic sinusitis not responding to previous therapies. All the risks,  benefits, and potential complications were reviewed with the patient preoperatively and informed consent was obtained. The time out was completed with confirmation of the correct procedure.   Procedure: The patient was seated upright in the clinic. Topical lidocaine and Afrin were applied to the nasal cavity. After adequate anesthesia had occurred, the rigid nasal endoscope was passed into the nasal cavity. The nasal mucosa, turbinates, septum, and sinus drainage pathways were visualized bilaterally. This revealed no purulence or significant secretions that might be cultured. There were bilateral polyps and mucosal edema. The mucosa was intact and there was no crusting present. The scope was then slowly withdrawn and the patient tolerated the procedure well. There were no complications or blood loss.   Preoperative diagnosis: chronic dysphagia   Postoperative diagnosis:   Same + VF atrophy + GERD LPR  Procedure: Flexible fiberoptic laryngoscopy  Surgeon: Ashok Croon, MD  Anesthesia: Topical lidocaine and Afrin Complications: None Condition is stable throughout exam  Indications and consent:  The patient presents to the clinic with Indirect laryngoscopy view was incomplete. Thus it was recommended that they undergo a flexible fiberoptic laryngoscopy. All of the risks, benefits, and potential complications were reviewed with the patient preoperatively and verbal informed consent was obtained.  Procedure: The patient was seated upright in the clinic. Topical lidocaine and Afrin were applied to the nasal cavity. After adequate anesthesia had occurred, I then proceeded to pass the flexible telescope into the nasal cavity. The nasal cavity was patent without rhinorrhea or polyp. The nasopharynx was also patent without mass or lesion. The base of tongue was visualized and was normal. There were no signs of pooling of secretions in the piriform sinuses. The true vocal folds were mobile bilaterally.  There were no signs of glottic or supraglottic mucosal lesion or mass. There was moderate interarytenoid pachydermia and post cricoid edema. The telescope was then slowly withdrawn and the patient tolerated the procedure throughout.    Studies Reviewed: CT soft tissue neck 09/25/2022 EXAM: CT NECK WITH CONTRAST   TECHNIQUE: Multidetector CT imaging of the neck was performed using the standard protocol following the bolus administration of  intravenous contrast.   RADIATION DOSE REDUCTION: This exam was performed according to the departmental dose-optimization program which includes automated exposure control, adjustment of the mA and/or kV according to patient size and/or use of iterative reconstruction technique.   CONTRAST:  75mL OMNIPAQUE IOHEXOL 350 MG/ML SOLN   COMPARISON:  None Available.   FINDINGS: Pharynx and larynx: No evidence of a mass or swelling. Widely patent airway. No fluid collection or inflammatory changes in the parapharyngeal or retropharyngeal spaces.   Salivary glands: No inflammation, mass, or stone.   Thyroid: Unremarkable.   Lymph nodes: No enlarged or suspicious lymph nodes in the neck.   Vascular: Major vascular structures of the neck are patent. Mild atherosclerotic calcification at the carotid bifurcations without evidence of significant stenosis.   Limited intracranial: Unremarkable   Visualized orbits: Unremarkable.   Mastoids and visualized paranasal sinuses: Mucous retention cysts in the maxillary sinuses. Clear mastoid air cells.   Skeleton: Bulky anterior vertebral ossification throughout the cervical spine. No suspicious osseous lesion.   Upper chest: Clear lung apices.   Other: None.   IMPRESSION: No neck mass, lymphadenopathy, or acute abnormality identified.  Swallow study MBS and esophagram 02/27/23 Esophagram FINDINGS: Swallowing: Appears normal. No vestibular penetration or aspiration seen. Residual contrast remains in  vallecula.   Pharynx: Unremarkable.   Esophagus: Normal appearance.  No mucosal lesions or strictures   Esophageal motility: Within normal limits.   Hiatal Hernia: None.   Gastroesophageal reflux: None visualized even with provocative maneuvers such as cough and water siphon test.   Ingested 13 mm barium tablet: Tablet was given during modified barium swallow. See additional report for findings.   IMPRESSION: No evidence of aspiration.   Negative esophagram.  MBS 02/27/23 HPI/PMH: HPI: Pt with c/o "difficulty swallowing pills"; esophagram completed prior to MBS.  Pt denies any neurological hx of CVA, GERD or PNA related to swallowing.     Clinical Impression: Clinical Impression: Pt presents with mild pharyngoesophageal dysphagia c/b brief trace vestibular penetration (PAS 2) with small amounts of thin liquids/nectar thick liquids d/t partial superior laryngeal movement and decreased anterior movement paired with partial epiglottic inversion.  Pt able to consume larger volumes of thin without this occurrence; although vallecular/pyriform sinus retention noted d/t partial esophageal obstruction of flow.  Pt utilized repetitive swallows, liquid wash and effortful swallow to clear vallecular space and pyriform sinuses/posterior pharyngeal wall residue with good results.  Pt unable to swallow 13 mm tablet despite use of compensatory strategies, so pill was regurgitated into oral cavity and removed.  No aspiration noted with any consistency.  Recommend regular/thin liquid diet with utilization of strategies listed above.  Pt given esophageal precautions and strategies utilized during study to implement during meals.  Pt agreeable and appreciative.  If symptoms persist, may consider short-term ST for pharyngoesophageal symptoms prn.  Thank you for this consult.    Assessment/Plan: Encounter Diagnoses  Name Primary?   Neck mass Yes   Chronic sinusitis, unspecified location    Nasal polyps     Pharyngoesophageal dysphagia    Nasal congestion    Nasal obstruction    Environmental and seasonal allergies    Gastroesophageal reflux disease without esophagitis    Age-related vocal fold atrophy    Post-nasal drainage     Chronic nasal congestion and nasal obstruction, evidence of nasal polyps b/l on nasal endoscopy today with post-nasal drainage and mucosal edema, concern for chronic sinusitis. Will initiate medical management below and obtain CT sinuses  - I discussed  with the patient that his sx are likely 2/2 undiagnosed chronic environmental allergies  - CBC with diff + total IgE to complete workup for CRSwNP -I reviewed CT neck he had back in March of this year and there is evidence of mucosal thickening bilaterally maxillary sinuses concerning for chronic sinus disease - nasal saline rinses  2. Anterior neck lump - palpable round left anterior neck mass, ~ 1-2 cm in size, along left mid SCM border on exam, I reviewed CT neck he had back in March and there was no evidence of lymphadenopathy or suspicious masses back then - neck soft tissue U/S to better evaluate -Will consider repeat CT neck if needed -Differential diagnosis includes cervical lymph node versus lipoma versus other pathology -he does have a remote history of smoking  3.  Chronic dysphagia -patient with longstanding chronic symptoms of dysphagia limited to solids and pills, I reviewed results of his recent modified barium swallow and esophagram which showed mild pharyngoesophageal dysphagia per SLP report no aspiration, images of the swallow study also demonstrate a prominent osteophyte which I suspect could be one of the reasons for dysphagia symptoms, although esophagram is somewhat limited in the segment of UES, I did not appreciate prominent CP bar.  -Flexible laryngoscopy portion of the exam today with evidence of minimal secretions along hypopharynx, bilateral vocal folds were mobile, mildly atrophic, there was  evidence of moderate postcricoid edema/pachydermia -I discussed exam findings with the patient and we will plan to consider swallow therapy in the future as well as a consultation with spine surgery -At this time he does not have weight loss or any evidence of aspiration, although could benefit from swallow therapy and if candidate for osteophyte removal, surgical management of cervical osteophyte could improve his symptoms. -Will further discuss when he returns after imaging  4. GERD LPR -evidence of findings consistent with GERD LPR on flexible laryngoscopy today which could also contribute to sensation of phlegm and lump in his throat and dysphagia symptoms - Famotidine 20 mg BID  - reflux gourmet trial -diet lifestyle changes to minimize reflux    - RTC after testing   Thank you for allowing me to participate in the care of this patient. Please do not hesitate to contact me with any questions or concerns.   Ashok Croon, MD Otolaryngology [facility] Phone: [phone] Fax: [phone]    03/14/2023, 4:26 AM

## 2023-03-13 NOTE — Patient Instructions (Addendum)
-   schedule labs  - schedule neck Ultrasound and CT of your  sinuses - start antibiotic and steroids  - start Flonase and new allergy pill Clarinex  - return after testing  - start saline rinses for the nose - start Pepcid and try Reflux Gourmet   - Take Reflux Gourmet (natural supplement available on Amazon) to help with symptoms of chronic throat irritation      Lloyd Huger Med Nasal Saline Rinse   - start nasal saline rinses with NeilMed Bottle available over the counter or online to help with nasal congestion

## 2023-03-14 MED ORDER — FAMOTIDINE 20 MG PO TABS: 20.0000 mg | ORAL_TABLET | Freq: Two times a day (BID) | ORAL | 1 refills | Status: DC

## 2023-03-31 ENCOUNTER — Ambulatory Visit (HOSPITAL_COMMUNITY)
Admission: RE | Admit: 2023-03-31 | Discharge: 2023-03-31 | Disposition: A | Discharge status: Home / Self Care | Payer: Medicare Other | Source: Ambulatory Visit | Attending: Otolaryngology | Admitting: Otolaryngology

## 2023-03-31 DIAGNOSIS — J339 Nasal polyp, unspecified: Secondary | ICD-10-CM | POA: Insufficient documentation

## 2023-03-31 DIAGNOSIS — J329 Chronic sinusitis, unspecified: Secondary | ICD-10-CM | POA: Diagnosis not present

## 2023-03-31 DIAGNOSIS — Z01818 Encounter for other preprocedural examination: Secondary | ICD-10-CM | POA: Diagnosis not present

## 2023-04-03 ENCOUNTER — Ambulatory Visit (HOSPITAL_COMMUNITY)
Admission: RE | Admit: 2023-04-03 | Discharge: 2023-04-03 | Disposition: A | Discharge status: Home / Self Care | Payer: Medicare Other | Source: Ambulatory Visit | Attending: Otolaryngology | Admitting: Otolaryngology

## 2023-04-03 DIAGNOSIS — R221 Localized swelling, mass and lump, neck: Secondary | ICD-10-CM | POA: Diagnosis not present

## 2023-04-28 ENCOUNTER — Ambulatory Visit (INDEPENDENT_AMBULATORY_CARE_PROVIDER_SITE_OTHER): Payer: Medicare Other | Admitting: Otolaryngology

## 2023-04-28 ENCOUNTER — Encounter (INDEPENDENT_AMBULATORY_CARE_PROVIDER_SITE_OTHER): Payer: Self-pay | Admitting: Otolaryngology

## 2023-04-28 VITALS — BP 163/88 | HR 56

## 2023-04-28 DIAGNOSIS — J342 Deviated nasal septum: Secondary | ICD-10-CM

## 2023-04-28 DIAGNOSIS — K219 Gastro-esophageal reflux disease without esophagitis: Secondary | ICD-10-CM

## 2023-04-28 DIAGNOSIS — R1314 Dysphagia, pharyngoesophageal phase: Secondary | ICD-10-CM | POA: Diagnosis not present

## 2023-04-28 DIAGNOSIS — D170 Benign lipomatous neoplasm of skin and subcutaneous tissue of head, face and neck: Secondary | ICD-10-CM | POA: Diagnosis not present

## 2023-04-28 DIAGNOSIS — J4520 Mild intermittent asthma, uncomplicated: Secondary | ICD-10-CM

## 2023-04-28 DIAGNOSIS — J343 Hypertrophy of nasal turbinates: Secondary | ICD-10-CM

## 2023-04-28 DIAGNOSIS — J339 Nasal polyp, unspecified: Secondary | ICD-10-CM | POA: Diagnosis not present

## 2023-04-28 DIAGNOSIS — R0981 Nasal congestion: Secondary | ICD-10-CM

## 2023-04-28 DIAGNOSIS — J3089 Other allergic rhinitis: Secondary | ICD-10-CM

## 2023-04-28 DIAGNOSIS — J322 Chronic ethmoidal sinusitis: Secondary | ICD-10-CM

## 2023-04-28 DIAGNOSIS — R0982 Postnasal drip: Secondary | ICD-10-CM

## 2023-04-28 DIAGNOSIS — Z87891 Personal history of nicotine dependence: Secondary | ICD-10-CM | POA: Diagnosis not present

## 2023-04-28 MED ORDER — FAMOTIDINE 20 MG PO TABS: 20.0000 mg | ORAL_TABLET | Freq: Two times a day (BID) | ORAL | 3 refills | Status: DC

## 2023-04-28 NOTE — Patient Instructions (Addendum)
-   schedule Allergy and Pulmonary consultation  - schedule nasal procedure/surgery when ready - start steroid nasal rinses  - continue allergy pill  - schedule swallow therapy  - continue Famotidine and reflux gourmet

## 2023-04-28 NOTE — Progress Notes (Unsigned)
ENT Progress Note:  Update 04/28/23: he returns for follow-up after neck U/S and MBS//esophagram CT sinuses. Reports no changes in the size of neck lump, denies pain or skin changes. Swallowing function is stable, on regular diet. Continues to have nasal congestion, and started nasal saline rinses. On antihistamine. On Famotidine.   Initial consult 03/13/23:   Reason for Consult: neck mass and dysphagia  nasal congestion phlegm   HPI: Scott Dodson is an 72 y.o. male with hx of MI in 2011, history of diabetes, former smoking, quit in 70s, right TM reconstruction due to history of chronic ear infections performed, who is here for evaluation of longstanding chronic dysphagia symptoms and left anterior neck mass.   He is here for chronic dysphagia symptoms primarily with solids. He reports no issues with liquids. When he is eating solid foods it would get stuck in his throat. Eventually it all goes down. He has food regurgitation (undigested). Usually bread is hardest for him. Foods with particles and dry foods cause him to cough and choke.  This has been a problem for over 4-5 yrs.  He reports persistent phlegm in the back of his throat.  He also reports chronic nasal congestion and symptoms of difficulties with breathing through his nose.  He was evaluated by Dr. Ernestene Kiel, and had MBS and esophagram.  He also had CT neck.  I reviewed all imaging and summarized findings in assessment and plan.   She reports longstanding history of a lump along the left anterior neck, that did not bother him, but was noted by one of the physicians who saw him in the past, denies pain, denies significant changes in size of the lump. Of note CT soft tissue neck did not mention any lymphadenopathy or any evidence of anterior left neck mass that was performed on September 25, 2022.   Records Reviewed:  Dr Harvie Junior note from 11/04/22 Scott Dodson is a 72 y.o. year old male who presents for evaluation of a left neck nodule.  The  patient reports a non-painful lump on the left side of his neck, which he first noticed approximately a year ago. The nodule fluctuates in size, occasionally decreasing in size. He denies experiencing fevers, chills, night sweats, or weight loss.  The patient experiences dysphagia, particularly when consuming tablets and solids. He describes a sensation of food becoming lodged in his throat. He has not yet scheduled his swallow study ordered by Gastroenterology. He denies experiencing reflux. Depending on the type of food consumed, he experiences choking and coughing.  He has no history of sinus surgery. He denies significant nasal congestion and generally breathes through his nose without difficulty. He experiences facial pain and pressure during colds. His olfactory function is normal. He experiences persistent throat drainage. He has Flonase available for use. He suffers from numerous allergies.  He smoked a long time ago. He drinks 2 alcoholic beverages per night. He is retired from his main job and now works part-time. He works for [employer].   GI records Dr. Amada Jupiter 08/30/22 Clinical details of this patient's symptoms are in my July 03, 2022 office consult note. About 2 years of chronic solid food dysphagia. Scott Dodson reports having noticed a lump in the left side of his neck for the last few weeks.    EGD   The examined esophagus was normal. The scope was  withdrawn. Dilation was performed with a Maloney                            dilator with no resistance at 52 Fr.                           The stomach was normal.                           The cardia and gastric fundus were normal on                            retroflexion. (Hill grade 1)                           The examined duodenum was normal.                           The larynx could not be well-visualized. Complications:            No immediate complications.   Past Medical History:   Diagnosis Date   Allergy    Asthma    Coronary artery disease    s/p lateral MI 4 /11, s/p Promus DES OM2   Diabetes mellitus without complication (HCC)    Heart murmur    Hyperlipidemia    Hypertension    Myocardial infarct [facility]) 2011    Past Surgical History:  Procedure Laterality Date   ADENOIDECTOMY     CORONARY ARTERY BYPASS GRAFT  2011   CORONARY STENT PLACEMENT     TONSILLECTOMY     TYMPANIC MEMBRANE REPAIR Right 2004   UPPER GASTROINTESTINAL ENDOSCOPY     VASECTOMY      Family History  Problem Relation Age of Onset   Heart disease Mother    Hypertension Mother    Heart disease Father    Hypertension Father    Heart disease Brother    Heart disease Sister    Colon cancer Maternal Grandfather 31   Colon polyps Neg Hx    Esophageal cancer Neg Hx    Stomach cancer Neg Hx    Rectal cancer Neg Hx     Social History:  reports that he quit smoking about 48 years ago. His smoking use included cigarettes. He started smoking about 53 years ago. He has a 2.5 pack-year smoking history. He has never used smokeless tobacco. He reports current alcohol use of about 5.0 standard drinks of alcohol per week. He reports that he does not use drugs.  Allergies: No Known Allergies  Medications: I have reviewed the patient's current medications.  The PMH, PSH, Medications, Allergies, and SH were reviewed and updated.  ROS: Constitutional: Negative for fever, weight loss and weight gain. Cardiovascular: Negative for chest pain and dyspnea on exertion. Respiratory: Is not experiencing shortness of breath at rest. Gastrointestinal: Negative for nausea and vomiting. Neurological: Negative for headaches. Psychiatric: The patient is not nervous/anxious  Blood pressure (!) 163/88, pulse (!) 56, SpO2 98%.  PHYSICAL EXAM:  Exam: General: Well-developed, well-nourished Respiratory Respiratory effort: Equal inspiration and expiration without stridor Cardiovascular Peripheral  Vascular: Warm extremities with equal color/perfusion Eyes: No nystagmus with equal extraocular motion bilaterally Neuro/Psych/Balance: Patient oriented to person, place, and time; Appropriate mood and affect; Gait is  intact with no imbalance; Cranial nerves I-XII are intact Head and Face Inspection: Normocephalic and atraumatic without mass or lesion Palpation: Facial skeleton intact without bony stepoffs Salivary Glands: No mass or tenderness Facial Strength: Facial motility symmetric and full bilaterally ENT Pinna: External ear intact and fully developed External canal: Canal is patent with intact skin Tympanic Membrane: Clear and mobile External Nose: No scar or anatomic deformity Internal Nose: Septum is deviated to the left. Bilateral nasal polyps, no purulence. Mucosal edema and erythema present.  Bilateral inferior turbinate hypertrophy.  Lips, Teeth, and gums: Mucosa and teeth intact and viable, missing teeth on exam TMJ: No pain to palpation with full mobility Oral cavity/oropharynx: No erythema or exudate, no lesions present Nasopharynx: No mass or lesion with intact mucosa Hypopharynx: Intact mucosa without pooling of secretions Larynx Glottic: Full true vocal cord mobility without lesion or mass, mildly atrophic  Supraglottic: Normal appearing epiglottis and AE folds Interarytenoid Space: Moderate pachydermia edema Subglottic Space: Patent without lesion or edema Neck Neck and Trachea: Midline trachea without mass or lesion Palpable 1-2 cm mobile round neck mass along anterior left SCM near the level of thyroid cartilage, no skin changes   Procedure:   PROCEDURE NOTE: nasal endoscopy  Preoperative diagnosis: chronic sinusitis symptoms/nasal polyposis   Postoperative diagnosis: same + bilateral nasal polyps   Procedure: Diagnostic nasal endoscopy (40981)  Surgeon: Ashok Croon, M.D.  Anesthesia: Topical lidocaine and Afrin  H&P REVIEW: The patient's history  and physical were reviewed today prior to procedure. All medications were reviewed and updated as well. Complications: None Condition is stable throughout exam Indications and consent: The patient presents with symptoms of chronic sinusitis not responding to previous therapies. All the risks, benefits, and potential complications were reviewed with the patient preoperatively and informed consent was obtained. The time out was completed with confirmation of the correct procedure.   Procedure: The patient was seated upright in the clinic. Topical lidocaine and Afrin were applied to the nasal cavity. After adequate anesthesia had occurred, the rigid nasal endoscope was passed into the nasal cavity. The nasal mucosa, turbinates, septum, and sinus drainage pathways were visualized bilaterally. This revealed no purulence or significant secretions that might be cultured. There were bilateral polyps and mucosal edema. The mucosa was intact and there was no crusting present. The scope was then slowly withdrawn and the patient tolerated the procedure well. There were no complications or blood loss.  Studies Reviewed: CT soft tissue neck 09/25/2022 EXAM: CT NECK WITH CONTRAST   TECHNIQUE: Multidetector CT imaging of the neck was performed using the standard protocol following the bolus administration of intravenous contrast.   RADIATION DOSE REDUCTION: This exam was performed according to the departmental dose-optimization program which includes automated exposure control, adjustment of the mA and/or kV according to patient size and/or use of iterative reconstruction technique.   CONTRAST:  75mL OMNIPAQUE IOHEXOL 350 MG/ML SOLN   COMPARISON:  None Available.   FINDINGS: Pharynx and larynx: No evidence of a mass or swelling. Widely patent airway. No fluid collection or inflammatory changes in the parapharyngeal or retropharyngeal spaces.   Salivary glands: No inflammation, mass, or stone.    Thyroid: Unremarkable.   Lymph nodes: No enlarged or suspicious lymph nodes in the neck.   Vascular: Major vascular structures of the neck are patent. Mild atherosclerotic calcification at the carotid bifurcations without evidence of significant stenosis.   Limited intracranial: Unremarkable   Visualized orbits: Unremarkable.   Mastoids and visualized paranasal sinuses: Mucous retention  cysts in the maxillary sinuses. Clear mastoid air cells.   Skeleton: Bulky anterior vertebral ossification throughout the cervical spine. No suspicious osseous lesion.   Upper chest: Clear lung apices.   Other: None.   IMPRESSION: No neck mass, lymphadenopathy, or acute abnormality identified.  Swallow study MBS and esophagram 02/27/23 Esophagram FINDINGS: Swallowing: Appears normal. No vestibular penetration or aspiration seen. Residual contrast remains in vallecula.   Pharynx: Unremarkable.   Esophagus: Normal appearance.  No mucosal lesions or strictures   Esophageal motility: Within normal limits.   Hiatal Hernia: None.   Gastroesophageal reflux: None visualized even with provocative maneuvers such as cough and water siphon test.   Ingested 13 mm barium tablet: Tablet was given during modified barium swallow. See additional report for findings.   IMPRESSION: No evidence of aspiration.   Negative esophagram.  MBS 02/27/23 HPI/PMH: HPI: Pt with c/o "difficulty swallowing pills"; esophagram completed prior to MBS.  Pt denies any neurological hx of CVA, GERD or PNA related to swallowing.     Clinical Impression: Clinical Impression: Pt presents with mild pharyngoesophageal dysphagia c/b brief trace vestibular penetration (PAS 2) with small amounts of thin liquids/nectar thick liquids d/t partial superior laryngeal movement and decreased anterior movement paired with partial epiglottic inversion.  Pt able to consume larger volumes of thin without this occurrence; although  vallecular/pyriform sinus retention noted d/t partial esophageal obstruction of flow.  Pt utilized repetitive swallows, liquid wash and effortful swallow to clear vallecular space and pyriform sinuses/posterior pharyngeal wall residue with good results.  Pt unable to swallow 13 mm tablet despite use of compensatory strategies, so pill was regurgitated into oral cavity and removed.  No aspiration noted with any consistency.  Recommend regular/thin liquid diet with utilization of strategies listed above.  Pt given esophageal precautions and strategies utilized during study to implement during meals.  Pt agreeable and appreciative.  If symptoms persist, may consider short-term ST for pharyngoesophageal symptoms prn.  Thank you for this consult.      Assessment/Plan: Encounter Diagnoses  Name Primary?   Pharyngoesophageal dysphagia Yes   Chronic anterior ethmoidal sinusitis    Nasal polyps    Post-nasal drainage    Environmental and seasonal allergies    Chronic nasal congestion    Nasal septal deviation    Hypertrophy of both inferior nasal turbinates    Mild intermittent asthma without complication      Chronic nasal congestion and nasal obstruction, evidence of nasal polyps b/l on nasal endoscopy today with post-nasal drainage and mucosal edema, concern for chronic sinusitis. Will initiate medical management below and obtain CT sinuses  - I discussed with the patient that his sx are likely 2/2 undiagnosed chronic environmental allergies  - CBC with diff + total IgE to complete workup for CRSwNP -I reviewed CT neck he had back in March of this year and there is evidence of mucosal thickening bilaterally maxillary sinuses concerning for chronic sinus disease - nasal saline rinses  2. Anterior neck lump - palpable round left anterior neck mass, ~ 1-2 cm in size, along left mid SCM border on exam, I reviewed CT neck he had back in March and there was no evidence of lymphadenopathy or  suspicious masses back then - neck soft tissue U/S to better evaluate -Will consider repeat CT neck if needed -Differential diagnosis includes cervical lymph node versus lipoma versus other pathology -he does have a remote history of smoking  3.  Chronic dysphagia -patient with longstanding chronic symptoms of dysphagia  limited to solids and pills, I reviewed results of his recent modified barium swallow and esophagram which showed mild pharyngoesophageal dysphagia per SLP report no aspiration, images of the swallow study also demonstrate a prominent osteophyte which I suspect could be one of the reasons for dysphagia symptoms, although esophagram is somewhat limited in the segment of UES, I did not appreciate prominent CP bar.  -Flexible laryngoscopy portion of the exam today with evidence of minimal secretions along hypopharynx, bilateral vocal folds were mobile, mildly atrophic, there was evidence of moderate postcricoid edema/pachydermia -I discussed exam findings with the patient and we will plan to consider swallow therapy in the future as well as a consultation with spine surgery -At this time he does not have weight loss or any evidence of aspiration, although could benefit from swallow therapy and if candidate for osteophyte removal, surgical management of cervical osteophyte could improve his symptoms. -Will further discuss when he returns after imaging  4. GERD LPR -evidence of findings consistent with GERD LPR on flexible laryngoscopy today which could also contribute to sensation of phlegm and lump in his throat and dysphagia symptoms - Famotidine 20 mg BID  - reflux gourmet trial -diet lifestyle changes to minimize reflux    - RTC after testing   Update 04/28/23 CT sinuses with evidence of mucus retention cysts in maxillary sinuses, otherwise no evidence of CRS, also had evidence of septal deviation and ITH and what appears like nasal polyps along b/l nasal passages, which is c/w  with his nasal endoscopy.   Chronic nasal congestion/obstruction and evidence of nasal polyps and NSD/ITH on CT sinuses and repeat nasal endoscopy today  - CT findings c/w NSD/ITH and b/l nasal polyps - will use for image guidance if he elects to proceed with surgery  - he did not complete labs (CBC/diff and total IgE) - we discussed management of chronic nasal congestion and risks and benefits of surgical removal of nasal polyps/septoplasty/ITR and he is interested - will refer to Allergy for Dupixent consultation to prevent polyp recurrence  - start nasal saline/steroid rinses - Rx sent for mometasone (will stop Flonase) - continue Clarinex 5 mg daily   Chronic dysphagia - on regular/thin liquid diet right now - patient with longstanding chronic symptoms of dysphagia limited to solids and pills, I reviewed results of his recent modified barium swallow and esophagram which showed mild pharyngoesophageal dysphagia per SLP report no aspiration, images of the swallow study also demonstrate a prominent osteophyte which I suspect could be one of the reasons for dysphagia symptoms, although esophagram is somewhat limited in the segment of UES, I did not appreciate prominent CP bar.  -Flexible laryngoscopy during the initial consultation with evidence of minimal secretions along hypopharynx, bilateral vocal folds were mobile, mildly atrophic, there was evidence of moderate postcricoid edema/pachydermia - will consider consulting Spine Surgery if dysphagia sx worsen (2/2 evidence of cervical osteophyte - will proceed with swallow therapy referral   3. Neck lipoma  - neck U/S with evidence of 2.6 cm subcutaneous nodule (same density as surrounding fat) c/w likely lipoma - he denies any recent changes in size, pain or numbness/sx from the mass, has had it for a long time - will observe and repeat imaging if grows over time or if he develops sx - had flexible scope without evidence of mass/tumor during his  last office visit   4. Hx of environmental allergies  - schedule Allergy consultation and medical management as above  5. Hx of  smoking, quit several years ago, reports an episode of hypoxia in the setting of URI, requiring admission to the hospital for a few days   - Pulmonary consultation to rule out lung disease such as reactive airway disease or COPD   6. Chronic GERD LPR - continue Famotidine 20 mg daily (refill sent) and reflux gourmet   Ashok Croon, MD Otolaryngology [facility] Phone: [phone] Fax: [phone]    04/29/2023, 3:26 PM

## 2023-04-29 ENCOUNTER — Telehealth: Payer: Self-pay | Admitting: *Deleted

## 2023-04-29 NOTE — Telephone Encounter (Signed)
Pre-operative Risk Assessment    Patient Name: Scott Dodson  DOB: 22-Mar-1951 MRN: [id number]    DATE OF LAST VISIT: NONE DATE OF NEXT VISIT: NONE; PT NEEDS NEW PT APPT.   Request for Surgical Clearance    Procedure:   NASAL ENDOSCOPY, B/L NASAL POLYP REMOVAL, SEPTOPLASTY  Date of Surgery:  Clearance TBD                                 Surgeon:  DR. Ashok Croon Surgeon's Group or Practice Name:  [facility] Phone number:  [phone] Fax number:  [phone]   Type of Clearance Requested:   - Medical ; ASA    Type of Anesthesia:   TOPICAL LIDOCAINE AND AFRIN   Additional requests/questions:    Elpidio Anis   04/29/2023, 6:05 PM

## 2023-04-30 NOTE — Telephone Encounter (Signed)
Name: Scott Dodson  DOB: 1950/12/21  MRN: [id number]  Primary Cardiologist: None  Chart reviewed as part of pre-operative protocol coverage. Because of [name] past medical history and time since last visit, he will require a follow-up in-office visit in order to better assess preoperative cardiovascular risk.  Patient has not been seen since 2015 and will need to reestablish care prior to surgery.  Pre-op covering staff: - Please schedule appointment and call patient to inform them. If patient already had an upcoming appointment within acceptable timeframe, please add "pre-op clearance" to the appointment notes so provider is aware. - Please contact requesting surgeon's office via preferred method (i.e, phone, fax) to inform them of need for appointment prior to surgery.   Napoleon Form, Leodis Rains, NP  04/30/2023, 8:19 AM

## 2023-04-30 NOTE — Telephone Encounter (Signed)
Preop clearance in office visit scheduled

## 2023-05-18 NOTE — Progress Notes (Unsigned)
 ERROR - Duplicate note

## 2023-05-19 ENCOUNTER — Encounter: Payer: Self-pay | Admitting: Adult Health

## 2023-05-19 ENCOUNTER — Ambulatory Visit: Payer: Medicare Other | Attending: Adult Health | Admitting: Adult Health

## 2023-05-19 VITALS — BP 132/70 | HR 51 | Ht 69.0 in | Wt 196.0 lb

## 2023-05-19 DIAGNOSIS — Z01810 Encounter for preprocedural cardiovascular examination: Secondary | ICD-10-CM | POA: Diagnosis not present

## 2023-05-19 DIAGNOSIS — E7800 Pure hypercholesterolemia, unspecified: Secondary | ICD-10-CM

## 2023-05-19 DIAGNOSIS — I10 Essential (primary) hypertension: Secondary | ICD-10-CM | POA: Diagnosis not present

## 2023-05-19 DIAGNOSIS — I252 Old myocardial infarction: Secondary | ICD-10-CM

## 2023-05-19 DIAGNOSIS — E118 Type 2 diabetes mellitus with unspecified complications: Secondary | ICD-10-CM | POA: Diagnosis not present

## 2023-05-19 NOTE — Progress Notes (Signed)
Cardiology Office Note:  .   Date:  05/19/2023  ID:  Scott Dodson, DOB 09/11/1950, MRN [id number] PCP: Scott Quint, MD  [facility] Providers Cardiologist: Scott Dodson (not seen since 2015) }   History of Present Illness: .   Scott Dodson is a very pleasant 72 y.o. male with history of CAD, STEMI on 10/02/2009 with totally occluded second obtuse marginal branch of the circumflex; status post problems drug-eluting stent to the ostium of the second obtuse marginal branch of the circumflex.  HTN, hyperlipidemia and DM.  He has not been seen by cardiology since January 05, 2014.  He comes today for preoperative cardiac evaluation to have nasal endoscopy with bilateral nasal polyp removal and septoplasty by Dr. Irene Dodson on date to be determined.  They are asking for recommendations holding aspirin.  Since being seen last the patient was hospitalized for asthmatic bronchitis due to hypoxia in December 2023.  He has also been having some issues with esophageal dysphagia and is being followed by GI and ENT.  On their evaluation they did notice some polyps in his nasal cavities along with a deviated septum and he is here to be cleared to move forward with surgical repair and removal of polyps.  He is followed by his PCP for labs, management of hypertension, hyperlipidemia, and diabetes.  Since being seen last he has not had any cardiac complaints.  He continues to work, walk long distances at his job, climbs stairs, can lift heavy objects, working in his yard, used to play golf fairly regularly but is now going to "[employer]" to continue to play.  He denies any palpitations, dizziness, near-syncope, breathing status is changed somewhat over time due to his asthma.  He denies any significant shortness of breath associated with activity however.  He has been medically compliant.  He has had to stop [place] as it is too expensive for him to pay for currently.  He still has some which she is going to  finish up and he will be talked to his primary care provider about this.  ROS: As above otherwise negative  Studies Reviewed: Marland Kitchen   EKG Interpretation Date/Time:  Monday May 19 2023 08:50:43 EST Ventricular Rate:  51 PR Interval:  178 QRS Duration:  80 QT Interval:  462 QTC Calculation: 425 R Axis:   12  Text Interpretation: Sinus bradycardia Anteroseptal infarct , age undetermined When compared with ECG of 28-Jun-2022 12:23, PREVIOUS ECG IS PRESENT Confirmed by Joni Reining [phone]) on 05/19/2023 9:23:10 AM     Physical Exam:   VS:  BP 132/70 (BP Location: Left Arm, Patient Position: Sitting, Cuff Size: Normal)   Pulse (!) 51   Ht 5\' 9"  (1.753 m)   Wt 196 lb (88.9 kg)   BMI 28.94 kg/m    Wt Readings from Last 3 Encounters:  05/19/23 196 lb (88.9 kg)  03/13/23 198 lb (89.8 kg)  12/25/22 197 lb (89.4 kg)    GEN: Well nourished, well developed in no acute distress NECK: No JVD; No carotid bruits CARDIAC: RRR, bradycardic, no murmurs, rubs, gallops RESPIRATORY:  Clear to auscultation bibasilar Rales are noted without wheezing or rhonchi ABDOMEN: Soft, non-tender, non-distended EXTREMITIES:  No edema; No deformity DP PT 2+ bilaterally  ASSESSMENT AND PLAN: .    Preoperative cardiac evaluation:   According to the Revised Cardiac Risk Index (RCRI), his Perioperative Risk of Major Cardiac Event is (%): 0.4  His Functional Capacity in METs is: 7.66 according to the [facility]  Activity Status Index (DASI).   Per office protocol, if patient is without any new symptoms or concerns at the time of their virtual visit, he/she may hold ASA for 7 days prior to procedure. Please resume ASA as soon as possible postprocedure, at the discretion of the surgeon.    However due to to history of CAD hypertension and diabetes, I am going to repeat his echocardiogram for any changes in LV function prior to clearing for surgery.  If echocardiogram is normal we will release him to move forward with  his planned procedure.    2.  Coronary artery disease: Status post STEMI in 2011 with DES to the ostium of the obtuse marginal of the circumflex.  3.  Hypercholesterolemia: Followed by primary care provider.  Goal of LDL less than 70.  4.  Hypertension: Currently well-controlled on medication regimen.  Primary care provider is managing labs.  [name] would like to be reestablished with Dr. Clifton Dodson as he was his primary cardiologist in the past.  We will make sure he has a follow-up appointment with him in a year to 18 months to continue relationship.  Signed, Scott Dodson, ANP, AACC

## 2023-05-19 NOTE — Patient Instructions (Signed)
Medication Instructions:  No Changes *If you need a refill on your cardiac medications before your next appointment, please call your pharmacy*   Lab Work: No Labs If you have labs (blood work) drawn today and your tests are completely normal, you will receive your results only by: MyChart Message (if you have MyChart) OR A paper copy in the mail If you have any lab test that is abnormal or we need to change your treatment, we will call you to review the results.   Testing/Procedures: [address], [address]. Your physician has requested that you have an echocardiogram. Echocardiography is a painless test that uses sound waves to create images of your heart. It provides your doctor with information about the size and shape of your heart and how well your heart's chambers and valves are working. This procedure takes approximately one hour. There are no restrictions for this procedure. Please do NOT wear cologne, perfume, aftershave, or lotions (deodorant is allowed). Please arrive 15 minutes prior to your appointment time.  Please note: We ask at that you not bring children with you during ultrasound (echo/ vascular) testing. Due to room size and safety concerns, children are not allowed in the ultrasound rooms during exams. Our front office staff cannot provide observation of children in our lobby area while testing is being conducted. An adult accompanying a patient to their appointment will only be allowed in the ultrasound room at the discretion of the ultrasound technician under special circumstances. We apologize for any inconvenience.    Follow-Up: At [facility], you and your health needs are our priority.  As part of our continuing mission to provide you with exceptional heart care, we have created designated Provider Care Teams.  These Care Teams include your primary Cardiologist (physician) and Advanced Practice Providers (APPs -  Physician Assistants and Nurse  Practitioners) who all work together to provide you with the care you need, when you need it.  We recommend signing up for the patient portal called "MyChart".  Sign up information is provided on this After Visit Summary.  MyChart is used to connect with patients for Virtual Visits (Telemedicine).  Patients are able to view lab/test results, encounter notes, upcoming appointments, etc.  Non-urgent messages can be sent to your provider as well.   To learn more about what you can do with MyChart, go to [email or web address].    Your next appointment:   18 month(s)  Provider:   Verne Carrow, MD

## 2023-05-26 ENCOUNTER — Encounter: Payer: Self-pay | Admitting: Emergency Medicine

## 2023-05-26 ENCOUNTER — Ambulatory Visit (INDEPENDENT_AMBULATORY_CARE_PROVIDER_SITE_OTHER): Payer: Medicare Other | Admitting: Emergency Medicine

## 2023-05-26 VITALS — BP 124/70 | HR 55 | Temp 98.0°F | Ht 69.0 in | Wt 195.0 lb

## 2023-05-26 DIAGNOSIS — I255 Ischemic cardiomyopathy: Secondary | ICD-10-CM

## 2023-05-26 DIAGNOSIS — E1159 Type 2 diabetes mellitus with other circulatory complications: Secondary | ICD-10-CM

## 2023-05-26 DIAGNOSIS — E1169 Type 2 diabetes mellitus with other specified complication: Secondary | ICD-10-CM

## 2023-05-26 DIAGNOSIS — I152 Hypertension secondary to endocrine disorders: Secondary | ICD-10-CM | POA: Diagnosis not present

## 2023-05-26 DIAGNOSIS — E785 Hyperlipidemia, unspecified: Secondary | ICD-10-CM | POA: Diagnosis not present

## 2023-05-26 DIAGNOSIS — Z7984 Long term (current) use of oral hypoglycemic drugs: Secondary | ICD-10-CM

## 2023-05-26 LAB — COMPREHENSIVE METABOLIC PANEL
ALT: 12 U/L (ref 0–53)
AST: 15 U/L (ref 0–37)
Albumin: 4.4 g/dL (ref 3.5–5.2)
Alkaline Phosphatase: 59 U/L (ref 39–117)
BUN: 17 mg/dL (ref 6–23)
CO2: 26 meq/L (ref 19–32)
Calcium: 9.3 mg/dL (ref 8.4–10.5)
Chloride: 106 meq/L (ref 96–112)
Creatinine, Ser: 1.04 mg/dL (ref 0.40–1.50)
GFR: 72.84 mL/min (ref >60.00)
Glucose, Bld: 133 mg/dL — ABNORMAL HIGH (ref 70–99)
Potassium: 4.2 meq/L (ref 3.5–5.1)
Sodium: 144 meq/L (ref 135–145)
Total Bilirubin: 0.4 mg/dL (ref 0.2–1.2)
Total Protein: 6.5 g/dL (ref 6.0–8.3)

## 2023-05-26 LAB — CBC WITH DIFFERENTIAL/PLATELET
Basophils Absolute: 0 10*3/uL (ref 0.0–0.1)
Basophils Relative: 0.8 % (ref 0.0–3.0)
Eosinophils Absolute: 0.2 10*3/uL (ref 0.0–0.7)
Eosinophils Relative: 4.7 % (ref 0.0–5.0)
HCT: 48.7 % (ref 39.0–52.0)
Hemoglobin: 16.2 g/dL (ref 13.0–17.0)
Lymphocytes Relative: 26.9 % (ref 12.0–46.0)
Lymphs Abs: 1.3 10*3/uL (ref 0.7–4.0)
MCHC: 33.2 g/dL (ref 30.0–36.0)
MCV: 88.2 fL (ref 78.0–100.0)
Monocytes Absolute: 0.5 10*3/uL (ref 0.1–1.0)
Monocytes Relative: 9.8 % (ref 3.0–12.0)
Neutro Abs: 2.8 10*3/uL (ref 1.4–7.7)
Neutrophils Relative %: 57.8 % (ref 43.0–77.0)
Platelets: 150 10*3/uL (ref 150.0–400.0)
RBC: 5.52 Mil/uL (ref 4.22–5.81)
RDW: 14.2 % (ref 11.5–15.5)
WBC: 4.8 10*3/uL (ref 4.0–10.5)

## 2023-05-26 LAB — MICROALBUMIN / CREATININE URINE RATIO
Creatinine,U: 79.9 mg/dL
Microalb Creat Ratio: 1.3 mg/g (ref 0.0–30.0)
Microalb, Ur: 1.1 mg/dL (ref 0.0–1.9)

## 2023-05-26 LAB — LIPID PANEL
Cholesterol: 133 mg/dL (ref 0–200)
HDL: 51 mg/dL (ref >39.00)
LDL Cholesterol: 61 mg/dL (ref 0–99)
NonHDL: 82.43
Total CHOL/HDL Ratio: 3
Triglycerides: 105 mg/dL (ref 0.0–149.0)
VLDL: 21 mg/dL (ref 0.0–40.0)

## 2023-05-26 LAB — HEMOGLOBIN A1C: Hgb A1c MFr Bld: 6.6 % — ABNORMAL HIGH (ref 4.6–6.5)

## 2023-05-26 NOTE — Assessment & Plan Note (Signed)
Well-controlled hypertension. Continue amlodipine 5 mg, lisinopril 40 mg, and carvedilol 25 mg twice a day Well-controlled diabetes with hemoglobin A1c of 6.3 Continue metformin 500 mg twice a day along with glipizide 5 mg with breakfast and daily Farxiga 10 mg Cardiovascular risks associated with hypertension and diabetes discussed Diet and nutrition discussed Follow-up in 6 months

## 2023-05-26 NOTE — Assessment & Plan Note (Signed)
Stable.  No recent anginal episodes. Continues daily baby aspirin along with carvedilol 25 mg twice a day

## 2023-05-26 NOTE — Patient Instructions (Signed)

## 2023-05-26 NOTE — Assessment & Plan Note (Signed)
Stable chronic condition. Continue rosuvastatin 20 mg daily. Diet and nutrition discussed.

## 2023-05-26 NOTE — Progress Notes (Signed)
Lab Results  Component Value Date   HGBA1C 6.3 (A) 11/18/2022   Wt Readings from Last 3 Encounters:  05/19/23 196 lb (88.9 kg)  03/13/23 198 lb (89.8 kg)  12/25/22 197 lb (89.4 kg)   BP Readings from Last 3 Encounters:  05/19/23 132/70  04/28/23 (!) 163/88  03/13/23 (!) 150/79   Scott Dodson 72 y.o.   Chief Complaint  Patient presents with   Follow-up    6 month f/u.. patient states Marcelline Deist is getting too expensive.    HISTORY OF PRESENT ILLNESS: This is a 72 y.o. male here for 21-month follow-up of chronic medical conditions including diabetes and hypertension Overall doing well. Scheduled for ENT surgery as soon as he gets cardiology clearance  has no complaints or medical concerns today. Most recent cardiologist office visit assessment and plan as follows  ASSESSMENT AND PLAN: .     Preoperative cardiac evaluation:   According to the Revised Cardiac Risk Index (RCRI), his Perioperative Risk of Major Cardiac Event is (%): 0.4   His Functional Capacity in METs is: 7.66 according to the Duke Activity Status Index (DASI).    Per office protocol, if patient is without any new symptoms or concerns at the time of their virtual visit, he/she may hold ASA for 7 days prior to procedure. Please resume ASA as soon as possible postprocedure, at the discretion of the surgeon.     However due to to history of CAD hypertension and diabetes, I am going to repeat his echocardiogram for any changes in LV function prior to clearing for surgery.  If echocardiogram is normal we will release him to move forward with his planned procedure.    2.  Coronary artery disease: Status post STEMI in 2011 with DES to the ostium of the obtuse marginal of the circumflex.   3.  Hypercholesterolemia: Followed by primary care provider.  Goal of LDL less than 70.   4.  Hypertension: Currently well-controlled on medication regimen.  Primary care provider is managing labs.    HPI   Prior to Admission  medications   Medication Sig Start Date End Date Taking? Authorizing Provider  albuterol (VENTOLIN HFA) 108 (90 Base) MCG/ACT inhaler Inhale 2 puffs into the lungs every 6 (six) hours as needed for wheezing or shortness of breath. 06/30/22  Yes Burnadette Pop, MD  amLODipine (NORVASC) 5 MG tablet TAKE 1 TABLET BY MOUTH DAILY 02/22/23  Yes Talea Manges, Eilleen Kempf, MD  Ascorbic Acid (VITAMIN C) 1000 MG tablet Take 2,000 mg by mouth daily.   Yes [provider]  aspirin 81 MG tablet Take 81 mg by mouth daily.   Yes [provider]  carvedilol (COREG) 25 MG tablet TAKE 1 TABLET BY MOUTH TWICE  DAILY WITH MEALS 01/17/23  Yes Viki Carrera, Eilleen Kempf, MD  Coenzyme Q10 (CO Q 10 PO) Take 1 tablet by mouth daily.   Yes [provider]  desloratadine (CLARINEX) 5 MG tablet Take 1 tablet (5 mg total) by mouth daily. 03/13/23  Yes Ashok Croon, MD  famotidine (PEPCID) 20 MG tablet Take 1 tablet (20 mg total) by mouth 2 (two) times daily. 04/28/23  Yes Ashok Croon, MD  FARXIGA 10 MG TABS tablet TAKE 1 TABLET BY MOUTH DAILY  BEFORE BREAKFAST 01/04/23  Yes Geisha Abernathy, Eilleen Kempf, MD  fluticasone [facility]) 50 MCG/ACT nasal spray Place 2 sprays into both nostrils daily. 03/13/23  Yes Soldatova, Eusebio Friendly, MD  glipiZIDE (GLUCOTROL) 5 MG tablet TAKE 1 TABLET BY MOUTH DAILY  WITH  BREAKFAST 01/17/23  Yes Georgina Quint, MD  lisinopril (ZESTRIL) 40 MG tablet TAKE 1 TABLET BY MOUTH DAILY 01/17/23  Yes Georgina Quint, MD  metFORMIN (GLUCOPHAGE) 500 MG tablet TAKE 1 TABLET BY MOUTH TWICE  DAILY WITH MEALS 01/17/23  Yes Catlin Doria, Eilleen Kempf, MD  methylPREDNISolone (MEDROL DOSEPAK) 4 MG TBPK tablet Take with signs of chronic sinusitis and take as directed 03/13/23  Yes Soldatova, Eusebio Friendly, MD  rosuvastatin (CRESTOR) 20 MG tablet TAKE 1 TABLET BY MOUTH DAILY 01/17/23  Yes Maryalyce Sanjuan, Eilleen Kempf, MD  amoxicillin-clavulanate (AUGMENTIN) 875-125 MG tablet Take 1 tablet by mouth 2 (two) times  daily. Patient not taking: Reported on 05/26/2023 03/13/23   Ashok Croon, MD  cetirizine (ZYRTEC ALLERGY) 10 MG tablet Take 1 tablet (10 mg total) by mouth at bedtime. 06/19/22 03/13/23  Theadora Rama Scales, PA-C  Ipratropium-Albuterol (COMBIVENT RESPIMAT) 20-100 MCG/ACT AERS respimat Inhale 1 puff into the lungs every 6 (six) hours. 06/19/22 03/13/23  Theadora Rama Scales, PA-C    No Known Allergies  Patient Active Problem List   Diagnosis Date Noted   HLD (hyperlipidemia) 06/28/2022   HTN (hypertension) 06/28/2022   Ischemic cardiomyopathy 05/21/2022   Esophageal dysphagia 05/21/2022   Chronic bilateral low back pain without sciatica 05/21/2022   Chronic left hip pain 05/21/2022   Age-related nuclear cataract of both eyes 02/05/2021   Vitreomacular adhesion of left eye 12/13/2019   Severe nonproliferative diabetic retinopathy of right eye without macular edema associated with type 2 diabetes mellitus (HCC) 12/13/2019   Severe nonproliferative diabetic retinopathy of left eye, with macular edema, associated with type 2 diabetes mellitus (HCC) 12/13/2019   Vitreomacular adhesion of right eye 12/13/2019   History of diabetes mellitus 02/09/2018   CARDIOMYOPATHY, ISCHEMIC 05/18/2010   Coronary atherosclerosis 10/11/2009   Dyslipidemia associated with type 2 diabetes mellitus (HCC) 10/09/2009   HYPERLIPIDEMIA 10/09/2009   Hypertension associated with diabetes (HCC) 10/09/2009   MYOCARDIAL INFARCTION, HX OF 10/09/2009    Past Medical History:  Diagnosis Date   Allergy    Asthma    Coronary artery disease    s/p lateral MI 4 /11, s/p Promus DES OM2   Diabetes mellitus without complication (HCC)    Heart murmur    Hyperlipidemia    Hypertension    Myocardial infarct (HCC) 2011    Past Surgical History:  Procedure Laterality Date   ADENOIDECTOMY     CORONARY ARTERY BYPASS GRAFT  2011   CORONARY STENT PLACEMENT     TONSILLECTOMY     TYMPANIC MEMBRANE REPAIR Right 2004    UPPER GASTROINTESTINAL ENDOSCOPY     VASECTOMY      Social History   Socioeconomic History   Marital status: Married    Spouse name: Not on file   Number of children: 3   Years of education: Not on file   Highest education level: Not on file  Occupational History   Occupation: Company secretary   Occupation: retired  Tobacco Use   Smoking status: Former    Current packs/day: 0.00    Average packs/day: 0.5 packs/day for 5.0 years (2.5 ttl pk-yrs)    Types: Cigarettes    Start date: 10/13/1969    Quit date: 10/14/1974    Years since quitting: 48.6   Smokeless tobacco: Never  Vaping Use   Vaping status: Never Used  Substance and Sexual Activity   Alcohol use: Yes    Alcohol/week: 5.0 standard drinks of alcohol    Types: 5 Standard drinks  or equivalent per week   Drug use: No   Sexual activity: Yes  Other Topics Concern   Not on file  Social History Narrative   Occupation- Writer    Former -tobacco -stopped 1979   Married ,3 children     Alcohol use - yes , social    Drug use - no   Regular exercise- yes    Social Determinants of Health   Financial Resource Strain: Low Risk  (12/25/2022)   Overall Financial Resource Strain (CARDIA)    Difficulty of Paying Living Expenses: Not hard at all  Food Insecurity: No Food Insecurity (12/25/2022)   Hunger Vital Sign    Worried About Running Out of Food in the Last Year: Never true    Ran Out of Food in the Last Year: Never true  Transportation Needs: No Transportation Needs (12/25/2022)   PRAPARE - Administrator, Civil Service (Medical): No    Lack of Transportation (Non-Medical): No  Physical Activity: Sufficiently Active (12/25/2022)   Exercise Vital Sign    Days of Exercise per Week: 3 days    Minutes of Exercise per Session: 100 min  Stress: No Stress Concern Present (12/25/2022)   [employer] of Occupational Health - Occupational Stress Questionnaire    Feeling of Stress :  Not at all  Social Connections: Moderately Integrated (12/25/2022)   Social Connection and Isolation Panel [NHANES]    Frequency of Communication with Friends and Family: Once a week    Frequency of Social Gatherings with Friends and Family: Once a week    Attends Religious Services: More than 4 times per year    Active Member of [employer] or Organizations: Yes    Attends Banker Meetings: 1 to 4 times per year    Marital Status: Married  Catering manager Violence: Not At Risk (12/25/2022)   Humiliation, Afraid, Rape, and Kick questionnaire    Fear of Current or Ex-Partner: No    Emotionally Abused: No    Physically Abused: No    Sexually Abused: No    Family History  Problem Relation Age of Onset   Heart disease Mother    Hypertension Mother    Heart disease Father    Hypertension Father    Heart disease Brother    Heart disease Sister    Colon cancer Maternal Grandfather 16   Colon polyps Neg Hx    Esophageal cancer Neg Hx    Stomach cancer Neg Hx    Rectal cancer Neg Hx      Review of Systems  Constitutional: Negative.  Negative for chills and fever.  HENT: Negative.  Negative for congestion and sore throat.   Respiratory: Negative.  Negative for cough and shortness of breath.   Cardiovascular: Negative.  Negative for chest pain and palpitations.  Gastrointestinal:  Negative for abdominal pain, diarrhea, nausea and vomiting.  Skin: Negative.  Negative for rash.  Neurological: Negative.  Negative for dizziness and headaches.  All other systems reviewed and are negative.   Today's Vitals   05/26/23 0804  BP: 124/70  Pulse: (!) 55  Temp: 98 F (36.7 C)  TempSrc: Oral  SpO2: 94%  Weight: 195 lb (88.5 kg)  Height: 5\' 9"  (1.753 m)   Body mass index is 28.8 kg/m.   Physical Exam Vitals reviewed.  Constitutional:      Appearance: Normal appearance.  HENT:     Head: Normocephalic.     Mouth/Throat:  Mouth: Mucous membranes are moist.     Pharynx:  Oropharynx is clear.  Eyes:     Extraocular Movements: Extraocular movements intact.     Conjunctiva/sclera: Conjunctivae normal.     Pupils: Pupils are equal, round, and reactive to light.  Cardiovascular:     Rate and Rhythm: Normal rate and regular rhythm.     Pulses: Normal pulses.     Heart sounds: Normal heart sounds.  Pulmonary:     Effort: Pulmonary effort is normal.     Breath sounds: Normal breath sounds.  Musculoskeletal:     Cervical back: No tenderness.  Lymphadenopathy:     Cervical: No cervical adenopathy.  Skin:    General: Skin is warm and dry.     Capillary Refill: Capillary refill takes less than 2 seconds.  Neurological:     General: No focal deficit present.     Mental Status: He is alert and oriented to person, place, and time.  Psychiatric:        Mood and Affect: Mood normal.        Behavior: Behavior normal.      ASSESSMENT & PLAN: A total of 45 minutes was spent with the patient and counseling/coordination of care regarding preparing for this visit, review of most recent office visit notes, review of multiple chronic medical conditions and their management, review of all medications, review of most recent bloodwork results, review of health maintenance items, education on nutrition, prognosis, documentation, and need for follow up.   Problem List Items Addressed This Visit       Cardiovascular and Mediastinum   Hypertension associated with diabetes (HCC) - Primary    Well-controlled hypertension. Continue amlodipine 5 mg, lisinopril 40 mg, and carvedilol 25 mg twice a day Well-controlled diabetes with hemoglobin A1c of 6.3 Continue metformin 500 mg twice a day along with glipizide 5 mg with breakfast and daily Farxiga 10 mg Cardiovascular risks associated with hypertension and diabetes discussed Diet and nutrition discussed Follow-up in 6 months      Relevant Orders   Comprehensive metabolic panel   CBC with Differential/Platelet    Hemoglobin A1c   Lipid panel   Urine Microalbumin w/creat. ratio   Ischemic cardiomyopathy    Stable.  No recent anginal episodes. Continues daily baby aspirin along with carvedilol 25 mg twice a day      Relevant Orders   Comprehensive metabolic panel   CBC with Differential/Platelet   Hemoglobin A1c   Lipid panel     Endocrine   Dyslipidemia associated with type 2 diabetes mellitus (HCC)    Stable chronic condition. Continue rosuvastatin 20 mg daily. Diet and nutrition discussed      Relevant Orders   Comprehensive metabolic panel   CBC with Differential/Platelet   Hemoglobin A1c   Lipid panel   Urine Microalbumin w/creat. ratio   Patient Instructions  Diabetes Mellitus and Nutrition, Adult When you have diabetes, or diabetes mellitus, it is very important to have healthy eating habits because your blood sugar (glucose) levels are greatly affected by what you eat and drink. Eating healthy foods in the right amounts, at about the same times every day, can help you: Manage your blood glucose. Lower your risk of heart disease. Improve your blood pressure. Reach or maintain a healthy weight. What can affect my meal plan? Every person with diabetes is different, and each person has different needs for a meal plan. Your health care provider may recommend that you work with a dietitian  to make a meal plan that is best for you. Your meal plan may vary depending on factors such as: The calories you need. The medicines you take. Your weight. Your blood glucose, blood pressure, and cholesterol levels. Your activity level. Other health conditions you have, such as heart or kidney disease. How do carbohydrates affect me? Carbohydrates, also called carbs, affect your blood glucose level more than any other type of food. Eating carbs raises the amount of glucose in your blood. It is important to know how many carbs you can safely have in each meal. This is different for every person.  Your dietitian can help you calculate how many carbs you should have at each meal and for each snack. How does alcohol affect me? Alcohol can cause a decrease in blood glucose (hypoglycemia), especially if you use insulin or take certain diabetes medicines by mouth. Hypoglycemia can be a life-threatening condition. Symptoms of hypoglycemia, such as sleepiness, dizziness, and confusion, are similar to symptoms of having too much alcohol. Do not drink alcohol if: Your health care provider tells you not to drink. You are pregnant, may be pregnant, or are planning to become pregnant. If you drink alcohol: Limit how much you have to: 0-1 drink a day for women. 0-2 drinks a day for men. Know how much alcohol is in your drink. In the [place] one drink equals one 12 oz bottle of beer (355 mL), one 5 oz glass of wine (148 mL), or one 1 oz glass of hard liquor (44 mL). Keep yourself hydrated with water, diet soda, or unsweetened iced tea. Keep in mind that regular soda, juice, and other mixers may contain a lot of sugar and must be counted as carbs. What are tips for following this plan?  Reading food labels Start by checking the serving size on the Nutrition Facts label of packaged foods and drinks. The number of calories and the amount of carbs, fats, and other nutrients listed on the label are based on one serving of the item. Many items contain more than one serving per package. Check the total grams (g) of carbs in one serving. Check the number of grams of saturated fats and trans fats in one serving. Choose foods that have a low amount or none of these fats. Check the number of milligrams (mg) of salt (sodium) in one serving. Most people should limit total sodium intake to less than 2,300 mg per day. Always check the nutrition information of foods labeled as "low-fat" or "nonfat." These foods may be higher in added sugar or refined carbs and should be avoided. Talk to your dietitian to identify your  daily goals for nutrients listed on the label. Shopping Avoid buying canned, pre-made, or processed foods. These foods tend to be high in fat, sodium, and added sugar. Shop around the outside edge of the grocery store. This is where you will most often find fresh fruits and vegetables, bulk grains, fresh meats, and fresh dairy products. Cooking Use low-heat cooking methods, such as baking, instead of high-heat cooking methods, such as deep frying. Cook using healthy oils, such as olive, canola, or sunflower oil. Avoid cooking with butter, cream, or high-fat meats. Meal planning Eat meals and snacks regularly, preferably at the same times every day. Avoid going long periods of time without eating. Eat foods that are high in fiber, such as fresh fruits, vegetables, beans, and whole grains. Eat 4-6 oz (112-168 g) of lean protein each day, such as lean meat, chicken, fish,  eggs, or tofu. One ounce (oz) (28 g) of lean protein is equal to: 1 oz (28 g) of meat, chicken, or fish. 1 egg.  cup (62 g) of tofu. Eat some foods each day that contain healthy fats, such as avocado, nuts, seeds, and fish. What foods should I eat? Fruits Berries. Apples. Oranges. Peaches. Apricots. Plums. Grapes. Mangoes. Papayas. Pomegranates. Kiwi. Cherries. Vegetables Leafy greens, including lettuce, spinach, kale, chard, collard greens, mustard greens, and cabbage. Beets. Cauliflower. Broccoli. Carrots. Green beans. Tomatoes. Peppers. Onions. Cucumbers. Brussels sprouts. Grains Whole grains, such as whole-wheat or whole-grain bread, crackers, tortillas, cereal, and pasta. Unsweetened oatmeal. Quinoa. Brown or wild rice. Meats and other proteins Seafood. Poultry without skin. Lean cuts of poultry and beef. Tofu. Nuts. Seeds. Dairy Low-fat or fat-free dairy products such as milk, yogurt, and cheese. The items listed above may not be a complete list of foods and beverages you can eat and drink. Contact a dietitian for  more information. What foods should I avoid? Fruits Fruits canned with syrup. Vegetables Canned vegetables. Frozen vegetables with butter or cream sauce. Grains Refined white flour and flour products such as bread, pasta, snack foods, and cereals. Avoid all processed foods. Meats and other proteins Fatty cuts of meat. Poultry with skin. Breaded or fried meats. Processed meat. Avoid saturated fats. Dairy Full-fat yogurt, cheese, or milk. Beverages Sweetened drinks, such as soda or iced tea. The items listed above may not be a complete list of foods and beverages you should avoid. Contact a dietitian for more information. Questions to ask a health care provider Do I need to meet with a certified diabetes care and education specialist? Do I need to meet with a dietitian? What number can I call if I have questions? When are the best times to check my blood glucose? Where to find more information: American Diabetes Association: diabetes.org Academy of Nutrition and Dietetics: eatright.[employer] of Diabetes and Digestive and Kidney Diseases: StageSync.si Association of Diabetes Care & Education Specialists: diabeteseducator.org Summary It is important to have healthy eating habits because your blood sugar (glucose) levels are greatly affected by what you eat and drink. It is important to use alcohol carefully. A healthy meal plan will help you manage your blood glucose and lower your risk of heart disease. Your health care provider may recommend that you work with a dietitian to make a meal plan that is best for you. This information is not intended to replace advice given to you by your health care provider. Make sure you discuss any questions you have with your health care provider. Document Revised: 01/19/2020 Document Reviewed: 01/19/2020 Elsevier Patient Education  2024 Elsevier Inc.      Edwina Barth, MD [facility]

## 2023-06-08 NOTE — Progress Notes (Unsigned)
New Patient Note  RE: Scott Dodson MRN: [id number] DOB: January 19, 1951 Date of Office Visit: 06/09/2023  Consult requested by: Ashok Croon, MD Primary care provider: Georgina Quint, MD  Chief Complaint: No chief complaint on file.  History of Present Illness: I had the pleasure of seeing Scott Dodson for initial evaluation at the [facility] on 06/08/2023. He is a 72 y.o. male, who is referred here by Georgina Quint, MD for the evaluation of nasal polyps - biologics.  Discussed the use of AI scribe software for clinical note transcription with the patient, who gave verbal consent to proceed.  History of Present Illness         He reports symptoms of ***. Symptoms have been going on for *** years. The symptoms are present *** all year around with worsening in ***. Other triggers include exposure to ***. Anosmia: ***. Headache: ***. He has used *** with ***fair improvement in symptoms. Sinus infections: ***. Previous work up includes: ***. Previous ENT evaluation: ***. Previous sinus imaging: ***. History of nasal polyps: ***. Last eye exam: ***. History of reflux: ***.    ENT referral note: "Nasal polyps and chronic nasal congestion. Suspect allergy sx. Consult for Dupixent or other biologic for nasal polyps. "  03/31/2023 CT sinus: "IMPRESSION: 1. Numerous retention cysts and/or polyps along the floors of both maxillary sinuses. No layering fluid. Mild intervening mucosal thickening. 2. Scattered opacified anterior ethmoid air cells, left more than right. Mucosal thickening at the frontal ethmoid junction on the left. 3. Very small amount of fluid layering in the right division of the sphenoid sinus. No widespread mucosal thickening. 4. Nasal septum bows 4 mm towards the right with a right spur. Question several small scattered sessile mucosal polyps."  Assessment and Plan: Scott Dodson is a 72 y.o. male with: ***  Assessment and Plan                No follow-ups on file.  No orders of the defined types were placed in this encounter.  Lab Orders  No laboratory test(s) ordered today    Other allergy screening: Asthma: {Blank single:19197::"yes","no"} Rhino conjunctivitis: {Blank single:19197::"yes","no"} Food allergy: {Blank single:19197::"yes","no"} Medication allergy: {Blank single:19197::"yes","no"} Hymenoptera allergy: {Blank single:19197::"yes","no"} Urticaria: {Blank single:19197::"yes","no"} Eczema:{Blank single:19197::"yes","no"} History of recurrent infections suggestive of immunodeficency: {Blank single:19197::"yes","no"}  Diagnostics: Spirometry:  Tracings reviewed. His effort: {Blank single:19197::"Good reproducible efforts.","It was hard to get consistent efforts and there is a question as to whether this reflects a maximal maneuver.","Poor effort, data can not be interpreted."} FVC: ***L FEV1: ***L, ***% predicted FEV1/FVC ratio: ***% Interpretation: {Blank single:19197::"Spirometry consistent with mild obstructive disease","Spirometry consistent with moderate obstructive disease","Spirometry consistent with severe obstructive disease","Spirometry consistent with possible restrictive disease","Spirometry consistent with mixed obstructive and restrictive disease","Spirometry uninterpretable due to technique","Spirometry consistent with normal pattern","No overt abnormalities noted given today's efforts"}.  Please see scanned spirometry results for details.  Skin Testing: {Blank single:19197::"Select foods","Environmental allergy panel","Environmental allergy panel and select foods","Food allergy panel","None","Deferred due to recent antihistamines use"}. *** Results discussed with patient/family.   Past Medical History: Patient Active Problem List   Diagnosis Date Noted   HLD (hyperlipidemia) 06/28/2022   HTN (hypertension) 06/28/2022   Ischemic cardiomyopathy 05/21/2022   Esophageal dysphagia  05/21/2022   Chronic bilateral low back pain without sciatica 05/21/2022   Chronic left hip pain 05/21/2022   Age-related nuclear cataract of both eyes 02/05/2021   Vitreomacular adhesion of left eye 12/13/2019   Severe nonproliferative diabetic retinopathy of right  eye without macular edema associated with type 2 diabetes mellitus (HCC) 12/13/2019   Severe nonproliferative diabetic retinopathy of left eye, with macular edema, associated with type 2 diabetes mellitus (HCC) 12/13/2019   Vitreomacular adhesion of right eye 12/13/2019   History of diabetes mellitus 02/09/2018   CARDIOMYOPATHY, ISCHEMIC 05/18/2010   Coronary atherosclerosis 10/11/2009   Dyslipidemia associated with type 2 diabetes mellitus (HCC) 10/09/2009   HYPERLIPIDEMIA 10/09/2009   Hypertension associated with diabetes (HCC) 10/09/2009   MYOCARDIAL INFARCTION, HX OF 10/09/2009   Past Medical History:  Diagnosis Date   Allergy    Asthma    Coronary artery disease    s/p lateral MI 4 /11, s/p Promus DES OM2   Diabetes mellitus without complication (HCC)    Heart murmur    Hyperlipidemia    Hypertension    Myocardial infarct (HCC) 2011   Past Surgical History: Past Surgical History:  Procedure Laterality Date   ADENOIDECTOMY     CORONARY ARTERY BYPASS GRAFT  2011   CORONARY STENT PLACEMENT     TONSILLECTOMY     TYMPANIC MEMBRANE REPAIR Right 2004   UPPER GASTROINTESTINAL ENDOSCOPY     VASECTOMY     Medication List:  Current Outpatient Medications  Medication Sig Dispense Refill   albuterol (VENTOLIN HFA) 108 (90 Base) MCG/ACT inhaler Inhale 2 puffs into the lungs every 6 (six) hours as needed for wheezing or shortness of breath. 8 g 2   amLODipine (NORVASC) 5 MG tablet TAKE 1 TABLET BY MOUTH DAILY 100 tablet 2   amoxicillin-clavulanate (AUGMENTIN) 875-125 MG tablet Take 1 tablet by mouth 2 (two) times daily. (Patient not taking: Reported on 05/26/2023) 20 tablet 0   Ascorbic Acid (VITAMIN C) 1000 MG tablet  Take 2,000 mg by mouth daily.     aspirin 81 MG tablet Take 81 mg by mouth daily.     carvedilol (COREG) 25 MG tablet TAKE 1 TABLET BY MOUTH TWICE  DAILY WITH MEALS 200 tablet 2   cetirizine (ZYRTEC ALLERGY) 10 MG tablet Take 1 tablet (10 mg total) by mouth at bedtime. 90 tablet 1   Coenzyme Q10 (CO Q 10 PO) Take 1 tablet by mouth daily.     desloratadine (CLARINEX) 5 MG tablet Take 1 tablet (5 mg total) by mouth daily. 90 tablet 3   famotidine (PEPCID) 20 MG tablet Take 1 tablet (20 mg total) by mouth 2 (two) times daily. 30 tablet 3   FARXIGA 10 MG TABS tablet TAKE 1 TABLET BY MOUTH DAILY  BEFORE BREAKFAST 100 tablet 2   fluticasone (FLONASE) 50 MCG/ACT nasal spray Place 2 sprays into both nostrils daily. 16 g 6   glipiZIDE (GLUCOTROL) 5 MG tablet TAKE 1 TABLET BY MOUTH DAILY  WITH BREAKFAST 100 tablet 2   Ipratropium-Albuterol (COMBIVENT RESPIMAT) 20-100 MCG/ACT AERS respimat Inhale 1 puff into the lungs every 6 (six) hours. 1 each 0   lisinopril (ZESTRIL) 40 MG tablet TAKE 1 TABLET BY MOUTH DAILY 100 tablet 2   metFORMIN (GLUCOPHAGE) 500 MG tablet TAKE 1 TABLET BY MOUTH TWICE  DAILY WITH MEALS 200 tablet 2   methylPREDNISolone (MEDROL DOSEPAK) 4 MG TBPK tablet Take with signs of chronic sinusitis and take as directed 1 each 1   rosuvastatin (CRESTOR) 20 MG tablet TAKE 1 TABLET BY MOUTH DAILY 100 tablet 2   No current facility-administered medications for this visit.   Allergies: No Known Allergies Social History: Social History   Socioeconomic History   Marital status: Married  Spouse name: Not on file   Number of children: 3   Years of education: Not on file   Highest education level: Not on file  Occupational History   Occupation: Company secretary   Occupation: retired  Tobacco Use   Smoking status: Former    Current packs/day: 0.00    Average packs/day: 0.5 packs/day for 5.0 years (2.5 ttl pk-yrs)    Types: Cigarettes    Start date: 10/13/1969    Quit date:  10/14/1974    Years since quitting: 48.6   Smokeless tobacco: Never  Vaping Use   Vaping status: Never Used  Substance and Sexual Activity   Alcohol use: Yes    Alcohol/week: 5.0 standard drinks of alcohol    Types: 5 Standard drinks or equivalent per week   Drug use: No   Sexual activity: Yes  Other Topics Concern   Not on file  Social History Narrative   Occupation- Writer    Former -tobacco -stopped 1979   Married ,3 children     Alcohol use - yes , social    Drug use - no   Regular exercise- yes    Social Determinants of Health   Financial Resource Strain: Low Risk  (12/25/2022)   Overall Financial Resource Strain (CARDIA)    Difficulty of Paying Living Expenses: Not hard at all  Food Insecurity: No Food Insecurity (12/25/2022)   Hunger Vital Sign    Worried About Running Out of Food in the Last Year: Never true    Ran Out of Food in the Last Year: Never true  Transportation Needs: No Transportation Needs (12/25/2022)   PRAPARE - Administrator, Civil Service (Medical): No    Lack of Transportation (Non-Medical): No  Physical Activity: Sufficiently Active (12/25/2022)   Exercise Vital Sign    Days of Exercise per Week: 3 days    Minutes of Exercise per Session: 100 min  Stress: No Stress Concern Present (12/25/2022)   [employer] of Occupational Health - Occupational Stress Questionnaire    Feeling of Stress : Not at all  Social Connections: Moderately Integrated (12/25/2022)   Social Connection and Isolation Panel [NHANES]    Frequency of Communication with Friends and Family: Once a week    Frequency of Social Gatherings with Friends and Family: Once a week    Attends Religious Services: More than 4 times per year    Active Member of [employer] or Organizations: Yes    Attends Banker Meetings: 1 to 4 times per year    Marital Status: Married   Lives in a ***. Smoking: *** Occupation: ***  Environmental HistoryArt gallery manager in the house: Copywriter, advertising in the family room: {Blank single:19197::"yes","no"} Carpet in the bedroom: {Blank single:19197::"yes","no"} Heating: {Blank single:19197::"electric","gas","heat pump"} Cooling: {Blank single:19197::"central","window","heat pump"} Pet: {Blank single:19197::"yes ***","no"}  Family History: Family History  Problem Relation Age of Onset   Heart disease Mother    Hypertension Mother    Heart disease Father    Hypertension Father    Heart disease Brother    Heart disease Sister    Colon cancer Maternal Grandfather 78   Colon polyps Neg Hx    Esophageal cancer Neg Hx    Stomach cancer Neg Hx    Rectal cancer Neg Hx    Problem  Relation Asthma                                   *** Eczema                                *** Food allergy                          *** Allergic rhino conjunctivitis     ***  Review of Systems  Constitutional:  Negative for appetite change, chills, fever and unexpected weight change.  HENT:  Negative for congestion and rhinorrhea.   Eyes:  Negative for itching.  Respiratory:  Negative for cough, chest tightness, shortness of breath and wheezing.   Cardiovascular:  Negative for chest pain.  Gastrointestinal:  Negative for abdominal pain.  Genitourinary:  Negative for difficulty urinating.  Skin:  Negative for rash.  Neurological:  Negative for headaches.    Objective: There were no vitals taken for this visit. There is no height or weight on file to calculate BMI. Physical Exam Vitals and nursing note reviewed.  Constitutional:      Appearance: Normal appearance. He is well-developed.  HENT:     Head: Normocephalic and atraumatic.     Right Ear: Tympanic membrane and external ear normal.     Left Ear: Tympanic membrane and external ear normal.     Nose: Nose normal.     Mouth/Throat:     Mouth: Mucous membranes are moist.     Pharynx: Oropharynx is  clear.  Eyes:     Conjunctiva/sclera: Conjunctivae normal.  Cardiovascular:     Rate and Rhythm: Normal rate and regular rhythm.     Heart sounds: Normal heart sounds. No murmur heard.    No friction rub. No gallop.  Pulmonary:     Effort: Pulmonary effort is normal.     Breath sounds: Normal breath sounds. No wheezing, rhonchi or rales.  Musculoskeletal:     Cervical back: Neck supple.  Skin:    General: Skin is warm.     Findings: No rash.  Neurological:     Mental Status: He is alert and oriented to person, place, and time.  Psychiatric:        Behavior: Behavior normal.    The plan was reviewed with the patient/family, and all questions/concerned were addressed.  It was my pleasure to see Scott Dodson today and participate in his care. Please feel free to contact me with any questions or concerns.  Sincerely,  Wyline Mood, DO Allergy & Immunology  [facility] office: [phone] [facility] office: [phone]

## 2023-06-09 ENCOUNTER — Encounter: Payer: Self-pay | Admitting: Allergy

## 2023-06-09 ENCOUNTER — Other Ambulatory Visit: Payer: Self-pay

## 2023-06-09 ENCOUNTER — Ambulatory Visit: Payer: Medicare Other | Admitting: Allergy

## 2023-06-09 VITALS — BP 118/62 | HR 52 | Temp 98.1°F | Ht 68.54 in | Wt 196.4 lb

## 2023-06-09 DIAGNOSIS — R1310 Dysphagia, unspecified: Secondary | ICD-10-CM

## 2023-06-09 DIAGNOSIS — J339 Nasal polyp, unspecified: Secondary | ICD-10-CM | POA: Diagnosis not present

## 2023-06-09 DIAGNOSIS — J3089 Other allergic rhinitis: Secondary | ICD-10-CM | POA: Diagnosis not present

## 2023-06-09 DIAGNOSIS — J4540 Moderate persistent asthma, uncomplicated: Secondary | ICD-10-CM | POA: Diagnosis not present

## 2023-06-09 MED ORDER — FLUTICASONE FUROATE-VILANTEROL 100-25 MCG/ACT IN AEPB: 1.0000 | INHALATION_SPRAY | Freq: Every day | RESPIRATORY_TRACT | 3 refills | Status: DC

## 2023-06-09 NOTE — Patient Instructions (Addendum)
Nasal polyps Read handout on nasal polyps - Dupixent - every 2 week injection. Will start the prior authorization process.  [email or web address] Continue nasal regimen as per your ENT.  Breathing  Daily controller medication(s): start Breo 1 puff once a day and rinse mouth after each use.  If not covered let us know.  May use albuterol rescue inhaler 2 puffs every 4 to 6 hours as needed for shortness of breath, chest tightness, coughing, and wheezing.  Monitor frequency of use - if you need to use it more than twice per week on a consistent basis let us know.  Breathing control goals:  Full participation in all desired activities (may need albuterol before activity) Albuterol use two times or less a week on average (not counting use with activity) Cough interfering with sleep two times or less a month Oral steroids no more than once a year No hospitalizations   Environmental allergies Return for allergy skin testing. Will make additional recommendations based on results. Make sure you don't take any antihistamines for 3 days before the skin testing appointment. Don't put any lotion on the back and arms on the day of testing.  Plan on being here for 30-60 minutes.   Dysphagia Recommend EGD with biopsy to rule out eosinophilic esophagitis.   Follow up in 2 months or sooner if needed - check on breathing and polyps.

## 2023-06-18 ENCOUNTER — Telehealth: Payer: Self-pay | Admitting: *Deleted

## 2023-06-18 NOTE — Telephone Encounter (Signed)
Called patient to discuss Dupixent. Unfortunately he does not qualify for PAP due to income. I have explained the prescription payment plan and he will look into same and let me know if he wants to proceed

## 2023-06-18 NOTE — Telephone Encounter (Signed)
-----   Message from Ellamae Sia sent at 06/09/2023 10:19 AM EST ----- Please start PA Dupixent for nasal polyps. Thank you.

## 2023-06-24 NOTE — Telephone Encounter (Signed)
Patient called and he wants to start Dupixent and will reach out to [facility] to sign up for PPP and then reach back out to me

## 2023-06-30 ENCOUNTER — Ambulatory Visit (HOSPITAL_COMMUNITY): Payer: Medicare Other | Attending: Cardiology

## 2023-06-30 ENCOUNTER — Telehealth: Payer: Self-pay

## 2023-06-30 DIAGNOSIS — E118 Type 2 diabetes mellitus with unspecified complications: Secondary | ICD-10-CM | POA: Diagnosis not present

## 2023-06-30 DIAGNOSIS — I252 Old myocardial infarction: Secondary | ICD-10-CM

## 2023-06-30 DIAGNOSIS — Z01810 Encounter for preprocedural cardiovascular examination: Secondary | ICD-10-CM

## 2023-06-30 LAB — ECHOCARDIOGRAM COMPLETE
Area-P 1/2: 2.97 cm2
S' Lateral: 4.1 cm

## 2023-06-30 NOTE — Telephone Encounter (Addendum)
Called patient regarding results. Left detailed message for patient regarding results.----- Message from Joni Reining sent at 06/30/2023 12:54 PM EST ----- I have reviewed his echocardiogram report. Normal heart pumping function with mild relaxation stiffening (Grade 1). Heart valves show no abnormalities of concern.

## 2023-07-01 ENCOUNTER — Encounter (INDEPENDENT_AMBULATORY_CARE_PROVIDER_SITE_OTHER): Payer: Self-pay

## 2023-07-09 ENCOUNTER — Other Ambulatory Visit: Payer: Self-pay | Admitting: Allergy

## 2023-07-30 ENCOUNTER — Other Ambulatory Visit (INDEPENDENT_AMBULATORY_CARE_PROVIDER_SITE_OTHER): Payer: Self-pay | Admitting: Otolaryngology

## 2023-08-04 ENCOUNTER — Ambulatory Visit (INDEPENDENT_AMBULATORY_CARE_PROVIDER_SITE_OTHER): Payer: Medicare Other | Admitting: Otolaryngology

## 2023-08-04 ENCOUNTER — Encounter (INDEPENDENT_AMBULATORY_CARE_PROVIDER_SITE_OTHER): Payer: Self-pay | Admitting: Otolaryngology

## 2023-08-04 VITALS — BP 156/80 | HR 53

## 2023-08-04 DIAGNOSIS — J329 Chronic sinusitis, unspecified: Secondary | ICD-10-CM

## 2023-08-04 DIAGNOSIS — J4520 Mild intermittent asthma, uncomplicated: Secondary | ICD-10-CM

## 2023-08-04 DIAGNOSIS — J339 Nasal polyp, unspecified: Secondary | ICD-10-CM | POA: Diagnosis not present

## 2023-08-04 DIAGNOSIS — R0982 Postnasal drip: Secondary | ICD-10-CM | POA: Diagnosis not present

## 2023-08-04 DIAGNOSIS — J309 Allergic rhinitis, unspecified: Secondary | ICD-10-CM | POA: Diagnosis not present

## 2023-08-04 DIAGNOSIS — J343 Hypertrophy of nasal turbinates: Secondary | ICD-10-CM

## 2023-08-04 DIAGNOSIS — R0981 Nasal congestion: Secondary | ICD-10-CM | POA: Diagnosis not present

## 2023-08-04 DIAGNOSIS — J342 Deviated nasal septum: Secondary | ICD-10-CM | POA: Diagnosis not present

## 2023-08-04 NOTE — H&P (View-Only) (Signed)
 ENT Progress Note:  Update 08/04/23 Discussed the use of AI scribe software for clinical note transcription with the patient, who gave verbal consent to proceed.  History of Present Illness   The patient is a 73 year old male with hx of chronic nasal congestion and bilateral nasal polyps who presents for f/u.  He has been doing steroid nasal rinses, including a device that allows rinsing in both directions similar to Navage, which improved their nasal congestion, sense of smell, and ease of breathing through the nose.  They have arranged a payment plan with their insurance company to be able to start Dupixent. They have a history of a heart attack in 2011 and have undergone heart checks, including echocardiograms, with no recent issues. They have a cardiologist, Dr. Clifton James, and had seen their team for preop clearance already and had Echo done.      Records Reviewed:  Cariology clearance note 05/19/23 Preoperative cardiac evaluation:   According to the Revised Cardiac Risk Index (RCRI), his Perioperative Risk of Major Cardiac Event is (%): 0.4   His Functional Capacity in METs is: 7.66 according to the Duke Activity Status Index (DASI).    Per office protocol, if patient is without any new symptoms or concerns at the time of their virtual visit, he/she may hold ASA for 7 days prior to procedure. Please resume ASA as soon as possible postprocedure, at the discretion of the surgeon.     However due to to history of CAD hypertension and diabetes, I am going to repeat his echocardiogram for any changes in LV function prior to clearing for surgery.  If echocardiogram is normal we will release him to move forward with his planned procedure.    2.  Coronary artery disease: Status post STEMI in 2011 with DES to the ostium of the obtuse marginal of the circumflex.   3.  Hypercholesterolemia: Followed by primary care provider.  Goal of LDL less than 70.   4.  Hypertension: Currently well-controlled  on medication regimen.  Primary care provider is managing labs.   Scott Dodson would like to be reestablished with Dr. Clifton James as he was his primary cardiologist in the past.  We will make sure he has a follow-up appointment with him in a year to 18 months to continue relationship.  Update 04/28/23: he returns for follow-up after neck U/S and MBS//esophagram CT sinuses. Reports no changes in the size of neck lump, denies pain or skin changes. Swallowing function is stable, on regular diet. Continues to have nasal congestion, and started nasal saline rinses. On antihistamine. On Famotidine.   Initial consult 03/13/23:   Reason for Consult: neck mass and dysphagia  nasal congestion phlegm   HPI: Scott Dodson is an 73 y.o. male with hx of MI in 2011, history of diabetes, former smoking, quit in 70s, right TM reconstruction due to history of chronic ear infections performed, who is here for evaluation of longstanding chronic dysphagia symptoms and left anterior neck mass.   He is here for chronic dysphagia symptoms primarily with solids. He reports no issues with liquids. When he is eating solid foods it would get stuck in his throat. Eventually it all goes down. He has food regurgitation (undigested). Usually bread is hardest for him. Foods with particles and dry foods cause him to cough and choke.  This has been a problem for over 4-5 yrs.  He reports persistent phlegm in the back of his throat.  He also reports chronic nasal congestion and  symptoms of difficulties with breathing through his nose.  He was evaluated by Dr. Ernestene Kiel, and had MBS and esophagram.  He also had CT neck.  I reviewed all imaging and summarized findings in assessment and plan.   She reports longstanding history of a lump along the left anterior neck, that did not bother him, but was noted by one of the physicians who saw him in the past, denies pain, denies significant changes in size of the lump. Of note CT soft tissue neck did  not mention any lymphadenopathy or any evidence of anterior left neck mass that was performed on September 25, 2022.   Records Reviewed:  Dr Harvie Junior note from 11/04/22 Scott Dodson is a 73 y.o. year old male who presents for evaluation of a left neck nodule.  The patient reports a non-painful lump on the left side of his neck, which he first noticed approximately a year ago. The nodule fluctuates in size, occasionally decreasing in size. He denies experiencing fevers, chills, night sweats, or weight loss.  The patient experiences dysphagia, particularly when consuming tablets and solids. He describes a sensation of food becoming lodged in his throat. He has not yet scheduled his swallow study ordered by Gastroenterology. He denies experiencing reflux. Depending on the type of food consumed, he experiences choking and coughing.  He has no history of sinus surgery. He denies significant nasal congestion and generally breathes through his nose without difficulty. He experiences facial pain and pressure during colds. His olfactory function is normal. He experiences persistent throat drainage. He has Flonase available for use. He suffers from numerous allergies.  He smoked a long time ago. He drinks 2 alcoholic beverages per night. He is retired from his main job and now works part-time. He works for [employer].   GI records Dr. Amada Jupiter 08/30/22 Clinical details of this patient's symptoms are in my July 03, 2022 office consult note. About 2 years of chronic solid food dysphagia. Scott Dodson reports having noticed a lump in the left side of his neck for the last few weeks.    EGD   The examined esophagus was normal. The scope was                            withdrawn. Dilation was performed with a Maloney                            dilator with no resistance at 52 Fr.                           The stomach was normal.                           The cardia and gastric fundus were normal on                             retroflexion. (Hill grade 1)                           The examined duodenum was normal.                           The larynx could not be well-visualized. Complications:  No immediate complications.   Past Medical History:  Diagnosis Date   Allergy    Asthma    Coronary artery disease    s/p lateral MI 4 /11, s/p Promus DES OM2   Diabetes mellitus without complication (HCC)    Heart murmur    Hyperlipidemia    Hypertension    Myocardial infarct [facility]) 2011    Past Surgical History:  Procedure Laterality Date   ADENOIDECTOMY     CORONARY ARTERY BYPASS GRAFT  2011   CORONARY STENT PLACEMENT     TONSILLECTOMY     TYMPANIC MEMBRANE REPAIR Right 2004   UPPER GASTROINTESTINAL ENDOSCOPY     VASECTOMY      Family History  Problem Relation Age of Onset   Heart disease Mother    Hypertension Mother    Heart disease Father    Hypertension Father    Heart disease Brother    Heart disease Sister    Colon cancer Maternal Grandfather 72   Colon polyps Neg Hx    Esophageal cancer Neg Hx    Stomach cancer Neg Hx    Rectal cancer Neg Hx     Social History:  reports that he quit smoking about 48 years ago. His smoking use included cigarettes. He started smoking about 53 years ago. He has a 2.5 pack-year smoking history. He has never used smokeless tobacco. He reports current alcohol use of about 5.0 standard drinks of alcohol per week. He reports that he does not use drugs.  Allergies: No Known Allergies  Medications: I have reviewed the patient's current medications.  The PMH, PSH, Medications, Allergies, and SH were reviewed and updated.  ROS: Constitutional: Negative for fever, weight loss and weight gain. Cardiovascular: Negative for chest pain and dyspnea on exertion. Respiratory: Is not experiencing shortness of breath at rest. Gastrointestinal: Negative for nausea and vomiting. Neurological: Negative for headaches. Psychiatric: The  patient is not nervous/anxious  Blood pressure (!) 156/80, pulse (!) 53, SpO2 98%.  PHYSICAL EXAM:  Exam: General: Well-developed, well-nourished Respiratory Respiratory effort: Equal inspiration and expiration without stridor Cardiovascular Peripheral Vascular: Warm extremities with equal color/perfusion Eyes: No nystagmus with equal extraocular motion bilaterally Neuro/Psych/Balance: Patient oriented to person, place, and time; Appropriate mood and affect; Gait is intact with no imbalance; Cranial nerves I-XII are intact Head and Face Inspection: Normocephalic and atraumatic without mass or lesion Palpation: Facial skeleton intact without bony stepoffs Salivary Glands: No mass or tenderness Facial Strength: Facial motility symmetric and full bilaterally ENT Pinna: External ear intact and fully developed External canal: Canal is patent with intact skin Tympanic Membrane: Clear and mobile External Nose: No scar or anatomic deformity Internal Nose: Septum is deviated to the left. Bilateral nasal polyps, no purulence. Mucosal edema and erythema present.  Bilateral inferior turbinate hypertrophy.  Lips, Teeth, and gums: Mucosa and teeth intact and viable, missing teeth on exam TMJ: No pain to palpation with full mobility Oral cavity/oropharynx: No erythema or exudate, no lesions present Nasopharynx: No mass or lesion with intact mucosa Neck Neck and Trachea: Midline trachea without mass or lesion. Previously seen palpable 1-2 cm mobile round neck mass along anterior left SCM near the level of thyroid cartilage, no skin changes   Procedure: PROCEDURE NOTE: nasal endoscopy  Preoperative diagnosis: chronic sinusitis symptoms/nasal polyposis   Postoperative diagnosis: same + bilateral nasal polyps   Procedure: Diagnostic nasal endoscopy (08657)  Surgeon: Ashok Croon, M.D.  Anesthesia: Topical lidocaine and Afrin  H&P REVIEW: The patient's history and  physical were reviewed  today prior to procedure. All medications were reviewed and updated as well. Complications: None Condition is stable throughout exam Indications and consent: The patient presents with symptoms of chronic sinusitis not responding to previous therapies. All the risks, benefits, and potential complications were reviewed with the patient preoperatively and informed consent was obtained. The time out was completed with confirmation of the correct procedure.   Procedure: The patient was seated upright in the clinic. Topical lidocaine and Afrin were applied to the nasal cavity. After adequate anesthesia had occurred, the rigid nasal endoscope was passed into the nasal cavity. The nasal mucosa, turbinates, septum, and sinus drainage pathways were visualized bilaterally. This revealed no purulence or significant secretions that might be cultured. There were bilateral polyps and mucosal edema. The mucosa was intact and there was no crusting present. The scope was then slowly withdrawn and the patient tolerated the procedure well. There were no complications or blood loss.  Studies Reviewed: CT soft tissue neck 09/25/2022 EXAM: CT NECK WITH CONTRAST   TECHNIQUE: Multidetector CT imaging of the neck was performed using the standard protocol following the bolus administration of intravenous contrast.   RADIATION DOSE REDUCTION: This exam was performed according to the departmental dose-optimization program which includes automated exposure control, adjustment of the mA and/or kV according to patient size and/or use of iterative reconstruction technique.   CONTRAST:  75mL OMNIPAQUE IOHEXOL 350 MG/ML SOLN   COMPARISON:  None Available.   FINDINGS: Pharynx and larynx: No evidence of a mass or swelling. Widely patent airway. No fluid collection or inflammatory changes in the parapharyngeal or retropharyngeal spaces.   Salivary glands: No inflammation, mass, or stone.   Thyroid: Unremarkable.   Lymph  nodes: No enlarged or suspicious lymph nodes in the neck.   Vascular: Major vascular structures of the neck are patent. Mild atherosclerotic calcification at the carotid bifurcations without evidence of significant stenosis.   Limited intracranial: Unremarkable   Visualized orbits: Unremarkable.   Mastoids and visualized paranasal sinuses: Mucous retention cysts in the maxillary sinuses. Clear mastoid air cells.   Skeleton: Bulky anterior vertebral ossification throughout the cervical spine. No suspicious osseous lesion.   Upper chest: Clear lung apices.   Other: None.   IMPRESSION: No neck mass, lymphadenopathy, or acute abnormality identified.  Swallow study MBS and esophagram 02/27/23 Esophagram FINDINGS: Swallowing: Appears normal. No vestibular penetration or aspiration seen. Residual contrast remains in vallecula.   Pharynx: Unremarkable.   Esophagus: Normal appearance.  No mucosal lesions or strictures   Esophageal motility: Within normal limits.   Hiatal Hernia: None.   Gastroesophageal reflux: None visualized even with provocative maneuvers such as cough and water siphon test.   Ingested 13 mm barium tablet: Tablet was given during modified barium swallow. See additional report for findings.   IMPRESSION: No evidence of aspiration.   Negative esophagram.  MBS 02/27/23 HPI/PMH: HPI: Pt with c/o "difficulty swallowing pills"; esophagram completed prior to MBS.  Pt denies any neurological hx of CVA, GERD or PNA related to swallowing.     Clinical Impression: Clinical Impression: Pt presents with mild pharyngoesophageal dysphagia c/b brief trace vestibular penetration (PAS 2) with small amounts of thin liquids/nectar thick liquids d/t partial superior laryngeal movement and decreased anterior movement paired with partial epiglottic inversion.  Pt able to consume larger volumes of thin without this occurrence; although vallecular/pyriform sinus retention noted  d/t partial esophageal obstruction of flow.  Pt utilized repetitive swallows, liquid wash and effortful swallow to clear  vallecular space and pyriform sinuses/posterior pharyngeal wall residue with good results.  Pt unable to swallow 13 mm tablet despite use of compensatory strategies, so pill was regurgitated into oral cavity and removed.  No aspiration noted with any consistency.  Recommend regular/thin liquid diet with utilization of strategies listed above.  Pt given esophageal precautions and strategies utilized during study to implement during meals.  Pt agreeable and appreciative.  If symptoms persist, may consider short-term ST for pharyngoesophageal symptoms prn.  Thank you for this consult.      Assessment/Plan: Encounter Diagnoses  Name Primary?   Nasal polyps Yes   Chronic sinusitis, unspecified location    Nasal septal deviation    Hypertrophy of both inferior nasal turbinates    Chronic nasal congestion    Post-nasal drainage    Mild intermittent asthma without complication       Chronic nasal congestion and nasal obstruction, evidence of nasal polyps b/l on nasal endoscopy today with post-nasal drainage and mucosal edema, concern for chronic sinusitis. Will initiate medical management below and obtain CT sinuses  - I discussed with the patient that his sx are likely 2/2 undiagnosed chronic environmental allergies  - CBC with diff + total IgE to complete workup for CRSwNP -I reviewed CT neck he had back in March of this year and there is evidence of mucosal thickening bilaterally maxillary sinuses concerning for chronic sinus disease - nasal saline rinses  2. Anterior neck lump - palpable round left anterior neck mass, ~ 1-2 cm in size, along left mid SCM border on exam, I reviewed CT neck he had back in March and there was no evidence of lymphadenopathy or suspicious masses back then - neck soft tissue U/S to better evaluate -Will consider repeat CT neck if  needed -Differential diagnosis includes cervical lymph node versus lipoma versus other pathology -he does have a remote history of smoking  3.  Chronic dysphagia -patient with longstanding chronic symptoms of dysphagia limited to solids and pills, I reviewed results of his recent modified barium swallow and esophagram which showed mild pharyngoesophageal dysphagia per SLP report no aspiration, images of the swallow study also demonstrate a prominent osteophyte which I suspect could be one of the reasons for dysphagia symptoms, although esophagram is somewhat limited in the segment of UES, I did not appreciate prominent CP bar.  -Flexible laryngoscopy portion of the exam today with evidence of minimal secretions along hypopharynx, bilateral vocal folds were mobile, mildly atrophic, there was evidence of moderate postcricoid edema/pachydermia -I discussed exam findings with the patient and we will plan to consider swallow therapy in the future as well as a consultation with spine surgery -At this time he does not have weight loss or any evidence of aspiration, although could benefit from swallow therapy and if candidate for osteophyte removal, surgical management of cervical osteophyte could improve his symptoms. -Will further discuss when he returns after imaging  4. GERD LPR -evidence of findings consistent with GERD LPR on flexible laryngoscopy today which could also contribute to sensation of phlegm and lump in his throat and dysphagia symptoms - Famotidine 20 mg BID  - reflux gourmet trial -diet lifestyle changes to minimize reflux    - RTC after testing   Update 04/28/23 CT sinuses with evidence of mucus retention cysts in maxillary sinuses, otherwise no evidence of CRS, also had evidence of septal deviation and ITH and what appears like nasal polyps along b/l nasal passages, which is c/w with his nasal endoscopy.  Chronic nasal congestion/obstruction and evidence of nasal polyps and NSD/ITH  on CT sinuses and repeat nasal endoscopy today  - CT findings c/w NSD/ITH and b/l nasal polyps - will use for image guidance if he elects to proceed with surgery  - he did not complete labs (CBC/diff and total IgE) - we discussed management of chronic nasal congestion and risks and benefits of surgical removal of nasal polyps/septoplasty/ITR and he is interested - will refer to Allergy for Dupixent consultation to prevent polyp recurrence  - start nasal saline/steroid rinses - Rx sent for mometasone (will stop Flonase) - continue Clarinex 5 mg daily   Chronic dysphagia - on regular/thin liquid diet right now - patient with longstanding chronic symptoms of dysphagia limited to solids and pills, I reviewed results of his recent modified barium swallow and esophagram which showed mild pharyngoesophageal dysphagia per SLP report no aspiration, images of the swallow study also demonstrate a prominent osteophyte which I suspect could be one of the reasons for dysphagia symptoms, although esophagram is somewhat limited in the segment of UES, I did not appreciate prominent CP bar.  -Flexible laryngoscopy during the initial consultation with evidence of minimal secretions along hypopharynx, bilateral vocal folds were mobile, mildly atrophic, there was evidence of moderate postcricoid edema/pachydermia - will consider consulting Spine Surgery if dysphagia sx worsen (2/2 evidence of cervical osteophyte - will proceed with swallow therapy referral   3. Neck lipoma  - neck U/S with evidence of 2.6 cm subcutaneous nodule (same density as surrounding fat) c/w likely lipoma - he denies any recent changes in size, pain or numbness/sx from the mass, has had it for a long time - will observe and repeat imaging if grows over time or if he develops sx - had flexible scope without evidence of mass/tumor during his last office visit   4. Hx of environmental allergies  - schedule Allergy consultation and medical  management as above  5. Hx of smoking, quit several years ago, reports an episode of hypoxia in the setting of URI, requiring admission to the hospital for a few days   - Pulmonary consultation to rule out lung disease such as reactive airway disease or COPD   6. Chronic GERD LPR - continue Famotidine 20 mg daily (refill sent) and reflux gourmet   Update 08/04/23 Assessment and Plan    CRS with Bilateral Nasal Polyps Chronic bilateral nasal polyps causing  chronic nasal congestion and anosmia. Current treatment with nasal steroid rinses has provided some symptom relief. Persistent polyps on nasal endoscopy today. Discussed surgery versus medical management with Dupixent. Surgery aims to improve nasal airflow and facilitate better rinsing/remove nasal polyps. Dupixent is for preventing polyp regrowth. The patient opted for surgical removal with post-op Dupixent. - Schedule for b/l nasal endoscopy with polyp removal, possible bilateral maxillary antrostomy possible septoplasty and inferior turbinate reduction - Fax cardiology clearance request  - already seen and had repeat Echo, which appears without major changes - Continue nasal steroid rinses  Chronic nasal congestion and environmental allergies Dupixent recommended by allergy specialist to prevent polyp regrowth. Patient has arranged a payment plan with insurance.  - Continue current allergy management - proceed with Dupixent therapy - continue nasal steroid rinses - continue daily Clarinex 5 mg   Follow-up - Await call from surgery scheduling team - Follow up with cardiologist for surgical clearance.     Ashok Croon, MD Otolaryngology [facility] Phone: [phone] Fax: [phone]    08/05/2023, 6:55 AM

## 2023-08-04 NOTE — Progress Notes (Unsigned)
ENT Progress Note:  Update 08/04/23 Discussed the use of AI scribe software for clinical note transcription with the patient, who gave verbal consent to proceed.  History of Present Illness   The patient is a 73 year old male with hx of chronic nasal congestion and bilateral nasal polyps who presents for f/u.  He has been doing steroid nasal rinses, including a device that allows rinsing in both directions similar to Navage, which improved their nasal congestion, sense of smell, and ease of breathing through the nose.  They have arranged a payment plan with their insurance company to be able to start Dupixent. They have a history of a heart attack in 2011 and have undergone heart checks, including echocardiograms, with no recent issues. They have a cardiologist, Dr. Clifton James, and had seen their team for preop clearance already and had Echo done.      Records Reviewed:  Cariology clearance note 05/19/23 Preoperative cardiac evaluation:   According to the Revised Cardiac Risk Index (RCRI), his Perioperative Risk of Major Cardiac Event is (%): 0.4   His Functional Capacity in METs is: 7.66 according to the Duke Activity Status Index (DASI).    Per office protocol, if patient is without any new symptoms or concerns at the time of their virtual visit, he/she may hold ASA for 7 days prior to procedure. Please resume ASA as soon as possible postprocedure, at the discretion of the surgeon.     However due to to history of CAD hypertension and diabetes, I am going to repeat his echocardiogram for any changes in LV function prior to clearing for surgery.  If echocardiogram is normal we will release him to move forward with his planned procedure.    2.  Coronary artery disease: Status post STEMI in 2011 with DES to the ostium of the obtuse marginal of the circumflex.   3.  Hypercholesterolemia: Followed by primary care provider.  Goal of LDL less than 70.   4.  Hypertension: Currently well-controlled  on medication regimen.  Primary care provider is managing labs.   Scott Dodson would like to be reestablished with Dr. Clifton James as he was his primary cardiologist in the past.  We will make sure he has a follow-up appointment with him in a year to 18 months to continue relationship.  Update 04/28/23: he returns for follow-up after neck U/S and MBS//esophagram CT sinuses. Reports no changes in the size of neck lump, denies pain or skin changes. Swallowing function is stable, on regular diet. Continues to have nasal congestion, and started nasal saline rinses. On antihistamine. On Famotidine.   Initial consult 03/13/23:   Reason for Consult: neck mass and dysphagia  nasal congestion phlegm   HPI: Scott Dodson is an 73 y.o. male with hx of MI in 2011, history of diabetes, former smoking, quit in 70s, right TM reconstruction due to history of chronic ear infections performed, who is here for evaluation of longstanding chronic dysphagia symptoms and left anterior neck mass.   He is here for chronic dysphagia symptoms primarily with solids. He reports no issues with liquids. When he is eating solid foods it would get stuck in his throat. Eventually it all goes down. He has food regurgitation (undigested). Usually bread is hardest for him. Foods with particles and dry foods cause him to cough and choke.  This has been a problem for over 4-5 yrs.  He reports persistent phlegm in the back of his throat.  He also reports chronic nasal congestion and  symptoms of difficulties with breathing through his nose.  He was evaluated by Dr. Ernestene Kiel, and had MBS and esophagram.  He also had CT neck.  I reviewed all imaging and summarized findings in assessment and plan.   She reports longstanding history of a lump along the left anterior neck, that did not bother him, but was noted by one of the physicians who saw him in the past, denies pain, denies significant changes in size of the lump. Of note CT soft tissue neck did  not mention any lymphadenopathy or any evidence of anterior left neck mass that was performed on September 25, 2022.   Records Reviewed:  Dr Harvie Junior note from 11/04/22 Scott Dodson is a 73 y.o. year old male who presents for evaluation of a left neck nodule.  The patient reports a non-painful lump on the left side of his neck, which he first noticed approximately a year ago. The nodule fluctuates in size, occasionally decreasing in size. He denies experiencing fevers, chills, night sweats, or weight loss.  The patient experiences dysphagia, particularly when consuming tablets and solids. He describes a sensation of food becoming lodged in his throat. He has not yet scheduled his swallow study ordered by Gastroenterology. He denies experiencing reflux. Depending on the type of food consumed, he experiences choking and coughing.  He has no history of sinus surgery. He denies significant nasal congestion and generally breathes through his nose without difficulty. He experiences facial pain and pressure during colds. His olfactory function is normal. He experiences persistent throat drainage. He has Flonase available for use. He suffers from numerous allergies.  He smoked a long time ago. He drinks 2 alcoholic beverages per night. He is retired from his main job and now works part-time. He works for [employer].   GI records Dr. Amada Jupiter 08/30/22 Clinical details of this patient's symptoms are in my July 03, 2022 office consult note. About 2 years of chronic solid food dysphagia. [name] reports having noticed a lump in the left side of his neck for the last few weeks.    EGD   The examined esophagus was normal. The scope was                            withdrawn. Dilation was performed with a Maloney                            dilator with no resistance at 52 Fr.                           The stomach was normal.                           The cardia and gastric fundus were normal on                             retroflexion. (Hill grade 1)                           The examined duodenum was normal.                           The larynx could not be well-visualized. Complications:  No immediate complications.   Past Medical History:  Diagnosis Date   Allergy    Asthma    Coronary artery disease    s/p lateral MI 4 /11, s/p Promus DES OM2   Diabetes mellitus without complication (HCC)    Heart murmur    Hyperlipidemia    Hypertension    Myocardial infarct [facility]) 2011    Past Surgical History:  Procedure Laterality Date   ADENOIDECTOMY     CORONARY ARTERY BYPASS GRAFT  2011   CORONARY STENT PLACEMENT     TONSILLECTOMY     TYMPANIC MEMBRANE REPAIR Right 2004   UPPER GASTROINTESTINAL ENDOSCOPY     VASECTOMY      Family History  Problem Relation Age of Onset   Heart disease Mother    Hypertension Mother    Heart disease Father    Hypertension Father    Heart disease Brother    Heart disease Sister    Colon cancer Maternal Grandfather 24   Colon polyps Neg Hx    Esophageal cancer Neg Hx    Stomach cancer Neg Hx    Rectal cancer Neg Hx     Social History:  reports that he quit smoking about 48 years ago. His smoking use included cigarettes. He started smoking about 53 years ago. He has a 2.5 pack-year smoking history. He has never used smokeless tobacco. He reports current alcohol use of about 5.0 standard drinks of alcohol per week. He reports that he does not use drugs.  Allergies: No Known Allergies  Medications: I have reviewed the patient's current medications.  The PMH, PSH, Medications, Allergies, and SH were reviewed and updated.  ROS: Constitutional: Negative for fever, weight loss and weight gain. Cardiovascular: Negative for chest pain and dyspnea on exertion. Respiratory: Is not experiencing shortness of breath at rest. Gastrointestinal: Negative for nausea and vomiting. Neurological: Negative for headaches. Psychiatric: The  patient is not nervous/anxious  Blood pressure (!) 156/80, pulse (!) 53, SpO2 98%.  PHYSICAL EXAM:  Exam: General: Well-developed, well-nourished Respiratory Respiratory effort: Equal inspiration and expiration without stridor Cardiovascular Peripheral Vascular: Warm extremities with equal color/perfusion Eyes: No nystagmus with equal extraocular motion bilaterally Neuro/Psych/Balance: Patient oriented to person, place, and time; Appropriate mood and affect; Gait is intact with no imbalance; Cranial nerves I-XII are intact Head and Face Inspection: Normocephalic and atraumatic without mass or lesion Palpation: Facial skeleton intact without bony stepoffs Salivary Glands: No mass or tenderness Facial Strength: Facial motility symmetric and full bilaterally ENT Pinna: External ear intact and fully developed External canal: Canal is patent with intact skin Tympanic Membrane: Clear and mobile External Nose: No scar or anatomic deformity Internal Nose: Septum is deviated to the left. Bilateral nasal polyps, no purulence. Mucosal edema and erythema present.  Bilateral inferior turbinate hypertrophy.  Lips, Teeth, and gums: Mucosa and teeth intact and viable, missing teeth on exam TMJ: No pain to palpation with full mobility Oral cavity/oropharynx: No erythema or exudate, no lesions present Nasopharynx: No mass or lesion with intact mucosa Neck Neck and Trachea: Midline trachea without mass or lesion. Previously seen palpable 1-2 cm mobile round neck mass along anterior left SCM near the level of thyroid cartilage, no skin changes   Procedure: PROCEDURE NOTE: nasal endoscopy  Preoperative diagnosis: chronic sinusitis symptoms/nasal polyposis   Postoperative diagnosis: same + bilateral nasal polyps   Procedure: Diagnostic nasal endoscopy (16109)  Surgeon: Ashok Croon, M.D.  Anesthesia: Topical lidocaine and Afrin  H&P REVIEW: The patient's history and  physical were reviewed  today prior to procedure. All medications were reviewed and updated as well. Complications: None Condition is stable throughout exam Indications and consent: The patient presents with symptoms of chronic sinusitis not responding to previous therapies. All the risks, benefits, and potential complications were reviewed with the patient preoperatively and informed consent was obtained. The time out was completed with confirmation of the correct procedure.   Procedure: The patient was seated upright in the clinic. Topical lidocaine and Afrin were applied to the nasal cavity. After adequate anesthesia had occurred, the rigid nasal endoscope was passed into the nasal cavity. The nasal mucosa, turbinates, septum, and sinus drainage pathways were visualized bilaterally. This revealed no purulence or significant secretions that might be cultured. There were bilateral polyps and mucosal edema. The mucosa was intact and there was no crusting present. The scope was then slowly withdrawn and the patient tolerated the procedure well. There were no complications or blood loss.  Studies Reviewed: CT soft tissue neck 09/25/2022 EXAM: CT NECK WITH CONTRAST   TECHNIQUE: Multidetector CT imaging of the neck was performed using the standard protocol following the bolus administration of intravenous contrast.   RADIATION DOSE REDUCTION: This exam was performed according to the departmental dose-optimization program which includes automated exposure control, adjustment of the mA and/or kV according to patient size and/or use of iterative reconstruction technique.   CONTRAST:  75mL OMNIPAQUE IOHEXOL 350 MG/ML SOLN   COMPARISON:  None Available.   FINDINGS: Pharynx and larynx: No evidence of a mass or swelling. Widely patent airway. No fluid collection or inflammatory changes in the parapharyngeal or retropharyngeal spaces.   Salivary glands: No inflammation, mass, or stone.   Thyroid: Unremarkable.   Lymph  nodes: No enlarged or suspicious lymph nodes in the neck.   Vascular: Major vascular structures of the neck are patent. Mild atherosclerotic calcification at the carotid bifurcations without evidence of significant stenosis.   Limited intracranial: Unremarkable   Visualized orbits: Unremarkable.   Mastoids and visualized paranasal sinuses: Mucous retention cysts in the maxillary sinuses. Clear mastoid air cells.   Skeleton: Bulky anterior vertebral ossification throughout the cervical spine. No suspicious osseous lesion.   Upper chest: Clear lung apices.   Other: None.   IMPRESSION: No neck mass, lymphadenopathy, or acute abnormality identified.  Swallow study MBS and esophagram 02/27/23 Esophagram FINDINGS: Swallowing: Appears normal. No vestibular penetration or aspiration seen. Residual contrast remains in vallecula.   Pharynx: Unremarkable.   Esophagus: Normal appearance.  No mucosal lesions or strictures   Esophageal motility: Within normal limits.   Hiatal Hernia: None.   Gastroesophageal reflux: None visualized even with provocative maneuvers such as cough and water siphon test.   Ingested 13 mm barium tablet: Tablet was given during modified barium swallow. See additional report for findings.   IMPRESSION: No evidence of aspiration.   Negative esophagram.  MBS 02/27/23 HPI/PMH: HPI: Pt with c/o "difficulty swallowing pills"; esophagram completed prior to MBS.  Pt denies any neurological hx of CVA, GERD or PNA related to swallowing.     Clinical Impression: Clinical Impression: Pt presents with mild pharyngoesophageal dysphagia c/b brief trace vestibular penetration (PAS 2) with small amounts of thin liquids/nectar thick liquids d/t partial superior laryngeal movement and decreased anterior movement paired with partial epiglottic inversion.  Pt able to consume larger volumes of thin without this occurrence; although vallecular/pyriform sinus retention noted  d/t partial esophageal obstruction of flow.  Pt utilized repetitive swallows, liquid wash and effortful swallow to clear  vallecular space and pyriform sinuses/posterior pharyngeal wall residue with good results.  Pt unable to swallow 13 mm tablet despite use of compensatory strategies, so pill was regurgitated into oral cavity and removed.  No aspiration noted with any consistency.  Recommend regular/thin liquid diet with utilization of strategies listed above.  Pt given esophageal precautions and strategies utilized during study to implement during meals.  Pt agreeable and appreciative.  If symptoms persist, may consider short-term ST for pharyngoesophageal symptoms prn.  Thank you for this consult.      Assessment/Plan: Encounter Diagnoses  Name Primary?   Nasal polyps Yes   Chronic sinusitis, unspecified location    Nasal septal deviation    Hypertrophy of both inferior nasal turbinates    Chronic nasal congestion    Post-nasal drainage    Mild intermittent asthma without complication       Chronic nasal congestion and nasal obstruction, evidence of nasal polyps b/l on nasal endoscopy today with post-nasal drainage and mucosal edema, concern for chronic sinusitis. Will initiate medical management below and obtain CT sinuses  - I discussed with the patient that his sx are likely 2/2 undiagnosed chronic environmental allergies  - CBC with diff + total IgE to complete workup for CRSwNP -I reviewed CT neck he had back in March of this year and there is evidence of mucosal thickening bilaterally maxillary sinuses concerning for chronic sinus disease - nasal saline rinses  2. Anterior neck lump - palpable round left anterior neck mass, ~ 1-2 cm in size, along left mid SCM border on exam, I reviewed CT neck he had back in March and there was no evidence of lymphadenopathy or suspicious masses back then - neck soft tissue U/S to better evaluate -Will consider repeat CT neck if  needed -Differential diagnosis includes cervical lymph node versus lipoma versus other pathology -he does have a remote history of smoking  3.  Chronic dysphagia -patient with longstanding chronic symptoms of dysphagia limited to solids and pills, I reviewed results of his recent modified barium swallow and esophagram which showed mild pharyngoesophageal dysphagia per SLP report no aspiration, images of the swallow study also demonstrate a prominent osteophyte which I suspect could be one of the reasons for dysphagia symptoms, although esophagram is somewhat limited in the segment of UES, I did not appreciate prominent CP bar.  -Flexible laryngoscopy portion of the exam today with evidence of minimal secretions along hypopharynx, bilateral vocal folds were mobile, mildly atrophic, there was evidence of moderate postcricoid edema/pachydermia -I discussed exam findings with the patient and we will plan to consider swallow therapy in the future as well as a consultation with spine surgery -At this time he does not have weight loss or any evidence of aspiration, although could benefit from swallow therapy and if candidate for osteophyte removal, surgical management of cervical osteophyte could improve his symptoms. -Will further discuss when he returns after imaging  4. GERD LPR -evidence of findings consistent with GERD LPR on flexible laryngoscopy today which could also contribute to sensation of phlegm and lump in his throat and dysphagia symptoms - Famotidine 20 mg BID  - reflux gourmet trial -diet lifestyle changes to minimize reflux    - RTC after testing   Update 04/28/23 CT sinuses with evidence of mucus retention cysts in maxillary sinuses, otherwise no evidence of CRS, also had evidence of septal deviation and ITH and what appears like nasal polyps along b/l nasal passages, which is c/w with his nasal endoscopy.  Chronic nasal congestion/obstruction and evidence of nasal polyps and NSD/ITH  on CT sinuses and repeat nasal endoscopy today  - CT findings c/w NSD/ITH and b/l nasal polyps - will use for image guidance if he elects to proceed with surgery  - he did not complete labs (CBC/diff and total IgE) - we discussed management of chronic nasal congestion and risks and benefits of surgical removal of nasal polyps/septoplasty/ITR and he is interested - will refer to Allergy for Dupixent consultation to prevent polyp recurrence  - start nasal saline/steroid rinses - Rx sent for mometasone (will stop Flonase) - continue Clarinex 5 mg daily   Chronic dysphagia - on regular/thin liquid diet right now - patient with longstanding chronic symptoms of dysphagia limited to solids and pills, I reviewed results of his recent modified barium swallow and esophagram which showed mild pharyngoesophageal dysphagia per SLP report no aspiration, images of the swallow study also demonstrate a prominent osteophyte which I suspect could be one of the reasons for dysphagia symptoms, although esophagram is somewhat limited in the segment of UES, I did not appreciate prominent CP bar.  -Flexible laryngoscopy during the initial consultation with evidence of minimal secretions along hypopharynx, bilateral vocal folds were mobile, mildly atrophic, there was evidence of moderate postcricoid edema/pachydermia - will consider consulting Spine Surgery if dysphagia sx worsen (2/2 evidence of cervical osteophyte - will proceed with swallow therapy referral   3. Neck lipoma  - neck U/S with evidence of 2.6 cm subcutaneous nodule (same density as surrounding fat) c/w likely lipoma - he denies any recent changes in size, pain or numbness/sx from the mass, has had it for a long time - will observe and repeat imaging if grows over time or if he develops sx - had flexible scope without evidence of mass/tumor during his last office visit   4. Hx of environmental allergies  - schedule Allergy consultation and medical  management as above  5. Hx of smoking, quit several years ago, reports an episode of hypoxia in the setting of URI, requiring admission to the hospital for a few days   - Pulmonary consultation to rule out lung disease such as reactive airway disease or COPD   6. Chronic GERD LPR - continue Famotidine 20 mg daily (refill sent) and reflux gourmet   Update 08/04/23 Assessment and Plan    CRS with Bilateral Nasal Polyps Chronic bilateral nasal polyps causing  chronic nasal congestion and anosmia. Current treatment with nasal steroid rinses has provided some symptom relief. Persistent polyps on nasal endoscopy today. Discussed surgery versus medical management with Dupixent. Surgery aims to improve nasal airflow and facilitate better rinsing/remove nasal polyps. Dupixent is for preventing polyp regrowth. The patient opted for surgical removal with post-op Dupixent. - Schedule for b/l nasal endoscopy with polyp removal, possible bilateral maxillary antrostomy possible septoplasty and inferior turbinate reduction - Fax cardiology clearance request  - already seen and had repeat Echo, which appears without major changes - Continue nasal steroid rinses  Chronic nasal congestion and environmental allergies Dupixent recommended by allergy specialist to prevent polyp regrowth. Patient has arranged a payment plan with insurance.  - Continue current allergy management - proceed with Dupixent therapy - continue nasal steroid rinses - continue daily Clarinex 5 mg   Follow-up - Await call from surgery scheduling team - Follow up with cardiologist for surgical clearance.     Ashok Croon, MD Otolaryngology [facility] Phone: [phone] Fax: [phone]    08/05/2023, 6:55 AM

## 2023-08-05 ENCOUNTER — Telehealth (INDEPENDENT_AMBULATORY_CARE_PROVIDER_SITE_OTHER): Payer: Self-pay

## 2023-08-05 NOTE — Telephone Encounter (Signed)
Cardiology clearance.

## 2023-08-14 NOTE — Telephone Encounter (Signed)
[facility] are calling to get update on clearance. Please advise.

## 2023-08-15 NOTE — Telephone Encounter (Signed)
   Patient Name: Scott Dodson  DOB: 11-01-1950 MRN: [id number]  Primary Cardiologist: None  Chart reviewed as part of pre-operative protocol coverage. Given past medical history and time since last visit, based on ACC/AHA guidelines, Scott Dodson is at acceptable risk for the planned procedure without further cardiovascular testing.   The patient was advised that if he develops new symptoms prior to surgery to contact our office to arrange for a follow-up visit, and he verbalized understanding.  Regarding ASA therapy, we recommend continuation of ASA throughout the perioperative period.  However, if the surgeon feels that cessation of ASA is required in the perioperative period, it may be stopped 5-7 days prior to surgery with a plan to resume it as soon as felt to be feasible from a surgical standpoint in the post-operative period.   I will route this recommendation to the requesting party via Epic fax function and remove from pre-op pool.  Please call with questions.  Napoleon Form, Leodis Rains, NP 08/15/2023, 8:05 AM

## 2023-08-19 ENCOUNTER — Other Ambulatory Visit: Payer: Self-pay

## 2023-08-19 ENCOUNTER — Encounter (HOSPITAL_COMMUNITY): Payer: Self-pay | Admitting: General Practice

## 2023-08-19 NOTE — Progress Notes (Signed)
 SDW CALL  Patient was given pre-op instructions over the phone. The opportunity was given for the patient to ask questions. No further questions asked. Patient verbalized understanding of instructions given.   PCP - [facility] Cardiologist - Dr. Clifton James - not seen since 2015 but has seen Joni Reining  PPM/ICD - denies Device Orders - n/a Rep Notified - n/a  Chest x-ray -  EKG - 05/19/2023 Stress Test -  ECHO - 06/30/2023 Cardiac Cath - 2011  Sleep Study - denies CPAP - n/a  Fasting Blood Sugar - patient is unsure what is fasting CBG is - he does not check his blood sugar at home   Last dose of GLP1 agonist-  n/a GLP1 instructions: n/a  Blood Thinner Instructions: n/a Aspirin Instructions: n/a  ERAS Protcol - NPO   COVID TEST-  n/a   Anesthesia review: yes  Patient denies shortness of breath, fever, cough and chest pain over the phone call   All instructions explained to the patient, with a verbal understanding of the material. Patient agrees to go over the instructions while at home for a better understanding.

## 2023-08-19 NOTE — Progress Notes (Signed)
 Anesthesia Chart Review:  Case: [id number] Date/Time: 08/20/23 0815   Procedures:      NASAL ENDOSCOPY (Bilateral)     POLYPECTOMY NASAL (Bilateral)     MAXILLARY ANTROSTOMY (Bilateral)     SEPTOPLASTY     TURBINATE REDUCTION (Bilateral)     SINUS ENDO WITH FUSION   Anesthesia type: Choice   Pre-op diagnosis: CHRONIC SINUSITIS, NASAL POLYPS   [facility]: [facility]OM 07 / [facility]   Surgeons: Ashok Croon, MD       DISCUSSION: Patient is a 73 year old male scheduled for the above procedure.  History includes former smoker (quit 10/14/74), CAD (inferolateral STEMI, s/p DES OM2 10/02/09), murmur (no significant valvular disease 06/2023 echo), HTN, HLD, DM2, asthma,  Cardiology follow-up with preoperative evaluation on 05/19/2023 by Joni Reining, DNP.  He denied any cardiac concerns and reported able to walk long distances at his job and climb stairs.  He denied palpitations, near-syncope. He attributed breathing changes related to asthma but denied any significant exertional dyspnea. She wrote, "Preoperative cardiac evaluation: According to the Revised Cardiac Risk Index (RCRI), his Perioperative Risk of Major Cardiac Event is (%): 0.4   His Functional Capacity in METs is: 7.66 according to the Duke Activity Status Index (DASI).    Per office protocol, if patient is without any new symptoms or concerns at the time of their virtual visit, he/she may hold ASA for 7 days prior to procedure. Please resume ASA as soon as possible postprocedure, at the discretion of the surgeon.     However due to to history of CAD hypertension and diabetes, I am going to repeat his echocardiogram for any changes in LV function prior to clearing for surgery.  If echocardiogram is normal we will release him to move forward with his planned procedure."  Echo 06/30/23 showed LVEF 50 to 55%, posterior wall hypokinesis (inferoposterior hypokinesis known post 2011 MI), grade 1 diastolic dysfunction, normal RV systolic  function, mildly elevated PASP, no significant valvular abnormalities.  No additional testing ordered.  Plan for him to get reestablished with Dr. Verne Carrow in the next 12 to 18 months.  A1c 6.6 on 05/26/2023.  Currently on metformin and glipizide.  He is no longer taking [place] due to cost.  Anesthesia team to evaluate on the day of surgery.    VS:  Wt Readings from Last 3 Encounters:  06/09/23 89.1 kg  05/26/23 88.5 kg  05/19/23 88.9 kg   BP Readings from Last 3 Encounters:  08/04/23 (!) 156/80  06/09/23 118/62  05/26/23 124/70   Pulse Readings from Last 3 Encounters:  08/04/23 (!) 53  06/09/23 (!) 52  05/26/23 (!) 55     PROVIDERS: Georgina Quint, MD is PCP Wyline Mood, DO is allergist   LABS: For day of surgery as indicated. Last results in [facility] include: Lab Results  Component Value Date   WBC 4.8 05/26/2023   HGB 16.2 05/26/2023   HCT 48.7 05/26/2023   PLT 150.0 05/26/2023   GLUCOSE 133 (H) 05/26/2023   CHOL 133 05/26/2023   TRIG 105.0 05/26/2023   HDL 51.00 05/26/2023   LDLDIRECT 77.0 05/21/2022   LDLCALC 61 05/26/2023   ALT 12 05/26/2023   AST 15 05/26/2023   NA 144 05/26/2023   K 4.2 05/26/2023   CL 106 05/26/2023   CREATININE 1.04 05/26/2023   BUN 17 05/26/2023   CO2 26 05/26/2023   HGBA1C 6.6 (H) 05/26/2023   MICROALBUR 1.1 05/26/2023    Spirometry 06/09/23:  FVC: 2.73L FEV1: 1.79L, 59% predicted FEV1/FVC ratio: 66% Interpretation: Spirometry consistent with possible restrictive disease with 11% improvement in FEV1 post bronchodilator treatment. Clinically feeling unchanged.    IMAGES: CT Sinus 03/31/23: MPRESSION: 1. Numerous retention cysts and/or polyps along the floors of both maxillary sinuses. No layering fluid. Mild intervening mucosal thickening. 2. Scattered opacified anterior ethmoid air cells, left more than right. Mucosal thickening at the frontal ethmoid junction on the left. 3. Very small amount of fluid  layering in the right division of the sphenoid sinus. No widespread mucosal thickening. 4. Nasal septum bows 4 mm towards the right with a right spur. Question several small scattered sessile mucosal polyps.    EKG: 05/19/23: Sinus bradycardia at 51 bpm Anteroseptal infarct , age undetermined When compared with ECG of 28-Jun-2022 12:23, PREVIOUS ECG IS PRESENT Confirmed by Joni Reining [phone]) on 05/19/2023 9:23:10 AM   CV: Echo 06/30/23: IMPRESSIONS   1. Left ventricular ejection fraction, by estimation, is 50 to 55%. The  left ventricle has low normal function. The left ventricle demonstrates  regional wall motion abnormalities (see scoring diagram/findings for  description). Left ventricular diastolic   parameters are consistent with Grade I diastolic dysfunction (impaired  relaxation). Elevated left ventricular end-diastolic pressure.  LV Wall Scoring:  The posterior wall is hypokinetic. The entire anterior wall,  antero-lateral  wall, entire septum, entire apex, and entire inferior wall are normal.   2. Right ventricular systolic function is normal. The right ventricular  size is normal. There is mildly elevated pulmonary artery systolic  pressure.   3. The mitral valve is normal in structure. No evidence of mitral valve  regurgitation. No evidence of mitral stenosis.   4. The aortic valve is tricuspid. Aortic valve regurgitation is not  visualized. No aortic stenosis is present.   5. The inferior vena cava is normal in size with greater than 50%  respiratory variability, suggesting right atrial pressure of 3 mmHg.  - Comparison 10/02/09: V EF 40 to 45%, mild LVH, severe hypokinesis in the basal-mid inferoposterior myocardium, grade 1 diastolic dysfunction.   Cardiac cath 10/02/09:  Inferolateral STEMI secondary to total occlusion of the second obtuse marginal branch. Successful primary percutaneous coronary intervention with a long drug-eluting stent. Severe right  coronary artery stenosis (small, nondominant vessel). Nonobstructive left anterior descending stenosis.  Mild segmental left ventricular systolic dysfunction with elevated left ventricular end-diastolic pressure.  Past Medical History:  Diagnosis Date   Allergy    Asthma    Coronary artery disease    s/p lateral MI 4 /11, s/p Promus DES OM2   Diabetes mellitus without complication (HCC)    Heart murmur    Hyperlipidemia    Hypertension    Myocardial infarct [facility]) 2011    Past Surgical History:  Procedure Laterality Date   ADENOIDECTOMY     CORONARY ARTERY BYPASS GRAFT  2011   CORONARY STENT PLACEMENT     TONSILLECTOMY     TYMPANIC MEMBRANE REPAIR Right 2004   UPPER GASTROINTESTINAL ENDOSCOPY     VASECTOMY      MEDICATIONS: No current facility-administered medications for this encounter.    albuterol (VENTOLIN HFA) 108 (90 Base) MCG/ACT inhaler   amLODipine (NORVASC) 5 MG tablet   Ascorbic Acid (VITAMIN C) 1000 MG tablet   aspirin 81 MG tablet   budesonide (PULMICORT) 0.5 MG/2ML nebulizer solution   carvedilol (COREG) 25 MG tablet   cetirizine (ZYRTEC) 10 MG tablet   Coenzyme Q10 (CO Q 10 PO)  famotidine (PEPCID) 20 MG tablet   fluticasone (FLONASE) 50 MCG/ACT nasal spray   fluticasone furoate-vilanterol (BREO ELLIPTA) 100-25 MCG/ACT AEPB   glipiZIDE (GLUCOTROL) 5 MG tablet   lisinopril (ZESTRIL) 40 MG tablet   metFORMIN (GLUCOPHAGE) 500 MG tablet   rosuvastatin (CRESTOR) 20 MG tablet   FARXIGA 10 MG TABS tablet    Shonna Chock, PA-C Surgical Short Stay/Anesthesiology [facility] Phone [phone] [facility] Phone [phone] 08/19/2023 3:37 PM

## 2023-08-19 NOTE — Anesthesia Preprocedure Evaluation (Signed)
 Anesthesia Evaluation  Patient identified by MRN, date of birth, ID band Patient awake    Reviewed: Allergy & Precautions, NPO status , Patient's Chart, lab work & pertinent test results  History of Anesthesia Complications Negative for: history of anesthetic complications  Airway Mallampati: II  TM Distance: >3 FB Neck ROM: Full    Dental  (+) Dental Advisory Given, Teeth Intact,    Pulmonary neg shortness of breath, asthma , neg sleep apnea, neg recent URI, former smoker   breath sounds clear to auscultation       Cardiovascular hypertension, Pt. on medications (-) angina + CAD, + Past MI and + Cardiac Stents   Rhythm:Regular      ECHOCARDIOGRAM REPORT       Patient Name:   Scott Dodson Date of Exam: 06/30/2023 Medical Rec #:  [id number]     Height:       68.5 in Accession #:    [id number]    Weight:       196.4 lb Date of Birth:  1951/06/11     BSA:          2.040 m Patient Age:    72 years      BP:           137/78 mmHg Patient Gender: M             HR:           64 bpm. Exam Location:  [address]  Procedure: 2D Echo, 3D Echo, Cardiac Doppler and Color Doppler  Indications:    Z01.810 Pre-operative cardiovascular examination   History:        Patient has prior history of Echocardiogram examinations, most                 recent 10/04/2009. Previous Myocardial Infarction and CAD; Risk                 Factors:Hypertension, Diabetes and HLD.   Sonographer:    Clearence Ped RCS Referring Phys: [id number] [place]  IMPRESSIONS    1. Left ventricular ejection fraction, by estimation, is 50 to 55%. The left ventricle has low normal function. The left ventricle demonstrates regional wall motion abnormalities (see scoring diagram/findings for description). Left ventricular diastolic  parameters are consistent with Grade I diastolic dysfunction (impaired relaxation). Elevated left ventricular end-diastolic  pressure.  2. Right ventricular systolic function is normal. The right ventricular size is normal. There is mildly elevated pulmonary artery systolic pressure.  3. The mitral valve is normal in structure. No evidence of mitral valve regurgitation. No evidence of mitral stenosis.  4. The aortic valve is tricuspid. Aortic valve regurgitation is not visualized. No aortic stenosis is present.  5. The inferior vena cava is normal in size with greater than 50% respiratory variability, suggesting right atrial pressure of 3 mmHg.     Neuro/Psych negative neurological ROS  negative psych ROS   GI/Hepatic negative GI ROS, Neg liver ROS,,,  Endo/Other  diabetes  Lab Results      Component                Value               Date                      HGBA1C                   6.6 (H)  05/26/2023             Renal/GU negative Renal ROS     Musculoskeletal negative musculoskeletal ROS (+)    Abdominal   Peds  Hematology negative hematology ROS (+) Lab Results      Component                Value               Date                      WBC                      4.8                 05/26/2023                HGB                      16.2                05/26/2023                HCT                      48.7                05/26/2023                MCV                      88.2                05/26/2023                PLT                      150.0               05/26/2023              Anesthesia Other Findings   Reproductive/Obstetrics                              Anesthesia Physical Anesthesia Plan  ASA: 2  Anesthesia Plan: General   Post-op Pain Management: Ofirmev IV (intra-op)*   Induction: Intravenous  PONV Risk Score and Plan: 2 and Ondansetron and Dexamethasone  Airway Management Planned: Oral ETT  Additional Equipment: None  Intra-op Plan:   Post-operative Plan: Extubation in OR  Informed Consent: I have reviewed the patients  History and Physical, chart, labs and discussed the procedure including the risks, benefits and alternatives for the proposed anesthesia with the patient or authorized representative who has indicated his/her understanding and acceptance.     Dental advisory given  Plan Discussed with: CRNA  Anesthesia Plan Comments: (PAT note written 08/19/2023 by Shonna Chock, PA-C.  )        Anesthesia Quick Evaluation

## 2023-08-20 ENCOUNTER — Other Ambulatory Visit: Payer: Self-pay

## 2023-08-20 ENCOUNTER — Encounter (HOSPITAL_COMMUNITY)
Admission: RE | Disposition: A | Discharge status: Home / Self Care | Payer: Self-pay | Source: Home / Self Care | Attending: Otolaryngology

## 2023-08-20 ENCOUNTER — Ambulatory Visit (HOSPITAL_BASED_OUTPATIENT_CLINIC_OR_DEPARTMENT_OTHER): Payer: Medicare Other | Admitting: Vascular Surgery

## 2023-08-20 ENCOUNTER — Ambulatory Visit (HOSPITAL_COMMUNITY): Payer: Medicare Other | Admitting: Vascular Surgery

## 2023-08-20 ENCOUNTER — Ambulatory Visit (HOSPITAL_COMMUNITY)
Admission: RE | Admit: 2023-08-20 | Discharge: 2023-08-20 | Disposition: A | Discharge status: Home / Self Care | Payer: Medicare Other | Attending: Otolaryngology | Admitting: Otolaryngology

## 2023-08-20 ENCOUNTER — Encounter (HOSPITAL_COMMUNITY): Payer: Self-pay | Admitting: *Deleted

## 2023-08-20 DIAGNOSIS — J45909 Unspecified asthma, uncomplicated: Secondary | ICD-10-CM | POA: Diagnosis not present

## 2023-08-20 DIAGNOSIS — J329 Chronic sinusitis, unspecified: Secondary | ICD-10-CM | POA: Insufficient documentation

## 2023-08-20 DIAGNOSIS — R0981 Nasal congestion: Secondary | ICD-10-CM | POA: Diagnosis not present

## 2023-08-20 DIAGNOSIS — E119 Type 2 diabetes mellitus without complications: Secondary | ICD-10-CM | POA: Insufficient documentation

## 2023-08-20 DIAGNOSIS — J339 Nasal polyp, unspecified: Secondary | ICD-10-CM

## 2023-08-20 DIAGNOSIS — I2510 Atherosclerotic heart disease of native coronary artery without angina pectoris: Secondary | ICD-10-CM | POA: Insufficient documentation

## 2023-08-20 DIAGNOSIS — J4520 Mild intermittent asthma, uncomplicated: Secondary | ICD-10-CM | POA: Insufficient documentation

## 2023-08-20 DIAGNOSIS — J343 Hypertrophy of nasal turbinates: Secondary | ICD-10-CM | POA: Diagnosis not present

## 2023-08-20 DIAGNOSIS — I10 Essential (primary) hypertension: Secondary | ICD-10-CM | POA: Diagnosis not present

## 2023-08-20 DIAGNOSIS — E785 Hyperlipidemia, unspecified: Secondary | ICD-10-CM | POA: Insufficient documentation

## 2023-08-20 DIAGNOSIS — Z955 Presence of coronary angioplasty implant and graft: Secondary | ICD-10-CM | POA: Insufficient documentation

## 2023-08-20 DIAGNOSIS — Z7984 Long term (current) use of oral hypoglycemic drugs: Secondary | ICD-10-CM | POA: Insufficient documentation

## 2023-08-20 DIAGNOSIS — I252 Old myocardial infarction: Secondary | ICD-10-CM | POA: Insufficient documentation

## 2023-08-20 DIAGNOSIS — J341 Cyst and mucocele of nose and nasal sinus: Secondary | ICD-10-CM | POA: Diagnosis not present

## 2023-08-20 DIAGNOSIS — Z79899 Other long term (current) drug therapy: Secondary | ICD-10-CM | POA: Insufficient documentation

## 2023-08-20 DIAGNOSIS — J3489 Other specified disorders of nose and nasal sinuses: Secondary | ICD-10-CM | POA: Diagnosis not present

## 2023-08-20 DIAGNOSIS — J342 Deviated nasal septum: Secondary | ICD-10-CM

## 2023-08-20 DIAGNOSIS — Z87891 Personal history of nicotine dependence: Secondary | ICD-10-CM | POA: Insufficient documentation

## 2023-08-20 HISTORY — PX: NASAL ENDOSCOPY: SHX6577

## 2023-08-20 HISTORY — PX: SEPTOPLASTY: SHX2393

## 2023-08-20 HISTORY — PX: POLYPECTOMY: SHX149

## 2023-08-20 HISTORY — PX: MAXILLARY ANTROSTOMY: SHX2003

## 2023-08-20 HISTORY — DX: Personal history of pneumonia (recurrent): Z87.01

## 2023-08-20 HISTORY — PX: TURBINATE REDUCTION: SHX6157

## 2023-08-20 HISTORY — PX: SINUS ENDO WITH FUSION: SHX5329

## 2023-08-20 LAB — GLUCOSE, CAPILLARY: Glucose-Capillary: 163 mg/dL — ABNORMAL HIGH (ref 70–99)

## 2023-08-20 SURGERY — ENDOSCOPY, NOSE
Anesthesia: General | Site: Nose

## 2023-08-20 MED ORDER — FENTANYL CITRATE (PF) 250 MCG/5ML IJ SOLN
INTRAMUSCULAR | Status: DC | PRN
Start: 2023-08-20 — End: 2023-08-20
  Administered 2023-08-20: 50 ug via INTRAVENOUS
  Administered 2023-08-20: 100 ug via INTRAVENOUS
  Administered 2023-08-20 (×2): 50 ug via INTRAVENOUS

## 2023-08-20 MED ORDER — SODIUM CHLORIDE 0.9 % IV SOLN
3.0000 g | INTRAVENOUS | Status: AC
  Administered 2023-08-20: 3 g via INTRAVENOUS
  Filled 2023-08-20: qty 8

## 2023-08-20 MED ORDER — PROPOFOL 10 MG/ML IV BOLUS
INTRAVENOUS | Status: AC
  Filled 2023-08-20: qty 20

## 2023-08-20 MED ORDER — FENTANYL CITRATE (PF) 100 MCG/2ML IJ SOLN: 25.0000 ug | INTRAMUSCULAR | Status: DC | PRN

## 2023-08-20 MED ORDER — ACETAMINOPHEN 10 MG/ML IV SOLN: 1000.0000 mg | Freq: Once | INTRAVENOUS | Status: DC | PRN

## 2023-08-20 MED ORDER — DEXAMETHASONE SODIUM PHOSPHATE 10 MG/ML IJ SOLN
INTRAMUSCULAR | Status: AC
Start: 2023-08-20 — End: ?
  Filled 2023-08-20: qty 1

## 2023-08-20 MED ORDER — BACITRACIN ZINC 500 UNIT/GM EX OINT
TOPICAL_OINTMENT | CUTANEOUS | Status: AC
  Filled 2023-08-20: qty 28.35

## 2023-08-20 MED ORDER — SUGAMMADEX SODIUM 200 MG/2ML IV SOLN
INTRAVENOUS | Status: AC
  Filled 2023-08-20: qty 2

## 2023-08-20 MED ORDER — LIDOCAINE 2% (20 MG/ML) 5 ML SYRINGE
INTRAMUSCULAR | Status: DC | PRN
  Administered 2023-08-20: 60 mg via INTRAVENOUS

## 2023-08-20 MED ORDER — 0.9 % SODIUM CHLORIDE (POUR BTL) OPTIME
TOPICAL | Status: DC | PRN
  Administered 2023-08-20: 1000 mL

## 2023-08-20 MED ORDER — OXYMETAZOLINE HCL 0.05 % NA SOLN
NASAL | Status: AC
  Filled 2023-08-20: qty 30

## 2023-08-20 MED ORDER — OXYCODONE HCL 5 MG/5ML PO SOLN: 5.0000 mg | Freq: Once | ORAL | Status: DC | PRN

## 2023-08-20 MED ORDER — OXYMETAZOLINE HCL 0.05 % NA SOLN
NASAL | Status: DC | PRN
  Administered 2023-08-20: 1 via TOPICAL

## 2023-08-20 MED ORDER — ACETAMINOPHEN 500 MG PO TABS: 1000.0000 mg | ORAL_TABLET | Freq: Once | ORAL | Status: DC | PRN

## 2023-08-20 MED ORDER — HEMOSTATIC AGENTS (NO CHARGE) OPTIME
TOPICAL | Status: DC | PRN
  Administered 2023-08-20: 1

## 2023-08-20 MED ORDER — ORAL CARE MOUTH RINSE: 15.0000 mL | Freq: Once | OROMUCOSAL | Status: AC

## 2023-08-20 MED ORDER — INSULIN ASPART 100 UNIT/ML IJ SOLN
INTRAMUSCULAR | Status: AC
  Filled 2023-08-20: qty 1

## 2023-08-20 MED ORDER — ACETAMINOPHEN 160 MG/5ML PO SOLN: 1000.0000 mg | Freq: Once | ORAL | Status: DC | PRN

## 2023-08-20 MED ORDER — DEXAMETHASONE SODIUM PHOSPHATE 10 MG/ML IJ SOLN
INTRAMUSCULAR | Status: DC | PRN
  Administered 2023-08-20: 10 mg via INTRAVENOUS

## 2023-08-20 MED ORDER — ONDANSETRON HCL 4 MG/2ML IJ SOLN
INTRAMUSCULAR | Status: AC
Start: 2023-08-20 — End: ?
  Filled 2023-08-20: qty 2

## 2023-08-20 MED ORDER — FENTANYL CITRATE (PF) 250 MCG/5ML IJ SOLN
INTRAMUSCULAR | Status: AC
  Filled 2023-08-20: qty 5

## 2023-08-20 MED ORDER — SUGAMMADEX SODIUM 200 MG/2ML IV SOLN
INTRAVENOUS | Status: DC | PRN
  Administered 2023-08-20: 200 mg via INTRAVENOUS

## 2023-08-20 MED ORDER — EPHEDRINE SULFATE-NACL 50-0.9 MG/10ML-% IV SOSY
PREFILLED_SYRINGE | INTRAVENOUS | Status: DC | PRN
Start: 2023-08-20 — End: 2023-08-20
  Administered 2023-08-20 (×2): 5 mg via INTRAVENOUS

## 2023-08-20 MED ORDER — EPHEDRINE 5 MG/ML INJ
INTRAVENOUS | Status: AC
  Filled 2023-08-20: qty 5

## 2023-08-20 MED ORDER — AMOXICILLIN-POT CLAVULANATE 875-125 MG PO TABS: 1.0000 | ORAL_TABLET | Freq: Two times a day (BID) | ORAL | 0 refills | Status: AC

## 2023-08-20 MED ORDER — CHLORHEXIDINE GLUCONATE 0.12 % MT SOLN: 15.0000 mL | Freq: Once | OROMUCOSAL | Status: AC

## 2023-08-20 MED ORDER — SODIUM CHLORIDE 0.9 % IR SOLN
Status: DC | PRN
  Administered 2023-08-20: 1000 mL

## 2023-08-20 MED ORDER — METHYLPREDNISOLONE 4 MG PO TBPK: ORAL_TABLET | ORAL | 1 refills | Status: DC

## 2023-08-20 MED ORDER — ROCURONIUM BROMIDE 10 MG/ML (PF) SYRINGE
PREFILLED_SYRINGE | INTRAVENOUS | Status: DC | PRN
  Administered 2023-08-20: 70 mg via INTRAVENOUS

## 2023-08-20 MED ORDER — LACTATED RINGERS IV SOLN
INTRAVENOUS | Status: DC
Start: 2023-08-20 — End: 2023-08-20

## 2023-08-20 MED ORDER — OXYCODONE HCL 5 MG PO TABS: 5.0000 mg | ORAL_TABLET | Freq: Four times a day (QID) | ORAL | 0 refills | Status: AC | PRN

## 2023-08-20 MED ORDER — PROPOFOL 10 MG/ML IV BOLUS
INTRAVENOUS | Status: DC | PRN
  Administered 2023-08-20: 30 mg via INTRAVENOUS
  Administered 2023-08-20: 130 mg via INTRAVENOUS

## 2023-08-20 MED ORDER — LIDOCAINE-EPINEPHRINE 1 %-1:100000 IJ SOLN
INTRAMUSCULAR | Status: AC
  Filled 2023-08-20: qty 1

## 2023-08-20 MED ORDER — ACETAMINOPHEN 500 MG PO TABS: 500.0000 mg | ORAL_TABLET | Freq: Four times a day (QID) | ORAL | 0 refills | Status: AC

## 2023-08-20 MED ORDER — LIDOCAINE 2% (20 MG/ML) 5 ML SYRINGE
INTRAMUSCULAR | Status: AC
  Filled 2023-08-20: qty 5

## 2023-08-20 MED ORDER — CHLORHEXIDINE GLUCONATE 0.12 % MT SOLN
OROMUCOSAL | Status: AC
  Administered 2023-08-20: 15 mL via OROMUCOSAL
  Filled 2023-08-20: qty 15

## 2023-08-20 MED ORDER — INSULIN ASPART 100 UNIT/ML IJ SOLN: 0.0000 [IU] | INTRAMUSCULAR | Status: DC | PRN

## 2023-08-20 MED ORDER — ONDANSETRON HCL 4 MG/2ML IJ SOLN
INTRAMUSCULAR | Status: DC | PRN
  Administered 2023-08-20: 4 mg via INTRAVENOUS

## 2023-08-20 MED ORDER — ROCURONIUM BROMIDE 10 MG/ML (PF) SYRINGE
PREFILLED_SYRINGE | INTRAVENOUS | Status: AC
  Filled 2023-08-20: qty 10

## 2023-08-20 MED ORDER — OXYCODONE HCL 5 MG PO TABS: 5.0000 mg | ORAL_TABLET | Freq: Once | ORAL | Status: DC | PRN

## 2023-08-20 MED ORDER — LIDOCAINE-EPINEPHRINE 1 %-1:100000 IJ SOLN
INTRAMUSCULAR | Status: DC | PRN
  Administered 2023-08-20: 20 mL

## 2023-08-20 SURGICAL SUPPLY — 57 items
ANTIFOG SOL W/FOAM PAD STRL (MISCELLANEOUS) ×2 IMPLANT
ATTRACTOMAT 16X20 MAGNETIC DRP (DRAPES) IMPLANT
BAG COUNTER SPONGE SURGICOUNT (BAG) ×2 IMPLANT
BALLN FRONTAL NUVENT 6X17 (BALLOONS) IMPLANT
BALLOON FRONTAL NUVENT 6X17 (BALLOONS) IMPLANT
BLADE INF TURB ROT M4 2 5PK (BLADE) ×2 IMPLANT
BLADE NAVIG QUADCUT 4.3X13 M4 (BLADE) ×2 IMPLANT
BLADE ROTATE RAD 40 4 M4 (BLADE) IMPLANT
BLADE SURG 15 STRL LF DISP TIS (BLADE) IMPLANT
CANISTER SUCT 3000ML PPV (MISCELLANEOUS) ×4 IMPLANT
COAGULATOR SUCT 8FR VV (MISCELLANEOUS) IMPLANT
COAGULATOR SUCT SWTCH 10FR 6 (ELECTROSURGICAL) IMPLANT
DRAPE HALF SHEET 40X57 (DRAPES) IMPLANT
DRSG NASOPORE 8CM (GAUZE/BANDAGES/DRESSINGS) IMPLANT
ELECT COATED BLADE 2.86 ST (ELECTRODE) IMPLANT
ELECT REM PT RETURN 9FT ADLT (ELECTROSURGICAL) ×2 IMPLANT
ELECTRODE REM PT RTRN 9FT ADLT (ELECTROSURGICAL) ×2 IMPLANT
FILTER ARTHROSCOPY CONVERTOR (FILTER) ×2 IMPLANT
GAUZE SPONGE 2X2 8PLY STRL LF (GAUZE/BANDAGES/DRESSINGS) ×2 IMPLANT
GLOVE BIO SURGEON STRL SZ 6 (GLOVE) ×2 IMPLANT
GLOVE BIO SURGEON STRL SZ 6.5 (GLOVE) ×2 IMPLANT
GOWN STRL REUS W/ TWL LRG LVL3 (GOWN DISPOSABLE) ×4 IMPLANT
HEMOSTAT ARISTA ABSORB 3G PWDR (HEMOSTASIS) IMPLANT
INFLATOR BALLOON W/TUBE (BALLOONS) IMPLANT
KIT BASIN OR (CUSTOM PROCEDURE TRAY) ×2 IMPLANT
KIT TURNOVER KIT B (KITS) ×2 IMPLANT
NDL HYPO 25GX1X1/2 BEV (NEEDLE) ×4 IMPLANT
NDL SPNL 25GX3.5 QUINCKE BL (NEEDLE) ×2 IMPLANT
NEEDLE HYPO 25GX1X1/2 BEV (NEEDLE) ×4 IMPLANT
NEEDLE SPNL 25GX3.5 QUINCKE BL (NEEDLE) ×2 IMPLANT
NS IRRIG 1000ML POUR BTL (IV SOLUTION) ×2 IMPLANT
PAD ARMBOARD 7.5X6 YLW CONV (MISCELLANEOUS) ×4 IMPLANT
PATTIES SURGICAL .5 X3 (DISPOSABLE) ×2 IMPLANT
PENCIL SMOKE EVACUATOR (MISCELLANEOUS) IMPLANT
SHEATH ENDOSCRUB 0 DEG (SHEATH) IMPLANT
SHEATH ENDOSCRUB 30 DEG (SHEATH) IMPLANT
SOLUTION ANTFG W/FOAM PAD STRL (MISCELLANEOUS) ×2 IMPLANT
SPLINT NASAL DOYLE BI-VL (GAUZE/BANDAGES/DRESSINGS) ×2 IMPLANT
SPONGE SURGIFOAM ABS GEL 100 (HEMOSTASIS) IMPLANT
SPONGE SURGIFOAM ABS GEL SZ50 (HEMOSTASIS) ×2 IMPLANT
SUT CHROMIC 4 0 P 3 18 (SUTURE) ×2 IMPLANT
SUT CHROMIC 4 0 RB 1X27 (SUTURE) IMPLANT
SUT PLAIN 4 0 ~~LOC~~ 1 (SUTURE) ×2 IMPLANT
SUT PROLENE 2 0 SH 30 (SUTURE) ×2 IMPLANT
SUT SILK 2 0 SH (SUTURE) ×2 IMPLANT
SWAB COLLECTION DEVICE MRSA (MISCELLANEOUS) IMPLANT
SWAB CULTURE ESWAB REG 1ML (MISCELLANEOUS) IMPLANT
SYR CONTROL 10ML LL (SYRINGE) IMPLANT
SYR TB 1ML LUER SLIP (SYRINGE) ×4 IMPLANT
TOWEL GREEN STERILE FF (TOWEL DISPOSABLE) ×2 IMPLANT
TRACKER ENT INSTRUMENT (MISCELLANEOUS) ×2 IMPLANT
TRACKER ENT PATIENT (MISCELLANEOUS) ×2 IMPLANT
TRAY ENT MC OR (CUSTOM PROCEDURE TRAY) ×2 IMPLANT
TUBE CONNECTING 12X1/4 (SUCTIONS) ×2 IMPLANT
TUBE SALEM SUMP 16F (TUBING) ×2 IMPLANT
TUBING EXTENTION W/L.L. (IV SETS) ×2 IMPLANT
TUBING STRAIGHTSHOT EPS 5PK (TUBING) ×2 IMPLANT

## 2023-08-20 NOTE — Anesthesia Procedure Notes (Signed)
 Procedure Name: Intubation Date/Time: 08/20/2023 8:27 AM  Performed by: Eulah Pont, CRNAPre-anesthesia Checklist: Patient identified, Emergency Drugs available, Suction available and Patient being monitored Patient Re-evaluated:Patient Re-evaluated prior to induction Oxygen Delivery Method: Circle System Utilized Preoxygenation: Pre-oxygenation with 100% oxygen Induction Type: IV induction Ventilation: Two handed mask ventilation required Laryngoscope Size: Miller and 2 Grade View: Grade III Tube type: Oral Tube size: 7.5 mm Number of attempts: 2 Airway Equipment and Method: Stylet and Oral airway Placement Confirmation: ETT inserted through vocal cords under direct vision, positive ETCO2 and breath sounds checked- equal and bilateral Secured at: 22 cm Tube secured with: Tape Dental Injury: Teeth and Oropharynx as per pre-operative assessment

## 2023-08-20 NOTE — Interval H&P Note (Signed)
 History and Physical Interval Note:  08/20/2023 7:17 AM  Scott Dodson  has presented today for surgery, with the diagnosis of CHRONIC SINUSITIS, NASAL POLYPS.  The various methods of treatment have been discussed with the patient and family. After consideration of risks, benefits and other options for treatment, the patient has consented to  Procedure(s): NASAL ENDOSCOPY (Bilateral) POLYPECTOMY NASAL (Bilateral) MAXILLARY ANTROSTOMY (Bilateral) SEPTOPLASTY (N/A) TURBINATE REDUCTION (Bilateral) SINUS ENDO WITH FUSION (N/A) as a surgical intervention.  The patient's history has been reviewed, patient examined, no change in status, stable for surgery.  I have reviewed the patient's chart and labs.  Questions were answered to the patient's satisfaction.     Ashok Croon

## 2023-08-20 NOTE — Transfer of Care (Signed)
 Immediate Anesthesia Transfer of Care Note  Patient: Scott Dodson  Procedure(s) Performed: NASAL ENDOSCOPY (Bilateral: Nose) POLYPECTOMY NASAL (Bilateral: Nose) MAXILLARY ANTROSTOMY (Bilateral: Nose) SEPTOPLASTY (Nose) TURBINATE REDUCTION (Bilateral: Nose) SINUS ENDO WITH FUSION (Nose)  Patient Location: PACU  Anesthesia Type:General  Level of Consciousness: awake, drowsy, and patient cooperative  Airway & Oxygen Therapy: Patient Spontanous Breathing  Post-op Assessment: Report given to RN, Post -op Vital signs reviewed and stable, and Patient moving all extremities X 4  Post vital signs: Reviewed and stable  Last Vitals:  Vitals Value Taken Time  BP 179/83 08/20/23 1016  Temp 97.0 08/20/23 1016  Pulse 52 08/20/23 1018  Resp 11 08/20/23 1018  SpO2 88 % 08/20/23 1018  Vitals shown include unfiled device data.  Last Pain:  Vitals:   08/20/23 0738  TempSrc:   PainSc: 0-No pain         Complications: No notable events documented.

## 2023-08-20 NOTE — Discharge Instructions (Addendum)
 Sinus Surgery Post-Operative Care Instructions  What are the sinuses?  The sinuses are air filled cavities (or holes) in the skull that are located adjacent to the nose. The tissue (mucosa) that lines these cavities and the nose swells and secretes mucus in response to infection or environmental irritants. Normally, the mucus produced by the sinuses drains into the nose and is, then, either swallowed or coughed up. With infection, the swelling of the sinus mucosa can make drainage difficult, leading to chronic, recurrent infections.  The triad of nasal congestion, facial discomfort and discolored nasal drainage most frequently defines chronic sinusitis.  There are four pairs of sinuses. (See figure below) 1) Maxillary Sinuses (cheek sinuses) 2) Ethmoid Sinuses (the sinuses located between the eyes)  3) Sphenoid Sinuses (the sinuses located behind the nose) 4) Frontal Sinuses (the sinuses located above the eyes)    How is sinusitis treated?  Antibiotics, steroids, nasal sprays, and decongestants are often successful in treating short-term bouts of sinusitis. When medications fail to provide adequate relief from sinusitis, surgery must be considered.  What is sinus surgery?  1) The goal of sinus surgery is to enlarge the natural openings of the sinuses into the nose. Enlarging these openings makes it easier for the sinuses to drain, even when swollen from infection or environmental irritants. Sinus surgery is also used to remove nasal polyps, nasal masses, and, sometimes, to straighten the nasal septum.  2) Using small cameras with lights on the end (endoscopes) the surgery is performed through the nose, without the need for any external incisions. In addition to the use of endoscopes, special instruments have been designed to perform the task of removing thickened and diseased tissue from the opening of these sinuses.  3) Sinus surgery is generally an outpatient procedure, lasting from one to four  hours. Although nasal packing is no longer common, "spacers" are used to aid proper healing of the sinus mucosa. The spacers will be removed during your first post-operative appointment.   4) Recovery time varies from patient to patient, but, in general, usually lasts between one to two weeks. Initially, patients should expect to feel congested and some mild sinus pressure. As the sinuses slowly heal, this congestion and pressure will decrease.  5) It is important to remember that, while we perform the surgery, you play an active role in the success of its outcome. It is up to you to abide by the postoperative restrictions and implement the postoperative care instructions.   The Do's and Do Not's of postoperative sinus care:  DO: DO take the pain medication prescribed: Oxycodone 5 mg every 6-8 hours as needed for pain. You may also use Tylenol every 6 hrs and take Oxycodone if pain is not controlled  DO take the antibiotics prescribed: Augmentin - 2 times a day for 10 days. Do take steroids - Medrol Dose Pack DO take live cultures while on the antibiotic: acidophilus/yogurt daily DO start your nasal irrigations the day after surgery.  These irrigations must be performed at least twice a day (more is preferable), however it does not hurt to perform them more frequently. This is essential to the healing process. It removes the crusts that form as the nasal tissue heals and prevents scarring within the nose. Please see the following page for specific instructions. Initially it might be difficult to get the saline to go into your nose because you have Ralph Leyden Splints in place, but once the splints have been removed, it will be easier to irrigate  DO cough and sneeze with your mouth open. DO eat a regular diet. DO take your pain medication before your first postoperative appointment.  DO NOT: DO NOT perform any heavy lifting (nothing greater than 15lbs), bending, straining. DO NOT blow your nose or pick  at your nose for at least 2 weeks DO NOT take aspirin or aspirin containing medications (Advil, Motrin, or any other NSAIDS) DO NOT fly without your doctor's clearance for 3-5 days after surgery DO NOT stop your prednisone until directed to do so by your surgeon.  Call Your Doctor Immediately If: Change in vision Increased swelling around the eyes Neck stiffness or deep head pain Continued Nausea or Vomiting Bright red blood that lasts more than ten minutes or causes choking Fever over 101 degrees     NASAL/SINUS IRRIGATIONS  It is required that you wash out your nose and sinus cavities with a saline solution. This is good in the post-operative period to flush out pus, crusts, and debris. In the long-term, this is also used to mechanically wash out infections. You can use the recipe below to make the irrigation solution or the packets that come with the [facility] Sinus Rinse squeeze bottle (see [facility] box instructions).  RECIPE:  1 quart boiled or distilled H2O  1 teaspoon canning/pickling/kosher salt (non-iodized)  1 teaspoon baking soda  Irrigate each nostril with 4oz [facility] squeeze bottle contains 8oz) of the above solution at least three times daily. While in the shower or leaning over a sink, aim the squeeze bottle (see figure 2) diagonally (away from the septum). The fluid will circulate in and out of your sinus cavities, coming back out the opposite nostril being irrigated. To accomplish this focus on making a "k" sound while you irrigate. This will close your palate so the irrigation does not wash out your mouth. The irrigations help to clean the clots from your nose and prevent scarring after surgery.   To view a video demonstration, please go to [email or web address]. Once on their home page, click the link on the left side of the screen reading, "[employer] Videos."  It may be convenient to mix larger quantities of the saline solution and store it in your refrigerator, warming up  each days supply prior to use. Consider buying one gallon of distilled water and adding 4 tsp of salt and 4 tsp of baking soda.

## 2023-08-20 NOTE — Op Note (Signed)
 OPERATIVE REPORT   PREOPERATIVE DIAGNOSIS: 1. Bilateral nasal polyposis 2. Septal deviation 3. Inferior turbinate hypertrophy 4. Chronic nasal obstruction 5. Mucus retention cyst, right maxillary sinus    POSTOPERATIVE DIAGNOSIS: 1. Bilateral nasal polyposis 2. Septal deviation 3. Inferior turbinate hypertrophy 4. Chronic nasal obstruction 5. Mucus retention cyst, right maxillary sinus    PROCEDURE: Septoplasty 2.  Inferior turbinate reduction 3. Bilateral nasal polypectomy 4. Removal of the mucus retention cyst right maxillary sinus  5. Stereotactic CT navigation used for duration of the procedure   SURGEON: Ashok Croon, MD   ANESTHESIA: General endotracheal.   ESTIMATED BLOOD LOSS: less than 15 mL.   COMPLICATIONS: None   CONDITION: Stable to PACU.   INTRAOPERATIVE FINDINGS: 1. Evidence of a large mucus retention cyst in the right maxillary sinus 2. Bilateral nasal polyps 3. Large caudal septal spur and septal deviation on the right side  4. Inferior turbinate hypertrophy   SPECIMEN: 1. Sinus contents from the left and right side submitted separately    INDICATIONS AND CONSENT: 73 year old male who presented to the Otolaryngology clinic with a longstanding history of chronic nasal congestion and nasal obstruction not responsive to maximal medical therapy. Examination including CT scan revealed evidence of septal deviation inferior turbinate hypertrophy and bilateral nasal polyps. There was also a large mucus retention cyst on the right side. They were therefore offered the aforementioned procedures. The procedure, risks, benefits, alternatives, potential complications, possible outcomes as well as the option of no treatment were reviewed with the patient who indicated they understood and wished to proceed forward with the procedure. Risks including but not limited to, the risk of anesthesia, bleeding, infection, scarring, change in smell, orbital injury, that could  result in vision loss, CSF leak that could result in intracranial injury, the possibility of recurrence of disease and need for additional procedures were discussed with the patient. Informed consent was obtained.   DESCRIPTION OF PROCEDURE: On 08/20/2023, the patient was identified in the preoperative area, consent confirmed, and transported to the operating suite. They were then transferred to the operating room table and placed in a supine position. After induction of general endotracheal anesthesia, the bed was rotated 90 degrees and a proper surgeon initiated time out was performed. Stereotactic CT navigation system was connected at that time. It was tested and found to be working properly and accurately used throughout the duration of the case to allow assistance and identification of landmark structures.The nasal cavity was then decongested with Afrin bilaterally using Afrin soaked pledgets and the patient was then prepped and draped in the usual sterile fashion.   A combination of zero degree and angled endoscopes were used throughout the entirety of the case. We first began by inspecting the right and left nasal cavities and injecting the lateral nasal wall and middle turbinate with 1% lidocaine with 1:100,000 parts epinephrine. We then turned our attention to a septal deviation.   First, a hemitransfixion incision was made on the right anterior septum and a mucoperichondrial flap was lifted off.  The cartilage was divided with D knife and a contralateral mucoperichondrial flap was also lifted.  Deviated portions of the nasal septum were then removed anteriorly to allow access to the remaining portion of the nasal passage. We used a cartilage crusher and after fashioning a small straight piece of the removed cartilage, it was placed between the mucoperichondrial flaps for additional structural support. We then performed a quilting stitch with Mellody Dance needle to prevent fluid collection and ensure  adequate healing.    Next the inferior turbinates were injected with lidocaine with epinephrine. A small incision was made in the anterior inferior portion of the turbinate and a freer elevator was used to lift a mucosal flap/staying close to the perichondrium of the turbinate. Next a 3 mm microdebrider was inserted into the right anterior inferior portion of the turbinate. A submucosal resection was then completed to allow for significant reduction of the turbinate size. The left inferior turbinate reduction was performed in an identical fashion.  We suctioned out the nasal passages and the naso/oropharynx.    We then proceeded with polypectomy. First, we removed diffuse bilateral nasal polyps bilaterally, which were primarily located along the side of the middle turbinate and at the inferior aspect of the middle turbinate. This was done with a microdebrider. This maneuver revealed the anatomic landmarks needed to proceed to the next step.   The right middle turbinate was medialized using a freer elevator. The uncinate process was missing and the ethmoid bulla was also missing with evidence of a small maxillary antrostomy window on the right side. There was an opening large enough to visualize a large mucus retention cyst in the maxillary sinus, and we decompressed and removed the cyst using a 45-degree micro debrider.    We next turned our attention to the left nasal cavity. The left maxillary antrostomy appeared to be partially patent once the nasal polyps were removed. We then irrigated the nose copiously and suctioned it out. We inspected the entire nasal cavity and nasopharynx and there was no evidence of retained foreign body or loose bony chips. We then placed Surgifoam into the middle meatus bilaterally to ensure patency in the immediate postoperative period. We then placed bilateral Doyle Splints into the left and right nasal passages, and secured them to the anterior septum with non-absorbable  suture.    This concluded our procedure. The patient tolerated the procedure well without any apparent immediate post operative complications. All counts were correct x2. The patient was rotated back to their original position, gently awakened from general anesthesia and taken to the PACU in stable condition.

## 2023-08-21 ENCOUNTER — Encounter (HOSPITAL_COMMUNITY): Payer: Self-pay | Admitting: Otolaryngology

## 2023-08-21 LAB — SURGICAL PATHOLOGY

## 2023-08-22 ENCOUNTER — Encounter (INDEPENDENT_AMBULATORY_CARE_PROVIDER_SITE_OTHER): Payer: Self-pay | Admitting: Otolaryngology

## 2023-08-22 ENCOUNTER — Ambulatory Visit (INDEPENDENT_AMBULATORY_CARE_PROVIDER_SITE_OTHER): Payer: Medicare Other | Admitting: Otolaryngology

## 2023-08-22 VITALS — BP 154/80 | HR 78

## 2023-08-22 DIAGNOSIS — J342 Deviated nasal septum: Secondary | ICD-10-CM

## 2023-08-22 DIAGNOSIS — R040 Epistaxis: Secondary | ICD-10-CM

## 2023-08-22 DIAGNOSIS — J343 Hypertrophy of nasal turbinates: Secondary | ICD-10-CM

## 2023-08-22 DIAGNOSIS — J3489 Other specified disorders of nose and nasal sinuses: Secondary | ICD-10-CM

## 2023-08-22 NOTE — Progress Notes (Signed)
 ENT Progress Note:  Update 08/22/2023  Discussed the use of AI scribe software for clinical note transcription with the patient, who gave verbal consent to proceed.  History of Present Illness   Scott Dodson "Scott Dodson" is a 73 year old male who presents with persistent epistaxis following septoplasty/nasal polypectomy and inferior turbinate reduction 2 days ago.   He underwent nasal surgery and was discharged home, but has been experiencing recurrent epistaxis mostly from the left side since then. Initially, both sides of his nose were bleeding, but the right side stopped bleeding yesterday.   He has been using mustache dressings. The bleeding occurs without any specific trigger, sometimes starting when he leans forward.  He is currently taking antibiotics and steroids as part of his post-surgical care. He is not on any blood thinners.     Records Reviewed:  Initial Evaluation  Update 08/04/23 Discussed the use of AI scribe software for clinical note transcription with the patient, who gave verbal consent to proceed.  History of Present Illness   The patient is a 73 year old male with hx of chronic nasal congestion and bilateral nasal polyps who presents for f/u.  He has been doing steroid nasal rinses, including a device that allows rinsing in both directions similar to Navage, which improved their nasal congestion, sense of smell, and ease of breathing through the nose.  They have arranged a payment plan with their insurance company to be able to start Dupixent. They have a history of a heart attack in 2011 and have undergone heart checks, including echocardiograms, with no recent issues. They have a cardiologist, Dr. Clifton James, and had seen their team for preop clearance already and had Echo done.     Cariology clearance note 05/19/23 Preoperative cardiac evaluation:   According to the Revised Cardiac Risk Index (RCRI), his Perioperative Risk of Major Cardiac Event is (%): 0.4   His  Functional Capacity in METs is: 7.66 according to the Duke Activity Status Index (DASI).    Per office protocol, if patient is without any new symptoms or concerns at the time of their virtual visit, he/she may hold ASA for 7 days prior to procedure. Please resume ASA as soon as possible postprocedure, at the discretion of the surgeon.     However due to to history of CAD hypertension and diabetes, I am going to repeat his echocardiogram for any changes in LV function prior to clearing for surgery.  If echocardiogram is normal we will release him to move forward with his planned procedure.    2.  Coronary artery disease: Status post STEMI in 2011 with DES to the ostium of the obtuse marginal of the circumflex.   3.  Hypercholesterolemia: Followed by primary care provider.  Goal of LDL less than 70.   4.  Hypertension: Currently well-controlled on medication regimen.  Primary care provider is managing labs.   Scott Dodson would like to be reestablished with Dr. Clifton James as he was his primary cardiologist in the past.  We will make sure he has a follow-up appointment with him in a year to 18 months to continue relationship.  Update 04/28/23: he returns for follow-up after neck U/S and MBS//esophagram CT sinuses. Reports no changes in the size of neck lump, denies pain or skin changes. Swallowing function is stable, on regular diet. Continues to have nasal congestion, and started nasal saline rinses. On antihistamine. On Famotidine.   Initial consult 03/13/23:   Reason for Consult: neck mass and dysphagia  nasal  congestion phlegm   HPI: Scott Dodson is an 73 y.o. male with hx of MI in 2011, history of diabetes, former smoking, quit in 70s, right TM reconstruction due to history of chronic ear infections performed, who is here for evaluation of longstanding chronic dysphagia symptoms and left anterior neck mass.   He is here for chronic dysphagia symptoms primarily with solids. He reports no issues  with liquids. When he is eating solid foods it would get stuck in his throat. Eventually it all goes down. He has food regurgitation (undigested). Usually bread is hardest for him. Foods with particles and dry foods cause him to cough and choke.  This has been a problem for over 4-5 yrs.  He reports persistent phlegm in the back of his throat.  He also reports chronic nasal congestion and symptoms of difficulties with breathing through his nose.  He was evaluated by Dr. Ernestene Kiel, and had MBS and esophagram.  He also had CT neck.  I reviewed all imaging and summarized findings in assessment and plan.   She reports longstanding history of a lump along the left anterior neck, that did not bother him, but was noted by one of the physicians who saw him in the past, denies pain, denies significant changes in size of the lump. Of note CT soft tissue neck did not mention any lymphadenopathy or any evidence of anterior left neck mass that was performed on September 25, 2022.   Records Reviewed:  Dr Harvie Junior note from 11/04/22 Scott Dodson is a 73 y.o. year old male who presents for evaluation of a left neck nodule.  The patient reports a non-painful lump on the left side of his neck, which he first noticed approximately a year ago. The nodule fluctuates in size, occasionally decreasing in size. He denies experiencing fevers, chills, night sweats, or weight loss.  The patient experiences dysphagia, particularly when consuming tablets and solids. He describes a sensation of food becoming lodged in his throat. He has not yet scheduled his swallow study ordered by Gastroenterology. He denies experiencing reflux. Depending on the type of food consumed, he experiences choking and coughing.  He has no history of sinus surgery. He denies significant nasal congestion and generally breathes through his nose without difficulty. He experiences facial pain and pressure during colds. His olfactory function is normal. He experiences  persistent throat drainage. He has Flonase available for use. He suffers from numerous allergies.  He smoked a long time ago. He drinks 2 alcoholic beverages per night. He is retired from his main job and now works part-time. He works for [employer].   GI records Dr. Amada Jupiter 08/30/22 Clinical details of this patient's symptoms are in my July 03, 2022 office consult note. About 2 years of chronic solid food dysphagia. Scott Dodson reports having noticed a lump in the left side of his neck for the last few weeks.    EGD   The examined esophagus was normal. The scope was                            withdrawn. Dilation was performed with a Maloney                            dilator with no resistance at 52 Fr.  The stomach was normal.                           The cardia and gastric fundus were normal on                            retroflexion. (Hill grade 1)                           The examined duodenum was normal.                           The larynx could not be well-visualized. Complications:            No immediate complications.   Past Medical History:  Diagnosis Date   Allergy    Asthma    Coronary artery disease    s/p lateral MI 4 /11, s/p Promus DES OM2   Diabetes mellitus without complication (HCC)    Heart murmur    History of pneumonia    Hyperlipidemia    Hypertension    Myocardial infarct [facility]) 2011    Past Surgical History:  Procedure Laterality Date   ADENOIDECTOMY     COLONOSCOPY     CORONARY STENT PLACEMENT  2011   MAXILLARY ANTROSTOMY Bilateral 08/20/2023   Procedure: MAXILLARY ANTROSTOMY;  Surgeon: Ashok Croon, MD;  Location: [facility] OR;  Service: ENT;  Laterality: Bilateral;   NASAL ENDOSCOPY Bilateral 08/20/2023   Procedure: NASAL ENDOSCOPY;  Surgeon: Ashok Croon, MD;  Location: [facility] OR;  Service: ENT;  Laterality: Bilateral;   POLYPECTOMY Bilateral 08/20/2023   Procedure: POLYPECTOMY NASAL;  Surgeon: Ashok Croon, MD;  Location: [facility] OR;  Service: ENT;  Laterality: Bilateral;   SEPTOPLASTY N/A 08/20/2023   Procedure: SEPTOPLASTY;  Surgeon: Ashok Croon, MD;  Location: [facility] OR;  Service: ENT;  Laterality: N/A;   SINUS ENDO WITH FUSION N/A 08/20/2023   Procedure: SINUS ENDO WITH FUSION;  Surgeon: Ashok Croon, MD;  Location: [facility] OR;  Service: ENT;  Laterality: N/A;   TONSILLECTOMY     TURBINATE REDUCTION Bilateral 08/20/2023   Procedure: TURBINATE REDUCTION;  Surgeon: Ashok Croon, MD;  Location: [facility] OR;  Service: ENT;  Laterality: Bilateral;   TYMPANIC MEMBRANE REPAIR Right 2004   UPPER GASTROINTESTINAL ENDOSCOPY     VASECTOMY      Family History  Problem Relation Age of Onset   Heart disease Mother    Hypertension Mother    Heart disease Father    Hypertension Father    Heart disease Brother    Heart disease Sister    Colon cancer Maternal Grandfather 54   Colon polyps Neg Hx    Esophageal cancer Neg Hx    Stomach cancer Neg Hx    Rectal cancer Neg Hx     Social History:  reports that he quit smoking about 48 years ago. His smoking use included cigarettes. He started smoking about 53 years ago. He has a 2.5 pack-year smoking history. He has never used smokeless tobacco. He reports current alcohol use of about 5.0 standard drinks of alcohol per week. He reports that he does not use drugs.  Allergies: No Known Allergies  Medications: I have reviewed the patient's current medications.  The PMH, PSH, Medications, Allergies, and SH were reviewed and updated.  ROS: Constitutional: Negative for fever, weight loss and weight  gain. Cardiovascular: Negative for chest pain and dyspnea on exertion. Respiratory: Is not experiencing shortness of breath at rest. Gastrointestinal: Negative for nausea and vomiting. Neurological: Negative for headaches. Psychiatric: The patient is not nervous/anxious  Blood pressure (!) 154/80, pulse 78, SpO2 98%.  PHYSICAL EXAM:  Exam: General:  Well-developed, well-nourished Respiratory Respiratory effort: Equal inspiration and expiration without stridor Cardiovascular Peripheral Vascular: Warm extremities with equal color/perfusion Eyes: No nystagmus with equal extraocular motion bilaterally Neuro/Psych/Balance: Patient oriented to person, place, and time; Appropriate mood and affect; Gait is intact with no imbalance; Cranial nerves I-XII are intact Head and Face Inspection: Normocephalic and atraumatic without mass or lesion Facial Strength: Facial motility symmetric and full bilaterally ENT Pinna: External ear intact and fully developed External canal: Canal is patent with intact skin Tympanic Membrane: Clear and mobile External Nose: No scar or anatomic deformity Internal Nose: Septal Doyle splints are in place, dry blood and small amount bright red blood in the splint. When suctioned no anterior epistaxis  Lips, Teeth, and gums: Mucosa and teeth intact and viable, missing teeth on exam Oral cavity/oropharynx: No erythema or exudate, no lesions present No active bleeding along posterior pharynx Nasopharynx: No mass or lesion with intact mucosa  Neck Neck and Trachea: Midline trachea without mass or lesion.  Procedure: PROCEDURE NOTE: nasal endoscopy  Preoperative diagnosis: epistaxis and hx of septoplasty/polypectomy/ITR 2 days ago    Postoperative diagnosis: same   Procedure: Diagnostic nasal endoscopy (16109)  Surgeon: Ashok Croon, M.D.  Anesthesia: Topical lidocaine and Afrin  H&P REVIEW: The patient's history and physical were reviewed today prior to procedure. All medications were reviewed and updated as well. Complications: None Condition is stable throughout exam Indications and consent: The patient presents with symptoms of chronic sinusitis not responding to previous therapies. All the risks, benefits, and potential complications were reviewed with the patient preoperatively and informed consent was  obtained. The time out was completed with confirmation of the correct procedure.   Procedure: The patient was seated upright in the clinic. Topical lidocaine and Afrin were applied to the nasal cavity. After adequate anesthesia had occurred, the rigid nasal endoscope was passed into the nasal cavity. The nasal mucosa, turbinates, septum,  were visualized bilaterally. This revealed no purulence or significant secretions that might be cultured. There was dry blood and at the nares bilaterally, Doyle splints were in place, no active bleeding was noted. The visualized mucosa was intact but complete exam was not performed due to presence of Doyle splints. The scope was then slowly withdrawn and the patient tolerated the procedure well. There were no complications or blood loss.  Studies Reviewed: CT soft tissue neck 09/25/2022 EXAM: CT NECK WITH CONTRAST   TECHNIQUE: Multidetector CT imaging of the neck was performed using the standard protocol following the bolus administration of intravenous contrast.   RADIATION DOSE REDUCTION: This exam was performed according to the departmental dose-optimization program which includes automated exposure control, adjustment of the mA and/or kV according to patient size and/or use of iterative reconstruction technique.   CONTRAST:  75mL OMNIPAQUE IOHEXOL 350 MG/ML SOLN   COMPARISON:  None Available.   FINDINGS: Pharynx and larynx: No evidence of a mass or swelling. Widely patent airway. No fluid collection or inflammatory changes in the parapharyngeal or retropharyngeal spaces.   Salivary glands: No inflammation, mass, or stone.   Thyroid: Unremarkable.   Lymph nodes: No enlarged or suspicious lymph nodes in the neck.   Vascular: Major vascular structures of the neck are  patent. Mild atherosclerotic calcification at the carotid bifurcations without evidence of significant stenosis.   Limited intracranial: Unremarkable   Visualized orbits:  Unremarkable.   Mastoids and visualized paranasal sinuses: Mucous retention cysts in the maxillary sinuses. Clear mastoid air cells.   Skeleton: Bulky anterior vertebral ossification throughout the cervical spine. No suspicious osseous lesion.   Upper chest: Clear lung apices.   Other: None.   IMPRESSION: No neck mass, lymphadenopathy, or acute abnormality identified.  Swallow study MBS and esophagram 02/27/23 Esophagram FINDINGS: Swallowing: Appears normal. No vestibular penetration or aspiration seen. Residual contrast remains in vallecula.   Pharynx: Unremarkable.   Esophagus: Normal appearance.  No mucosal lesions or strictures   Esophageal motility: Within normal limits.   Hiatal Hernia: None.   Gastroesophageal reflux: None visualized even with provocative maneuvers such as cough and water siphon test.   Ingested 13 mm barium tablet: Tablet was given during modified barium swallow. See additional report for findings.   IMPRESSION: No evidence of aspiration.   Negative esophagram.  MBS 02/27/23 HPI/PMH: HPI: Pt with c/o "difficulty swallowing pills"; esophagram completed prior to MBS.  Pt denies any neurological hx of CVA, GERD or PNA related to swallowing.     Clinical Impression: Clinical Impression: Pt presents with mild pharyngoesophageal dysphagia c/b brief trace vestibular penetration (PAS 2) with small amounts of thin liquids/nectar thick liquids d/t partial superior laryngeal movement and decreased anterior movement paired with partial epiglottic inversion.  Pt able to consume larger volumes of thin without this occurrence; although vallecular/pyriform sinus retention noted d/t partial esophageal obstruction of flow.  Pt utilized repetitive swallows, liquid wash and effortful swallow to clear vallecular space and pyriform sinuses/posterior pharyngeal wall residue with good results.  Pt unable to swallow 13 mm tablet despite use of compensatory strategies, so  pill was regurgitated into oral cavity and removed.  No aspiration noted with any consistency.  Recommend regular/thin liquid diet with utilization of strategies listed above.  Pt given esophageal precautions and strategies utilized during study to implement during meals.  Pt agreeable and appreciative.  If symptoms persist, may consider short-term ST for pharyngoesophageal symptoms prn.  Thank you for this consult.      Assessment/Plan: Encounter Diagnoses  Name Primary?   Epistaxis Yes   Hypertrophy of both inferior nasal turbinates    Nasal septal deviation    Nasal obstruction        Chronic nasal congestion and nasal obstruction, evidence of nasal polyps b/l on nasal endoscopy today with post-nasal drainage and mucosal edema, concern for chronic sinusitis. Will initiate medical management below and obtain CT sinuses  - I discussed with the patient that his sx are likely 2/2 undiagnosed chronic environmental allergies  - CBC with diff + total IgE to complete workup for CRSwNP -I reviewed CT neck he had back in March of this year and there is evidence of mucosal thickening bilaterally maxillary sinuses concerning for chronic sinus disease - nasal saline rinses  2. Anterior neck lump - palpable round left anterior neck mass, ~ 1-2 cm in size, along left mid SCM border on exam, I reviewed CT neck he had back in March and there was no evidence of lymphadenopathy or suspicious masses back then - neck soft tissue U/S to better evaluate -Will consider repeat CT neck if needed -Differential diagnosis includes cervical lymph node versus lipoma versus other pathology -he does have a remote history of smoking  3.  Chronic dysphagia -patient with longstanding chronic symptoms of dysphagia  limited to solids and pills, I reviewed results of his recent modified barium swallow and esophagram which showed mild pharyngoesophageal dysphagia per SLP report no aspiration, images of the swallow study  also demonstrate a prominent osteophyte which I suspect could be one of the reasons for dysphagia symptoms, although esophagram is somewhat limited in the segment of UES, I did not appreciate prominent CP bar.  -Flexible laryngoscopy portion of the exam today with evidence of minimal secretions along hypopharynx, bilateral vocal folds were mobile, mildly atrophic, there was evidence of moderate postcricoid edema/pachydermia -I discussed exam findings with the patient and we will plan to consider swallow therapy in the future as well as a consultation with spine surgery -At this time he does not have weight loss or any evidence of aspiration, although could benefit from swallow therapy and if candidate for osteophyte removal, surgical management of cervical osteophyte could improve his symptoms. -Will further discuss when he returns after imaging  4. GERD LPR -evidence of findings consistent with GERD LPR on flexible laryngoscopy today which could also contribute to sensation of phlegm and lump in his throat and dysphagia symptoms - Famotidine 20 mg BID  - reflux gourmet trial -diet lifestyle changes to minimize reflux    - RTC after testing   Update 04/28/23 CT sinuses with evidence of mucus retention cysts in maxillary sinuses, otherwise no evidence of CRS, also had evidence of septal deviation and ITH and what appears like nasal polyps along b/l nasal passages, which is c/w with his nasal endoscopy.   Chronic nasal congestion/obstruction and evidence of nasal polyps and NSD/ITH on CT sinuses and repeat nasal endoscopy today  - CT findings c/w NSD/ITH and b/l nasal polyps - will use for image guidance if he elects to proceed with surgery  - he did not complete labs (CBC/diff and total IgE) - we discussed management of chronic nasal congestion and risks and benefits of surgical removal of nasal polyps/septoplasty/ITR and he is interested - will refer to Allergy for Dupixent consultation to  prevent polyp recurrence  - start nasal saline/steroid rinses - Rx sent for mometasone (will stop Flonase) - continue Clarinex 5 mg daily   Chronic dysphagia - on regular/thin liquid diet right now - patient with longstanding chronic symptoms of dysphagia limited to solids and pills, I reviewed results of his recent modified barium swallow and esophagram which showed mild pharyngoesophageal dysphagia per SLP report no aspiration, images of the swallow study also demonstrate a prominent osteophyte which I suspect could be one of the reasons for dysphagia symptoms, although esophagram is somewhat limited in the segment of UES, I did not appreciate prominent CP bar.  -Flexible laryngoscopy during the initial consultation with evidence of minimal secretions along hypopharynx, bilateral vocal folds were mobile, mildly atrophic, there was evidence of moderate postcricoid edema/pachydermia - will consider consulting Spine Surgery if dysphagia sx worsen (2/2 evidence of cervical osteophyte - will proceed with swallow therapy referral   3. Neck lipoma  - neck U/S with evidence of 2.6 cm subcutaneous nodule (same density as surrounding fat) c/w likely lipoma - he denies any recent changes in size, pain or numbness/sx from the mass, has had it for a long time - will observe and repeat imaging if grows over time or if he develops sx - had flexible scope without evidence of mass/tumor during his last office visit   4. Hx of environmental allergies  - schedule Allergy consultation and medical management as above  5. Hx of  smoking, quit several years ago, reports an episode of hypoxia in the setting of URI, requiring admission to the hospital for a few days   - Pulmonary consultation to rule out lung disease such as reactive airway disease or COPD   6. Chronic GERD LPR - continue Famotidine 20 mg daily (refill sent) and reflux gourmet   Update 08/04/23 Assessment and Plan    CRS with Bilateral Nasal  Polyps Chronic bilateral nasal polyps causing  chronic nasal congestion and anosmia. Current treatment with nasal steroid rinses has provided some symptom relief. Persistent polyps on nasal endoscopy today. Discussed surgery versus medical management with Dupixent. Surgery aims to improve nasal airflow and facilitate better rinsing/remove nasal polyps. Dupixent is for preventing polyp regrowth. The patient opted for surgical removal with post-op Dupixent. - Schedule for b/l nasal endoscopy with polyp removal, possible bilateral maxillary antrostomy possible septoplasty and inferior turbinate reduction - Fax cardiology clearance request  - already seen and had repeat Echo, which appears without major changes - Continue nasal steroid rinses  Chronic nasal congestion and environmental allergies Dupixent recommended by allergy specialist to prevent polyp regrowth. Patient has arranged a payment plan with insurance.  - Continue current allergy management - proceed with Dupixent therapy - continue nasal steroid rinses - continue daily Clarinex 5 mg   Follow-up - Await call from surgery scheduling team - Follow up with cardiologist for surgical clearance.   Update 08/22/2023 Assessment and Plan    Epistaxis post-septoplasty/ITR and nasal polypectomy, mainly left side Persistent left-sided epistaxis following septoplasty. Currently on antibiotics and steroids. No active bleeding observed on exam, dry blood at the nares some blood in the [facility] splint. No evidence of posterior drainage c/w persistent epistaxis. Preference to keep Doyle splints in place to secure mucosal flaps and avoid issues with healing was discussed. Small piece of Sergisil was placed along Doyle splint on the left side. Advised against nasal irrigation for now to prevent clot dislodgement and help with healing. Explained potential color change in blood due to Sergisil properties. - Avoid nasal irrigation; use regular saline spray  (Ocean Spray) instead until [place] splints have been removed - Use mustache dressing to monitor bleeding frequency and volume - Complete antibiotic course - Avoid activities that raise blood pressure or increase head pressure (e.g., heavy lifting, using a straw, sneezing with a closed mouth) - Use saline sprays and apply Vaseline to nasal passages to prevent dryness - Gargle with ice-cold water to clear blood from the throat - Follow-up on Tuesday, August 26, 2023, at 1:00 PM  Follow-up - Follow-up on Tuesday, August 26, 2023, at 1:00 PM - Call on-call line if persistent bleeding occurs over the weekend.        Ashok Croon, MD Otolaryngology [facility] Phone: [phone] Fax: [phone]    08/22/2023, 1:09 PM

## 2023-08-22 NOTE — Anesthesia Postprocedure Evaluation (Signed)
 Anesthesia Post Note  Patient: Scott Dodson  Procedure(s) Performed: NASAL ENDOSCOPY (Bilateral: Nose) POLYPECTOMY NASAL (Bilateral: Nose) MAXILLARY ANTROSTOMY (Bilateral: Nose) SEPTOPLASTY (Nose) TURBINATE REDUCTION (Bilateral: Nose) SINUS ENDO WITH FUSION (Nose)     Patient location during evaluation: PACU Anesthesia Type: General Level of consciousness: awake and alert Pain management: pain level controlled Vital Signs Assessment: post-procedure vital signs reviewed and stable Respiratory status: spontaneous breathing, nonlabored ventilation and respiratory function stable Cardiovascular status: blood pressure returned to baseline and stable Postop Assessment: no apparent nausea or vomiting Anesthetic complications: no   No notable events documented.  Last Vitals:  Vitals:   08/20/23 1045 08/20/23 1100  BP: (!) 171/82   Pulse: (!) 58 61  Resp: 10 10  Temp:    SpO2: 90% 92%    Last Pain:  Vitals:   08/20/23 1045  TempSrc:   PainSc: 0-No pain                 Jahmia Berrett

## 2023-08-26 ENCOUNTER — Encounter (INDEPENDENT_AMBULATORY_CARE_PROVIDER_SITE_OTHER): Payer: Self-pay | Admitting: Otolaryngology

## 2023-08-26 ENCOUNTER — Ambulatory Visit (INDEPENDENT_AMBULATORY_CARE_PROVIDER_SITE_OTHER): Payer: Medicare Other | Admitting: Otolaryngology

## 2023-08-26 VITALS — BP 155/75 | HR 51

## 2023-08-26 DIAGNOSIS — J339 Nasal polyp, unspecified: Secondary | ICD-10-CM

## 2023-08-26 DIAGNOSIS — R0982 Postnasal drip: Secondary | ICD-10-CM

## 2023-08-26 DIAGNOSIS — R040 Epistaxis: Secondary | ICD-10-CM

## 2023-08-26 DIAGNOSIS — Z9889 Other specified postprocedural states: Secondary | ICD-10-CM | POA: Diagnosis not present

## 2023-08-26 DIAGNOSIS — J342 Deviated nasal septum: Secondary | ICD-10-CM

## 2023-08-26 DIAGNOSIS — J329 Chronic sinusitis, unspecified: Secondary | ICD-10-CM

## 2023-08-26 DIAGNOSIS — R0981 Nasal congestion: Secondary | ICD-10-CM

## 2023-08-26 MED ORDER — OXYMETAZOLINE HCL 0.05 % NA SOLN: 2.0000 | Freq: Two times a day (BID) | NASAL | 0 refills | Status: AC | PRN

## 2023-08-26 NOTE — Progress Notes (Signed)
 ENT Progress Note:   Update 08/26/2023  Discussed the use of AI scribe software for clinical note transcription with the patient, who gave verbal consent to proceed.  History of Present Illness   Scott Dodson "Scott Dodson" is a 73 year old male 1 week post-op following septoplasty/ITR and nasal polypectomy,  here for post-op f/u   He experienced persistent epistaxis post-op mostly from the left side but sx improved and he had no epistaxis for 24 hrs.   He has difficulty breathing through his nose, particularly on the left side, but can breathe slightly through the other side. He feels discomfort with the current nasal packing and is eager for its removal.  He mentions sensitivity in the nose, particularly when the splints are manipulated.  He has not been taking aspirin, which he normally does, and has been managing without it since the procedure to avoid epistaxis.     Records Reviewed:  Initial Evaluation  Update 08/04/23 Discussed the use of AI scribe software for clinical note transcription with the patient, who gave verbal consent to proceed.  History of Present Illness   The patient is a 73 year old male with hx of chronic nasal congestion and bilateral nasal polyps who presents for f/u.  He has been doing steroid nasal rinses, including a device that allows rinsing in both directions similar to Navage, which improved their nasal congestion, sense of smell, and ease of breathing through the nose.  They have arranged a payment plan with their insurance company to be able to start Dupixent. They have a history of a heart attack in 2011 and have undergone heart checks, including echocardiograms, with no recent issues. They have a cardiologist, Dr. Clifton James, and had seen their team for preop clearance already and had Echo done.     Cariology clearance note 05/19/23 Preoperative cardiac evaluation:   According to the Revised Cardiac Risk Index (RCRI), his Perioperative Risk of Major Cardiac  Event is (%): 0.4   His Functional Capacity in METs is: 7.66 according to the Duke Activity Status Index (DASI).    Per office protocol, if patient is without any new symptoms or concerns at the time of their virtual visit, he/she may hold ASA for 7 days prior to procedure. Please resume ASA as soon as possible postprocedure, at the discretion of the surgeon.     However due to to history of CAD hypertension and diabetes, I am going to repeat his echocardiogram for any changes in LV function prior to clearing for surgery.  If echocardiogram is normal we will release him to move forward with his planned procedure.    2.  Coronary artery disease: Status post STEMI in 2011 with DES to the ostium of the obtuse marginal of the circumflex.   3.  Hypercholesterolemia: Followed by primary care provider.  Goal of LDL less than 70.   4.  Hypertension: Currently well-controlled on medication regimen.  Primary care provider is managing labs.   Scott Dodson would like to be reestablished with Dr. Clifton James as he was his primary cardiologist in the past.  We will make sure he has a follow-up appointment with him in a year to 18 months to continue relationship.  Update 04/28/23: he returns for follow-up after neck U/S and MBS//esophagram CT sinuses. Reports no changes in the size of neck lump, denies pain or skin changes. Swallowing function is stable, on regular diet. Continues to have nasal congestion, and started nasal saline rinses. On antihistamine. On Famotidine.  Initial consult 03/13/23:   Reason for Consult: neck mass and dysphagia  nasal congestion phlegm   HPI: Scott Dodson is an 73 y.o. male with hx of MI in 2011, history of diabetes, former smoking, quit in 70s, right TM reconstruction due to history of chronic ear infections performed, who is here for evaluation of longstanding chronic dysphagia symptoms and left anterior neck mass.   He is here for chronic dysphagia symptoms primarily with  solids. He reports no issues with liquids. When he is eating solid foods it would get stuck in his throat. Eventually it all goes down. He has food regurgitation (undigested). Usually bread is hardest for him. Foods with particles and dry foods cause him to cough and choke.  This has been a problem for over 4-5 yrs.  He reports persistent phlegm in the back of his throat.  He also reports chronic nasal congestion and symptoms of difficulties with breathing through his nose.  He was evaluated by Dr. Ernestene Kiel, and had MBS and esophagram.  He also had CT neck.  I reviewed all imaging and summarized findings in assessment and plan.   She reports longstanding history of a lump along the left anterior neck, that did not bother him, but was noted by one of the physicians who saw him in the past, denies pain, denies significant changes in size of the lump. Of note CT soft tissue neck did not mention any lymphadenopathy or any evidence of anterior left neck mass that was performed on September 25, 2022.   Records Reviewed:  Dr Harvie Junior note from 11/04/22 Scott Dodson is a 73 y.o. year old male who presents for evaluation of a left neck nodule.  The patient reports a non-painful lump on the left side of his neck, which he first noticed approximately a year ago. The nodule fluctuates in size, occasionally decreasing in size. He denies experiencing fevers, chills, night sweats, or weight loss.  The patient experiences dysphagia, particularly when consuming tablets and solids. He describes a sensation of food becoming lodged in his throat. He has not yet scheduled his swallow study ordered by Gastroenterology. He denies experiencing reflux. Depending on the type of food consumed, he experiences choking and coughing.  He has no history of sinus surgery. He denies significant nasal congestion and generally breathes through his nose without difficulty. He experiences facial pain and pressure during colds. His olfactory  function is normal. He experiences persistent throat drainage. He has Flonase available for use. He suffers from numerous allergies.  He smoked a long time ago. He drinks 2 alcoholic beverages per night. He is retired from his main job and now works part-time. He works for [employer].   GI records Dr. Amada Jupiter 08/30/22 Clinical details of this patient's symptoms are in my July 03, 2022 office consult note. About 2 years of chronic solid food dysphagia. Scott Dodson reports having noticed a lump in the left side of his neck for the last few weeks.    EGD   The examined esophagus was normal. The scope was                            withdrawn. Dilation was performed with a Maloney                            dilator with no resistance at 52 Fr.  The stomach was normal.                           The cardia and gastric fundus were normal on                            retroflexion. (Hill grade 1)                           The examined duodenum was normal.                           The larynx could not be well-visualized. Complications:            No immediate complications.   Past Medical History:  Diagnosis Date   Allergy    Asthma    Coronary artery disease    s/p lateral MI 4 /11, s/p Promus DES OM2   Diabetes mellitus without complication (HCC)    Heart murmur    History of pneumonia    Hyperlipidemia    Hypertension    Myocardial infarct [facility]) 2011    Past Surgical History:  Procedure Laterality Date   ADENOIDECTOMY     COLONOSCOPY     CORONARY STENT PLACEMENT  2011   MAXILLARY ANTROSTOMY Bilateral 08/20/2023   Procedure: MAXILLARY ANTROSTOMY;  Surgeon: Ashok Croon, MD;  Location: [facility] OR;  Service: ENT;  Laterality: Bilateral;   NASAL ENDOSCOPY Bilateral 08/20/2023   Procedure: NASAL ENDOSCOPY;  Surgeon: Ashok Croon, MD;  Location: [facility] OR;  Service: ENT;  Laterality: Bilateral;   POLYPECTOMY Bilateral 08/20/2023   Procedure:  POLYPECTOMY NASAL;  Surgeon: Ashok Croon, MD;  Location: [facility] OR;  Service: ENT;  Laterality: Bilateral;   SEPTOPLASTY N/A 08/20/2023   Procedure: SEPTOPLASTY;  Surgeon: Ashok Croon, MD;  Location: [facility] OR;  Service: ENT;  Laterality: N/A;   SINUS ENDO WITH FUSION N/A 08/20/2023   Procedure: SINUS ENDO WITH FUSION;  Surgeon: Ashok Croon, MD;  Location: [facility] OR;  Service: ENT;  Laterality: N/A;   TONSILLECTOMY     TURBINATE REDUCTION Bilateral 08/20/2023   Procedure: TURBINATE REDUCTION;  Surgeon: Ashok Croon, MD;  Location: [facility] OR;  Service: ENT;  Laterality: Bilateral;   TYMPANIC MEMBRANE REPAIR Right 2004   UPPER GASTROINTESTINAL ENDOSCOPY     VASECTOMY      Family History  Problem Relation Age of Onset   Heart disease Mother    Hypertension Mother    Heart disease Father    Hypertension Father    Heart disease Brother    Heart disease Sister    Colon cancer Maternal Grandfather 10   Colon polyps Neg Hx    Esophageal cancer Neg Hx    Stomach cancer Neg Hx    Rectal cancer Neg Hx     Social History:  reports that he quit smoking about 48 years ago. His smoking use included cigarettes. He started smoking about 53 years ago. He has a 2.5 pack-year smoking history. He has never used smokeless tobacco. He reports current alcohol use of about 5.0 standard drinks of alcohol per week. He reports that he does not use drugs.  Allergies: No Known Allergies  Medications: I have reviewed the patient's current medications.  The PMH, PSH, Medications, Allergies, and SH were reviewed and updated.  ROS: Constitutional: Negative for fever, weight loss and weight  gain. Cardiovascular: Negative for chest pain and dyspnea on exertion. Respiratory: Is not experiencing shortness of breath at rest. Gastrointestinal: Negative for nausea and vomiting. Neurological: Negative for headaches. Psychiatric: The patient is not nervous/anxious  Blood pressure (!) 155/75, pulse (!) 51, SpO2  97%.  PHYSICAL EXAM:  Exam: General: Well-developed, well-nourished Respiratory Respiratory effort: Equal inspiration and expiration without stridor Cardiovascular Peripheral Vascular: Warm extremities with equal color/perfusion Eyes: No nystagmus with equal extraocular motion bilaterally Neuro/Psych/Balance: Patient oriented to person, place, and time; Appropriate mood and affect; Gait is intact with no imbalance; Cranial nerves I-XII are intact Head and Face Inspection: Normocephalic and atraumatic without mass or lesion Facial Strength: Facial motility symmetric and full bilaterally ENT Pinna: External ear intact and fully developed External canal: Canal is patent with intact skin Tympanic Membrane: Clear and mobile External Nose: No scar or anatomic deformity Internal Nose: Septal Doyle splints removed, some clot suctioned out of both nasal passages, septal mucosa intact, no septal hematoma. When suctioned no anterior epistaxis seen, previously placed Surgicel removed.  Lips, Teeth, and gums: Mucosa and teeth intact and viable, missing teeth on exam Oral cavity/oropharynx: No erythema or exudate, no lesions present No active bleeding along posterior pharynx Nasopharynx: No mass or lesion with intact mucosa  Neck Neck and Trachea: Midline trachea without mass or lesion.  Procedure: PROCEDURE NOTE: nasal endoscopy  Preoperative diagnosis: epistaxis and hx of septoplasty/polypectomy/ITR 7 days ago    Postoperative diagnosis: same   Procedure: Diagnostic nasal endoscopy (16109)  Surgeon: Ashok Croon, Scott.D.  Anesthesia: Topical lidocaine and Afrin  H&P REVIEW: The patient's history and physical were reviewed today prior to procedure. All medications were reviewed and updated as well. Complications: None Condition is stable throughout exam Indications and consent: The patient presents with symptoms of chronic sinusitis not responding to previous therapies. All the risks,  benefits, and potential complications were reviewed with the patient preoperatively and informed consent was obtained. The time out was completed with confirmation of the correct procedure.   Procedure: The patient was seated upright in the clinic. Topical lidocaine and Afrin were applied to the nasal cavity. After adequate anesthesia had occurred, the rigid nasal endoscope was passed into the nasal cavity. The nasal mucosa, turbinates, septum,  were visualized bilaterally. This revealed no purulence or significant secretions that might be cultured. There was blood clot  bilaterally, when suctioned no epistaxis or bright red blood was noted. The visualized mucosa of the septum was intact. The scope was then slowly withdrawn and the patient tolerated the procedure well. There were no complications or blood loss.  Studies Reviewed: CT neck w/con   Assessment/Plan: Encounter Diagnoses  Name Primary?   Epistaxis Yes   Nasal polyps    Chronic sinusitis, unspecified location    Post-nasal drainage    Chronic nasal congestion    Nasal septal deviation         Chronic nasal congestion and nasal obstruction, evidence of nasal polyps b/l on nasal endoscopy today with post-nasal drainage and mucosal edema, concern for chronic sinusitis. Will initiate medical management below and obtain CT sinuses  - I discussed with the patient that his sx are likely 2/2 undiagnosed chronic environmental allergies  - CBC with diff + total IgE to complete workup for CRSwNP -I reviewed CT neck he had back in March of this year and there is evidence of mucosal thickening bilaterally maxillary sinuses concerning for chronic sinus disease - nasal saline rinses  2. Anterior neck lump - palpable  round left anterior neck mass, ~ 1-2 cm in size, along left mid SCM border on exam, I reviewed CT neck he had back in March and there was no evidence of lymphadenopathy or suspicious masses back then - neck soft tissue U/S to  better evaluate -Will consider repeat CT neck if needed -Differential diagnosis includes cervical lymph node versus lipoma versus other pathology -he does have a remote history of smoking  3.  Chronic dysphagia -patient with longstanding chronic symptoms of dysphagia limited to solids and pills, I reviewed results of his recent modified barium swallow and esophagram which showed mild pharyngoesophageal dysphagia per SLP report no aspiration, images of the swallow study also demonstrate a prominent osteophyte which I suspect could be one of the reasons for dysphagia symptoms, although esophagram is somewhat limited in the segment of UES, I did not appreciate prominent CP bar.  -Flexible laryngoscopy portion of the exam today with evidence of minimal secretions along hypopharynx, bilateral vocal folds were mobile, mildly atrophic, there was evidence of moderate postcricoid edema/pachydermia -I discussed exam findings with the patient and we will plan to consider swallow therapy in the future as well as a consultation with spine surgery -At this time he does not have weight loss or any evidence of aspiration, although could benefit from swallow therapy and if candidate for osteophyte removal, surgical management of cervical osteophyte could improve his symptoms. -Will further discuss when he returns after imaging  4. GERD LPR -evidence of findings consistent with GERD LPR on flexible laryngoscopy today which could also contribute to sensation of phlegm and lump in his throat and dysphagia symptoms - Famotidine 20 mg BID  - reflux gourmet trial -diet lifestyle changes to minimize reflux    - RTC after testing   Update 04/28/23 CT sinuses with evidence of mucus retention cysts in maxillary sinuses, otherwise no evidence of CRS, also had evidence of septal deviation and ITH and what appears like nasal polyps along b/l nasal passages, which is c/w with his nasal endoscopy.   Chronic nasal  congestion/obstruction and evidence of nasal polyps and NSD/ITH on CT sinuses and repeat nasal endoscopy today  - CT findings c/w NSD/ITH and b/l nasal polyps - will use for image guidance if he elects to proceed with surgery  - he did not complete labs (CBC/diff and total IgE) - we discussed management of chronic nasal congestion and risks and benefits of surgical removal of nasal polyps/septoplasty/ITR and he is interested - will refer to Allergy for Dupixent consultation to prevent polyp recurrence  - start nasal saline/steroid rinses - Rx sent for mometasone (will stop Flonase) - continue Clarinex 5 mg daily   Chronic dysphagia - on regular/thin liquid diet right now - patient with longstanding chronic symptoms of dysphagia limited to solids and pills, I reviewed results of his recent modified barium swallow and esophagram which showed mild pharyngoesophageal dysphagia per SLP report no aspiration, images of the swallow study also demonstrate a prominent osteophyte which I suspect could be one of the reasons for dysphagia symptoms, although esophagram is somewhat limited in the segment of UES, I did not appreciate prominent CP bar.  -Flexible laryngoscopy during the initial consultation with evidence of minimal secretions along hypopharynx, bilateral vocal folds were mobile, mildly atrophic, there was evidence of moderate postcricoid edema/pachydermia - will consider consulting Spine Surgery if dysphagia sx worsen (2/2 evidence of cervical osteophyte - will proceed with swallow therapy referral   3. Neck lipoma  - neck U/S with  evidence of 2.6 cm subcutaneous nodule (same density as surrounding fat) c/w likely lipoma - he denies any recent changes in size, pain or numbness/sx from the mass, has had it for a long time - will observe and repeat imaging if grows over time or if he develops sx - had flexible scope without evidence of mass/tumor during his last office visit   4. Hx of environmental  allergies  - schedule Allergy consultation and medical management as above  5. Hx of smoking, quit several years ago, reports an episode of hypoxia in the setting of URI, requiring admission to the hospital for a few days   - Pulmonary consultation to rule out lung disease such as reactive airway disease or COPD   6. Chronic GERD LPR - continue Famotidine 20 mg daily (refill sent) and reflux gourmet   Update 08/04/23 Assessment and Plan    CRS with Bilateral Nasal Polyps Chronic bilateral nasal polyps causing  chronic nasal congestion and anosmia. Current treatment with nasal steroid rinses has provided some symptom relief. Persistent polyps on nasal endoscopy today. Discussed surgery versus medical management with Dupixent. Surgery aims to improve nasal airflow and facilitate better rinsing/remove nasal polyps. Dupixent is for preventing polyp regrowth. The patient opted for surgical removal with post-op Dupixent. - Schedule for b/l nasal endoscopy with polyp removal, possible bilateral maxillary antrostomy possible septoplasty and inferior turbinate reduction - Fax cardiology clearance request  - already seen and had repeat Echo, which appears without major changes - Continue nasal steroid rinses  Chronic nasal congestion and environmental allergies Dupixent recommended by allergy specialist to prevent polyp regrowth. Patient has arranged a payment plan with insurance.  - Continue current allergy management - proceed with Dupixent therapy - continue nasal steroid rinses - continue daily Clarinex 5 mg   Follow-up - Await call from surgery scheduling team - Follow up with cardiologist for surgical clearance.   Update 08/26/2023 Assessment and Plan   Epistaxis  Epistaxis post-septoplasty/ITR and nasal polypectomy 1 week ago, mainly left side, but resolved 24 hrs ago.   No active bleeding observed on exam today after Ralph Leyden Splints have been removed, blood clot suctioned.. No evidence of  posterior drainage c/w persistent epistaxis. Advised against nasal irrigation for now to prevent clot dislodgement and help with healing. Continue with Ocean Spray for 2-3 days and if no epistaxis, ok to start doing nasal saline rinses  - Avoid nasal irrigation for 2-3 days then resume nasal saline rinses  - Complete antibiotic/steroid course - Avoid activities that raise blood pressure or increase head pressure (e.g., heavy lifting, using a straw, sneezing with a closed mouth) - Use saline sprays and apply Vaseline to nasal passages to prevent dryness - Gargle with ice-cold water to clear blood from the throat - Follow-up as scheduled next week or return on Friday in am if epistaxis returns  Follow-up - Call on-call line if persistent bleeding occurs after hours       Ashok Croon, MD Otolaryngology [facility] Phone: [phone] Fax: [phone]    08/26/2023, 1:27 PM

## 2023-08-28 ENCOUNTER — Other Ambulatory Visit: Payer: Self-pay | Admitting: Allergy

## 2023-09-03 ENCOUNTER — Encounter (INDEPENDENT_AMBULATORY_CARE_PROVIDER_SITE_OTHER): Payer: Self-pay | Admitting: Otolaryngology

## 2023-09-03 ENCOUNTER — Ambulatory Visit (INDEPENDENT_AMBULATORY_CARE_PROVIDER_SITE_OTHER): Payer: Medicare Other | Admitting: Otolaryngology

## 2023-09-03 VITALS — BP 154/72 | HR 76

## 2023-09-03 DIAGNOSIS — J343 Hypertrophy of nasal turbinates: Secondary | ICD-10-CM

## 2023-09-03 DIAGNOSIS — J329 Chronic sinusitis, unspecified: Secondary | ICD-10-CM

## 2023-09-03 DIAGNOSIS — J3489 Other specified disorders of nose and nasal sinuses: Secondary | ICD-10-CM

## 2023-09-03 DIAGNOSIS — Z9889 Other specified postprocedural states: Secondary | ICD-10-CM

## 2023-09-03 DIAGNOSIS — J342 Deviated nasal septum: Secondary | ICD-10-CM

## 2023-09-03 DIAGNOSIS — R0982 Postnasal drip: Secondary | ICD-10-CM

## 2023-09-03 DIAGNOSIS — J339 Nasal polyp, unspecified: Secondary | ICD-10-CM

## 2023-09-03 DIAGNOSIS — R040 Epistaxis: Secondary | ICD-10-CM | POA: Diagnosis not present

## 2023-09-03 DIAGNOSIS — R0981 Nasal congestion: Secondary | ICD-10-CM

## 2023-09-03 NOTE — Progress Notes (Signed)
 ENT Progress Note:   Update 09/03/2023  Discussed the use of AI scribe software for clinical note transcription with the patient, who gave verbal consent to proceed.  History of Present Illness   Scott Dodson "Scott Dodson" is a 73 year old male 2 weeks post-op following septoplasty/ITR and nasal polypectomy,  here for post-op f/u   His epistaxis resolved. He is doing nasal saline rinses currently, no issues with that. Able to breath through his nose    Records Reviewed:  Initial Evaluation  Update 08/04/23 Discussed the use of AI scribe software for clinical note transcription with the patient, who gave verbal consent to proceed.  History of Present Illness   The patient is a 73 year old male with hx of chronic nasal congestion and bilateral nasal polyps who presents for f/u.  He has been doing steroid nasal rinses, including a device that allows rinsing in both directions similar to Navage, which improved their nasal congestion, sense of smell, and ease of breathing through the nose.  They have arranged a payment plan with their insurance company to be able to start Dupixent. They have a history of a heart attack in 2011 and have undergone heart checks, including echocardiograms, with no recent issues. They have a cardiologist, Dr. Clifton James, and had seen their team for preop clearance already and had Echo done.     Cariology clearance note 05/19/23 Preoperative cardiac evaluation:   According to the Revised Cardiac Risk Index (RCRI), his Perioperative Risk of Major Cardiac Event is (%): 0.4   His Functional Capacity in METs is: 7.66 according to the Duke Activity Status Index (DASI).    Per office protocol, if patient is without any new symptoms or concerns at the time of their virtual visit, he/she may hold ASA for 7 days prior to procedure. Please resume ASA as soon as possible postprocedure, at the discretion of the surgeon.     However due to to history of CAD hypertension and diabetes, I  am going to repeat his echocardiogram for any changes in LV function prior to clearing for surgery.  If echocardiogram is normal we will release him to move forward with his planned procedure.    2.  Coronary artery disease: Status post STEMI in 2011 with DES to the ostium of the obtuse marginal of the circumflex.   3.  Hypercholesterolemia: Followed by primary care provider.  Goal of LDL less than 70.   4.  Hypertension: Currently well-controlled on medication regimen.  Primary care provider is managing labs.   Mr. Mozingo would like to be reestablished with Dr. Clifton James as he was his primary cardiologist in the past.  We will make sure he has a follow-up appointment with him in a year to 18 months to continue relationship.  Update 04/28/23: he returns for follow-up after neck U/S and MBS//esophagram CT sinuses. Reports no changes in the size of neck lump, denies pain or skin changes. Swallowing function is stable, on regular diet. Continues to have nasal congestion, and started nasal saline rinses. On antihistamine. On Famotidine.   Initial consult 03/13/23:   Reason for Consult: neck mass and dysphagia  nasal congestion phlegm   HPI: Scott Dodson is an 72 y.o. male with hx of MI in 2011, history of diabetes, former smoking, quit in 70s, right TM reconstruction due to history of chronic ear infections performed, who is here for evaluation of longstanding chronic dysphagia symptoms and left anterior neck mass.   He is here for chronic dysphagia  symptoms primarily with solids. He reports no issues with liquids. When he is eating solid foods it would get stuck in his throat. Eventually it all goes down. He has food regurgitation (undigested). Usually bread is hardest for him. Foods with particles and dry foods cause him to cough and choke.  This has been a problem for over 4-5 yrs.  He reports persistent phlegm in the back of his throat.  He also reports chronic nasal congestion and symptoms of  difficulties with breathing through his nose.  He was evaluated by Dr. Ernestene Kiel, and had MBS and esophagram.  He also had CT neck.  I reviewed all imaging and summarized findings in assessment and plan.   She reports longstanding history of a lump along the left anterior neck, that did not bother him, but was noted by one of the physicians who saw him in the past, denies pain, denies significant changes in size of the lump. Of note CT soft tissue neck did not mention any lymphadenopathy or any evidence of anterior left neck mass that was performed on September 25, 2022.   Records Reviewed:  Dr Harvie Junior note from 11/04/22 Scott Dodson is a 73 y.o. year old male who presents for evaluation of a left neck nodule.  The patient reports a non-painful lump on the left side of his neck, which he first noticed approximately a year ago. The nodule fluctuates in size, occasionally decreasing in size. He denies experiencing fevers, chills, night sweats, or weight loss.  The patient experiences dysphagia, particularly when consuming tablets and solids. He describes a sensation of food becoming lodged in his throat. He has not yet scheduled his swallow study ordered by Gastroenterology. He denies experiencing reflux. Depending on the type of food consumed, he experiences choking and coughing.  He has no history of sinus surgery. He denies significant nasal congestion and generally breathes through his nose without difficulty. He experiences facial pain and pressure during colds. His olfactory function is normal. He experiences persistent throat drainage. He has Flonase available for use. He suffers from numerous allergies.  He smoked a long time ago. He drinks 2 alcoholic beverages per night. He is retired from his main job and now works part-time. He works for [employer].   GI records Dr. Amada Jupiter 08/30/22 Clinical details of this patient's symptoms are in my July 03, 2022 office consult note. About  2 years of chronic solid food dysphagia. Scott Dodson reports having noticed a lump in the left side of his neck for the last few weeks.    EGD   The examined esophagus was normal. The scope was                            withdrawn. Dilation was performed with a Maloney                            dilator with no resistance at 52 Fr.                           The stomach was normal.                           The cardia and gastric fundus were normal on  retroflexion. (Hill grade 1)                           The examined duodenum was normal.                           The larynx could not be well-visualized. Complications:            No immediate complications.   Past Medical History:  Diagnosis Date   Allergy    Asthma    Coronary artery disease    s/p lateral MI 4 /11, s/p Promus DES OM2   Diabetes mellitus without complication (HCC)    Heart murmur    History of pneumonia    Hyperlipidemia    Hypertension    Myocardial infarct [facility]) 2011    Past Surgical History:  Procedure Laterality Date   ADENOIDECTOMY     COLONOSCOPY     CORONARY STENT PLACEMENT  2011   MAXILLARY ANTROSTOMY Bilateral 08/20/2023   Procedure: MAXILLARY ANTROSTOMY;  Surgeon: Ashok Croon, MD;  Location: [facility] OR;  Service: ENT;  Laterality: Bilateral;   NASAL ENDOSCOPY Bilateral 08/20/2023   Procedure: NASAL ENDOSCOPY;  Surgeon: Ashok Croon, MD;  Location: [facility] OR;  Service: ENT;  Laterality: Bilateral;   POLYPECTOMY Bilateral 08/20/2023   Procedure: POLYPECTOMY NASAL;  Surgeon: Ashok Croon, MD;  Location: [facility] OR;  Service: ENT;  Laterality: Bilateral;   SEPTOPLASTY N/A 08/20/2023   Procedure: SEPTOPLASTY;  Surgeon: Ashok Croon, MD;  Location: [facility] OR;  Service: ENT;  Laterality: N/A;   SINUS ENDO WITH FUSION N/A 08/20/2023   Procedure: SINUS ENDO WITH FUSION;  Surgeon: Ashok Croon, MD;  Location: [facility] OR;  Service: ENT;  Laterality: N/A;   TONSILLECTOMY     TURBINATE REDUCTION  Bilateral 08/20/2023   Procedure: TURBINATE REDUCTION;  Surgeon: Ashok Croon, MD;  Location: [facility] OR;  Service: ENT;  Laterality: Bilateral;   TYMPANIC MEMBRANE REPAIR Right 2004   UPPER GASTROINTESTINAL ENDOSCOPY     VASECTOMY      Family History  Problem Relation Age of Onset   Heart disease Mother    Hypertension Mother    Heart disease Father    Hypertension Father    Heart disease Brother    Heart disease Sister    Colon cancer Maternal Grandfather 32   Colon polyps Neg Hx    Esophageal cancer Neg Hx    Stomach cancer Neg Hx    Rectal cancer Neg Hx     Social History:  reports that he quit smoking about 48 years ago. His smoking use included cigarettes. He started smoking about 53 years ago. He has a 2.5 pack-year smoking history. He has never used smokeless tobacco. He reports current alcohol use of about 5.0 standard drinks of alcohol per week. He reports that he does not use drugs.  Allergies: No Known Allergies  Medications: I have reviewed the patient's current medications.  The PMH, PSH, Medications, Allergies, and SH were reviewed and updated.  ROS: Constitutional: Negative for fever, weight loss and weight gain. Cardiovascular: Negative for chest pain and dyspnea on exertion. Respiratory: Is not experiencing shortness of breath at rest. Gastrointestinal: Negative for nausea and vomiting. Neurological: Negative for headaches. Psychiatric: The patient is not nervous/anxious  Blood pressure (!) 154/72, pulse 76, SpO2 96%.  PHYSICAL EXAM:  Exam: General: Well-developed, well-nourished Respiratory Respiratory effort: Equal inspiration and expiration without stridor Cardiovascular Peripheral Vascular: Warm  extremities with equal color/perfusion Eyes: No nystagmus with equal extraocular motion bilaterally Neuro/Psych/Balance: Patient oriented to person, place, and time; Appropriate mood and affect; Gait is intact with no imbalance; Cranial nerves I-XII are  intact Head and Face Inspection: Normocephalic and atraumatic without mass or lesion Facial Strength: Facial motility symmetric and full bilaterally ENT Pinna: External ear intact and fully developed External canal: Canal is patent with intact skin Tympanic Membrane: Clear and mobile External Nose: No scar or anatomic deformity Internal Nose: Septal incision healed well, crusting bilaterally, removed using nasal endoscope, Blakesley and suction, bilateral middle turbinates appear medialized, no active epistaxis, no septal perforation   Lips, Teeth, and gums: Mucosa and teeth intact and viable, missing teeth on exam Oral cavity/oropharynx: No erythema or exudate, no lesions present No active bleeding along posterior pharynx Nasopharynx: No mass or lesion with intact mucosa  Neck Neck and Trachea: Midline trachea without mass or lesion.  Procedure: PROCEDURE NOTE: nasal endoscopy and debridement  Preoperative diagnosis: epistaxis and hx of septoplasty/polypectomy/ITR 2 weeks ago ago    Postoperative diagnosis: same   Procedure: Diagnostic nasal endoscopy (09811) + post-op debridement (91478)  Surgeon: Ashok Croon, M.D.  Anesthesia: Topical lidocaine and Afrin  H&P REVIEW: The patient's history and physical were reviewed today prior to procedure. All medications were reviewed and updated as well. Complications: None Condition is stable throughout exam Indications and consent: The patient presents with symptoms of chronic sinusitis not responding to previous therapies. All the risks, benefits, and potential complications were reviewed with the patient preoperatively and informed consent was obtained. The time out was completed with confirmation of the correct procedure.   Procedure: The patient was seated upright in the clinic. Topical lidocaine and Afrin were applied to the nasal cavity. After adequate anesthesia had occurred, the rigid nasal endoscope was passed into the nasal  cavity. The nasal mucosa, turbinates, septum,  were visualized bilaterally. This revealed minimal purulence secretions along anterior septum/inferior turbinate and middle meatus/middle turbinates b/l. Blood tinged crusting bilaterally was removed using nasal endoscope, Blakesley and suction. No epistaxis or bright red blood was noted. The visualized mucosa of the septum was intact. The scope was then slowly withdrawn and the patient tolerated the procedure well. There were no complications or blood loss.  Studies Reviewed: CT neck w/con   Assessment/Plan: Encounter Diagnoses  Name Primary?   Epistaxis Yes   Nasal polyps    Post-nasal drainage    Chronic nasal congestion    Nasal septal deviation    Chronic sinusitis, unspecified location    Hypertrophy of both inferior nasal turbinates    Nasal obstruction      Update 09/03/2023 Assessment and Plan Assessment and Plan     Chronic nasal congestion and hx of NSD/ITH and b/l nasal polyps 2 weeks post-op now, healing as expected. Finished abx.   Epistaxis - resolved  There is no active bleeding, but evidence of crusting and clots from previous episodes near the polyp area, which has since healed. We debrided the crusting today to help with healing and irrigations.  - Continue nasal irrigation twice daily to aid in crusting resolution and healing. - Re-evaluate with nasal endoscopy in two to three weeks. - Advise to return sooner if significant epistaxis or other concerning symptoms occur.  Nasal polyps Dupixent is considered for long-term management of inflammation and polyp regrowth. - Schedule a follow-up appointment with Allergy in one month to evaluate the need for Dupixent for long-term management of inflammation and polyp regrowth.  Ashok Croon, MD Otolaryngology [facility] Phone: [phone] Fax: [phone]    09/03/2023, 5:39 PM

## 2023-09-09 ENCOUNTER — Other Ambulatory Visit: Payer: Self-pay | Admitting: Emergency Medicine

## 2023-09-09 DIAGNOSIS — E1165 Type 2 diabetes mellitus with hyperglycemia: Secondary | ICD-10-CM

## 2023-09-09 DIAGNOSIS — I255 Ischemic cardiomyopathy: Secondary | ICD-10-CM

## 2023-09-09 DIAGNOSIS — E1159 Type 2 diabetes mellitus with other circulatory complications: Secondary | ICD-10-CM

## 2023-09-10 ENCOUNTER — Other Ambulatory Visit: Payer: Self-pay | Admitting: Emergency Medicine

## 2023-09-10 DIAGNOSIS — E113391 Type 2 diabetes mellitus with moderate nonproliferative diabetic retinopathy without macular edema, right eye: Secondary | ICD-10-CM | POA: Diagnosis not present

## 2023-09-10 DIAGNOSIS — I10 Essential (primary) hypertension: Secondary | ICD-10-CM

## 2023-09-10 DIAGNOSIS — H2513 Age-related nuclear cataract, bilateral: Secondary | ICD-10-CM | POA: Diagnosis not present

## 2023-09-10 DIAGNOSIS — H40023 Open angle with borderline findings, high risk, bilateral: Secondary | ICD-10-CM | POA: Diagnosis not present

## 2023-09-10 DIAGNOSIS — E1169 Type 2 diabetes mellitus with other specified complication: Secondary | ICD-10-CM

## 2023-09-10 DIAGNOSIS — E1165 Type 2 diabetes mellitus with hyperglycemia: Secondary | ICD-10-CM

## 2023-09-10 DIAGNOSIS — E113312 Type 2 diabetes mellitus with moderate nonproliferative diabetic retinopathy with macular edema, left eye: Secondary | ICD-10-CM | POA: Diagnosis not present

## 2023-09-10 DIAGNOSIS — E1159 Type 2 diabetes mellitus with other circulatory complications: Secondary | ICD-10-CM

## 2023-09-10 LAB — HM DIABETES EYE EXAM

## 2023-10-02 ENCOUNTER — Ambulatory Visit (INDEPENDENT_AMBULATORY_CARE_PROVIDER_SITE_OTHER): Admitting: Otolaryngology

## 2023-10-02 ENCOUNTER — Encounter (INDEPENDENT_AMBULATORY_CARE_PROVIDER_SITE_OTHER): Payer: Self-pay | Admitting: Otolaryngology

## 2023-10-02 VITALS — BP 144/78 | HR 54 | Ht 69.0 in | Wt 197.0 lb

## 2023-10-02 DIAGNOSIS — J343 Hypertrophy of nasal turbinates: Secondary | ICD-10-CM

## 2023-10-02 DIAGNOSIS — J329 Chronic sinusitis, unspecified: Secondary | ICD-10-CM

## 2023-10-02 DIAGNOSIS — R0982 Postnasal drip: Secondary | ICD-10-CM

## 2023-10-02 DIAGNOSIS — J339 Nasal polyp, unspecified: Secondary | ICD-10-CM | POA: Diagnosis not present

## 2023-10-02 DIAGNOSIS — J3489 Other specified disorders of nose and nasal sinuses: Secondary | ICD-10-CM

## 2023-10-02 DIAGNOSIS — Z9889 Other specified postprocedural states: Secondary | ICD-10-CM

## 2023-10-02 DIAGNOSIS — R0981 Nasal congestion: Secondary | ICD-10-CM

## 2023-10-02 DIAGNOSIS — J4520 Mild intermittent asthma, uncomplicated: Secondary | ICD-10-CM

## 2023-10-02 DIAGNOSIS — J342 Deviated nasal septum: Secondary | ICD-10-CM

## 2023-10-02 MED ORDER — CETIRIZINE HCL 10 MG PO TABS
10.0000 mg | ORAL_TABLET | Freq: Every day | ORAL | 11 refills | Status: DC
Start: 2023-10-02 — End: 2024-04-05

## 2023-10-02 MED ORDER — FAMOTIDINE 20 MG PO TABS: 20.0000 mg | ORAL_TABLET | Freq: Two times a day (BID) | ORAL | 1 refills | Status: AC

## 2023-10-02 NOTE — Progress Notes (Signed)
 ENT Progress Note:   Update 10/02/2023  Discussed the use of AI scribe software for clinical note transcription with the patient, who gave verbal consent to proceed.  History of Present Illness   Scott Dodson "Scott Dodson" is a 73 year old male 2.5 months post-op following septoplasty/ITR and nasal polypectomy,  here for post-op f/u  Reports doing nasal saline rinses with steroid. Feels nasal breathing improved.    Records Reviewed:  Initial Evaluation  Update 08/04/23 Discussed the use of AI scribe software for clinical note transcription with the patient, who gave verbal consent to proceed.  History of Present Illness   The patient is a 73 year old male with hx of chronic nasal congestion and bilateral nasal polyps who presents for f/u.  He has been doing steroid nasal rinses, including a device that allows rinsing in both directions similar to Navage, which improved their nasal congestion, sense of smell, and ease of breathing through the nose.  They have arranged a payment plan with their insurance company to be able to start Dupixent. They have a history of a heart attack in 2011 and have undergone heart checks, including echocardiograms, with no recent issues. They have a cardiologist, Dr. Clifton James, and had seen their team for preop clearance already and had Echo done.    Update 04/28/23: he returns for follow-up after neck U/S and MBS//esophagram CT sinuses. Reports no changes in the size of neck lump, denies pain or skin changes. Swallowing function is stable, on regular diet. Continues to have nasal congestion, and started nasal saline rinses. On antihistamine. On Famotidine.   Initial consult 03/13/23:   Reason for Consult: neck mass and dysphagia  nasal congestion phlegm   HPI: Scott Dodson is an 73 y.o. male with hx of MI in 2011, history of diabetes, former smoking, quit in 70s, right TM reconstruction due to history of chronic ear infections performed, who is here for evaluation of  longstanding chronic dysphagia symptoms and left anterior neck mass.   He is here for chronic dysphagia symptoms primarily with solids. He reports no issues with liquids. When he is eating solid foods it would get stuck in his throat. Eventually it all goes down. He has food regurgitation (undigested). Usually bread is hardest for him. Foods with particles and dry foods cause him to cough and choke.  This has been a problem for over 4-5 yrs.  He reports persistent phlegm in the back of his throat.  He also reports chronic nasal congestion and symptoms of difficulties with breathing through his nose.  He was evaluated by Dr. Ernestene Kiel, and had MBS and esophagram.  He also had CT neck.  I reviewed all imaging and summarized findings in assessment and plan.   She reports longstanding history of a lump along the left anterior neck, that did not bother him, but was noted by one of the physicians who saw him in the past, denies pain, denies significant changes in size of the lump. Of note CT soft tissue neck did not mention any lymphadenopathy or any evidence of anterior left neck mass that was performed on September 25, 2022.   Records Reviewed:  Dr Harvie Junior note from 11/04/22 Scott Dodson is a 73 y.o. year old male who presents for evaluation of a left neck nodule.  The patient reports a non-painful lump on the left side of his neck, which he first noticed approximately a year ago. The nodule fluctuates in size, occasionally decreasing in size. He denies experiencing fevers, chills,  night sweats, or weight loss.  The patient experiences dysphagia, particularly when consuming tablets and solids. He describes a sensation of food becoming lodged in his throat. He has not yet scheduled his swallow study ordered by Gastroenterology. He denies experiencing reflux. Depending on the type of food consumed, he experiences choking and coughing.  He has no history of sinus surgery. He denies significant nasal congestion and  generally breathes through his nose without difficulty. He experiences facial pain and pressure during colds. His olfactory function is normal. He experiences persistent throat drainage. He has Flonase available for use. He suffers from numerous allergies.  He smoked a long time ago. He drinks 2 alcoholic beverages per night. He is retired from his main job and now works part-time. He works for [employer].   GI records Dr. Amada Jupiter 08/30/22 Clinical details of this patient's symptoms are in my July 03, 2022 office consult note. About 2 years of chronic solid food dysphagia. Scott Dodson reports having noticed a lump in the left side of his neck for the last few weeks.    EGD   The examined esophagus was normal. The scope was                            withdrawn. Dilation was performed with a Maloney                            dilator with no resistance at 52 Fr.                           The stomach was normal.                           The cardia and gastric fundus were normal on                            retroflexion. (Hill grade 1)                           The examined duodenum was normal.                           The larynx could not be well-visualized. Complications:            No immediate complications.   Past Medical History:  Diagnosis Date   Allergy    Asthma    Coronary artery disease    s/p lateral MI 4 /11, s/p Promus DES OM2   Diabetes mellitus without complication (HCC)    Heart murmur    History of pneumonia    Hyperlipidemia    Hypertension    Myocardial infarct [facility]) 2011    Past Surgical History:  Procedure Laterality Date   ADENOIDECTOMY     COLONOSCOPY     CORONARY STENT PLACEMENT  2011   MAXILLARY ANTROSTOMY Bilateral 08/20/2023   Procedure: MAXILLARY ANTROSTOMY;  Surgeon: Ashok Croon, MD;  Location: [facility] OR;  Service: ENT;  Laterality: Bilateral;   NASAL ENDOSCOPY Bilateral 08/20/2023   Procedure: NASAL ENDOSCOPY;  Surgeon: Ashok Croon, MD;  Location: [facility] OR;  Service: ENT;  Laterality: Bilateral;   POLYPECTOMY Bilateral 08/20/2023   Procedure: POLYPECTOMY NASAL;  Surgeon: Ashok Croon,  MD;  Location: [facility] OR;  Service: ENT;  Laterality: Bilateral;   SEPTOPLASTY N/A 08/20/2023   Procedure: SEPTOPLASTY;  Surgeon: Ashok Croon, MD;  Location: [facility] OR;  Service: ENT;  Laterality: N/A;   SINUS ENDO WITH FUSION N/A 08/20/2023   Procedure: SINUS ENDO WITH FUSION;  Surgeon: Ashok Croon, MD;  Location: [facility] OR;  Service: ENT;  Laterality: N/A;   TONSILLECTOMY     TURBINATE REDUCTION Bilateral 08/20/2023   Procedure: TURBINATE REDUCTION;  Surgeon: Ashok Croon, MD;  Location: [facility] OR;  Service: ENT;  Laterality: Bilateral;   TYMPANIC MEMBRANE REPAIR Right 2004   UPPER GASTROINTESTINAL ENDOSCOPY     VASECTOMY      Family History  Problem Relation Age of Onset   Heart disease Mother    Hypertension Mother    Heart disease Father    Hypertension Father    Heart disease Brother    Heart disease Sister    Colon cancer Maternal Grandfather 44   Colon polyps Neg Hx    Esophageal cancer Neg Hx    Stomach cancer Neg Hx    Rectal cancer Neg Hx     Social History:  reports that he quit smoking about 49 years ago. His smoking use included cigarettes. He started smoking about 54 years ago. He has a 2.5 pack-year smoking history. He has never used smokeless tobacco. He reports current alcohol use of about 5.0 standard drinks of alcohol per week. He reports that he does not use drugs.  Allergies: No Known Allergies  Medications: I have reviewed the patient's current medications.  The PMH, PSH, Medications, Allergies, and SH were reviewed and updated.  ROS: Constitutional: Negative for fever, weight loss and weight gain. Cardiovascular: Negative for chest pain and dyspnea on exertion. Respiratory: Is not experiencing shortness of breath at rest. Gastrointestinal: Negative for nausea and vomiting. Neurological: Negative  for headaches. Psychiatric: The patient is not nervous/anxious  Blood pressure (!) 144/78, pulse (!) 54, height 5\' 9"  (1.753 m), weight 197 lb (89.4 kg), SpO2 96%.  PHYSICAL EXAM:  Exam: General: Well-developed, well-nourished Respiratory Respiratory effort: Equal inspiration and expiration without stridor Cardiovascular Peripheral Vascular: Warm extremities with equal color/perfusion Eyes: No nystagmus with equal extraocular motion bilaterally Neuro/Psych/Balance: Patient oriented to person, place, and time; Appropriate mood and affect; Gait is intact with no imbalance; Cranial nerves I-XII are intact Head and Face Inspection: Normocephalic and atraumatic without mass or lesion Facial Strength: Facial motility symmetric and full bilaterally ENT Pinna: External ear intact and fully developed External canal: Canal is patent with intact skin Tympanic Membrane: Clear and mobile External Nose: No scar or anatomic deformity Internal Nose: Septal incision healed well, crusting bilaterally, removed using nasal endoscope, Blakesley and suction, bilateral middle turbinates appear medialized, no active epistaxis, no septal perforation   Lips, Teeth, and gums: Mucosa and teeth intact and viable, missing teeth on exam Oral cavity/oropharynx: No erythema or exudate, no lesions present No active bleeding along posterior pharynx Nasopharynx: No mass or lesion with intact mucosa  Neck Neck and Trachea: Midline trachea without mass or lesion.  Procedure: PROCEDURE NOTE: nasal endoscopy   Preoperative diagnosis: s/p septoplasty ITR and nasal polypectomy    Postoperative diagnosis: same   Procedure: Diagnostic nasal endoscopy (19147)   Findings: healed nearly completely, white thick secretions along left middle meatus, R maxillary antrostomy patent without purulence noted on the right side, mild mucosal edema, nasal passages patent no epistaxis  Surgeon: Ashok Croon, M.D.  Anesthesia:  Topical lidocaine and  Afrin  H&P REVIEW: The patient's history and physical were reviewed today prior to procedure. All medications were reviewed and updated as well. Complications: None Condition is stable throughout exam Indications and consent: The patient presents with symptoms of chronic sinusitis not responding to previous therapies. All the risks, benefits, and potential complications were reviewed with the patient preoperatively and informed consent was obtained. The time out was completed with confirmation of the correct procedure.   Procedure: The patient was seated upright in the clinic. Topical lidocaine and Afrin were applied to the nasal cavity. After adequate anesthesia had occurred, the rigid nasal endoscope was passed into the nasal cavity. The nasal mucosa, turbinates, septum,  were visualized bilaterally. This revealed minimal purulence secretions along anterior septum/inferior turbinate and middle meatus/middle turbinates b/l. Blood tinged crusting bilaterally was removed using nasal endoscope, Blakesley and suction. No epistaxis or bright red blood was noted. The visualized mucosa of the septum was intact. The scope was then slowly withdrawn and the patient tolerated the procedure well. There were no complications or blood loss.  Studies Reviewed: CT neck w/con   Assessment/Plan: Encounter Diagnoses  Name Primary?   Nasal polyps Yes   Post-nasal drainage    Nasal septal deviation    Chronic sinusitis, unspecified location    Chronic nasal congestion    Hypertrophy of both inferior nasal turbinates    Nasal obstruction    Mild intermittent asthma without complication      Update 10/02/2023 Assessment and Plan    Chronic nasal congestion nasal polyps environmental allergies  On Budesonide rinse and Zyrtec. Dupixent recommended to prevent polyp regrowth. Has not started it yet.  - Continue budesonide nasal rinse daily. - Initiate Dupixent therapy. - Continue Zyrtec  for allergy management. - Irrigate with steroid in the morning and saline rinse at night during pollen season to remove nasal crusting and secretions. - Refill Zyrtec and Pepcid prescriptions.  Gastroesophageal reflux disease (GERD) Pepcid managing symptoms effectively. - Continue Pepcid.  - diet and lifestyle changes to minimize reflux      RTC 6 mo           Ashok Croon, MD Otolaryngology [facility] Phone: [phone] Fax: [phone]    10/02/2023, 8:12 PM

## 2023-10-23 ENCOUNTER — Other Ambulatory Visit: Payer: Self-pay | Admitting: Allergy

## 2023-10-28 DIAGNOSIS — H40023 Open angle with borderline findings, high risk, bilateral: Secondary | ICD-10-CM | POA: Diagnosis not present

## 2023-10-28 DIAGNOSIS — E113312 Type 2 diabetes mellitus with moderate nonproliferative diabetic retinopathy with macular edema, left eye: Secondary | ICD-10-CM | POA: Diagnosis not present

## 2023-10-28 DIAGNOSIS — H2513 Age-related nuclear cataract, bilateral: Secondary | ICD-10-CM | POA: Diagnosis not present

## 2023-10-28 DIAGNOSIS — E113391 Type 2 diabetes mellitus with moderate nonproliferative diabetic retinopathy without macular edema, right eye: Secondary | ICD-10-CM | POA: Diagnosis not present

## 2023-11-25 ENCOUNTER — Encounter: Payer: Self-pay | Admitting: Emergency Medicine

## 2023-11-25 ENCOUNTER — Ambulatory Visit: Payer: Self-pay | Admitting: Emergency Medicine

## 2023-11-25 ENCOUNTER — Ambulatory Visit (INDEPENDENT_AMBULATORY_CARE_PROVIDER_SITE_OTHER): Payer: Medicare Other | Admitting: Emergency Medicine

## 2023-11-25 VITALS — BP 118/80 | HR 52 | Temp 98.3°F | Ht 69.0 in | Wt 198.0 lb

## 2023-11-25 DIAGNOSIS — I255 Ischemic cardiomyopathy: Secondary | ICD-10-CM | POA: Diagnosis not present

## 2023-11-25 DIAGNOSIS — J343 Hypertrophy of nasal turbinates: Secondary | ICD-10-CM

## 2023-11-25 DIAGNOSIS — Z7984 Long term (current) use of oral hypoglycemic drugs: Secondary | ICD-10-CM | POA: Diagnosis not present

## 2023-11-25 DIAGNOSIS — E1159 Type 2 diabetes mellitus with other circulatory complications: Secondary | ICD-10-CM

## 2023-11-25 DIAGNOSIS — R1319 Other dysphagia: Secondary | ICD-10-CM

## 2023-11-25 DIAGNOSIS — E1169 Type 2 diabetes mellitus with other specified complication: Secondary | ICD-10-CM | POA: Diagnosis not present

## 2023-11-25 DIAGNOSIS — I152 Hypertension secondary to endocrine disorders: Secondary | ICD-10-CM | POA: Diagnosis not present

## 2023-11-25 DIAGNOSIS — E785 Hyperlipidemia, unspecified: Secondary | ICD-10-CM

## 2023-11-25 LAB — LIPID PANEL
Cholesterol: 127 mg/dL (ref 0–200)
HDL: 55.5 mg/dL (ref >39.00)
LDL Cholesterol: 38 mg/dL (ref 0–99)
NonHDL: 71.78
Total CHOL/HDL Ratio: 2
Triglycerides: 168 mg/dL — ABNORMAL HIGH (ref 0.0–149.0)
VLDL: 33.6 mg/dL (ref 0.0–40.0)

## 2023-11-25 LAB — COMPREHENSIVE METABOLIC PANEL WITH GFR
ALT: 20 U/L (ref 0–53)
AST: 18 U/L (ref 0–37)
Albumin: 4.6 g/dL (ref 3.5–5.2)
Alkaline Phosphatase: 52 U/L (ref 39–117)
BUN: 12 mg/dL (ref 6–23)
CO2: 28 meq/L (ref 19–32)
Calcium: 9.5 mg/dL (ref 8.4–10.5)
Chloride: 105 meq/L (ref 96–112)
Creatinine, Ser: 0.97 mg/dL (ref 0.40–1.50)
GFR: 78.92 mL/min (ref >60.00)
Glucose, Bld: 151 mg/dL — ABNORMAL HIGH (ref 70–99)
Potassium: 4.7 meq/L (ref 3.5–5.1)
Sodium: 142 meq/L (ref 135–145)
Total Bilirubin: 0.5 mg/dL (ref 0.2–1.2)
Total Protein: 7.1 g/dL (ref 6.0–8.3)

## 2023-11-25 LAB — POCT GLYCOSYLATED HEMOGLOBIN (HGB A1C): Hemoglobin A1C: 7.6 % — AB (ref 4.0–5.6)

## 2023-11-25 LAB — CBC WITH DIFFERENTIAL/PLATELET
Basophils Absolute: 0 10*3/uL (ref 0.0–0.1)
Basophils Relative: 0.7 % (ref 0.0–3.0)
Eosinophils Absolute: 0.4 10*3/uL (ref 0.0–0.7)
Eosinophils Relative: 6.8 % — ABNORMAL HIGH (ref 0.0–5.0)
HCT: 45.5 % (ref 39.0–52.0)
Hemoglobin: 15.5 g/dL (ref 13.0–17.0)
Lymphocytes Relative: 25.9 % (ref 12.0–46.0)
Lymphs Abs: 1.6 10*3/uL (ref 0.7–4.0)
MCHC: 34 g/dL (ref 30.0–36.0)
MCV: 84.9 fl (ref 78.0–100.0)
Monocytes Absolute: 0.6 10*3/uL (ref 0.1–1.0)
Monocytes Relative: 9.2 % (ref 3.0–12.0)
Neutro Abs: 3.6 10*3/uL (ref 1.4–7.7)
Neutrophils Relative %: 57.4 % (ref 43.0–77.0)
Platelets: 184 10*3/uL (ref 150.0–400.0)
RBC: 5.36 Mil/uL (ref 4.22–5.81)
RDW: 13.6 % (ref 11.5–15.5)
WBC: 6.2 10*3/uL (ref 4.0–10.5)

## 2023-11-25 LAB — MICROALBUMIN / CREATININE URINE RATIO
Creatinine,U: 114.9 mg/dL
Microalb Creat Ratio: 24.7 mg/g (ref 0.0–30.0)
Microalb, Ur: 2.8 mg/dL — ABNORMAL HIGH (ref 0.0–1.9)

## 2023-11-25 MED ORDER — DAPAGLIFLOZIN PROPANEDIOL 10 MG PO TABS: 10.0000 mg | ORAL_TABLET | Freq: Every day | ORAL | 3 refills | Status: AC

## 2023-11-25 NOTE — Progress Notes (Signed)
 Scott Dodson 73 y.o.   Chief Complaint  Patient presents with   Follow-up    6 month f/u for HTN and DM     HISTORY OF PRESENT ILLNESS: This is a 73 y.o. male A1A here for 67-month follow-up of chronic medical conditions including hypertension and diabetes Overall doing well although he has occasional fatigue and tiredness No other complaints or medical concerns today. Presently not taking Farxiga  due to cost Lab Results  Component Value Date   HGBA1C 6.6 (H) 05/26/2023   BP Readings from Last 3 Encounters:  10/02/23 (!) 144/78  09/03/23 (!) 154/72  08/26/23 (!) 155/75   Wt Readings from Last 3 Encounters:  10/02/23 197 lb (89.4 kg)  08/20/23 195 lb (88.5 kg)  06/09/23 196 lb 6.4 oz (89.1 kg)     HPI   Prior to Admission medications   Medication Sig Start Date End Date Taking? Authorizing Provider  acetaminophen  (TYLENOL ) 500 MG tablet Take 1 tablet (500 mg total) by mouth every 6 (six) hours. Take every 6 hrs x 2-3 days then take every 6 hrs as needed 08/20/23  Yes Artice Last, MD  albuterol  (VENTOLIN  HFA) 108 (90 Base) MCG/ACT inhaler Inhale 2 puffs into the lungs every 6 (six) hours as needed for wheezing or shortness of breath. 06/30/22  Yes Leona Rake, MD  amLODipine  (NORVASC ) 5 MG tablet TAKE 1 TABLET BY MOUTH DAILY 02/22/23  Yes Kamillah Didonato, Isidro Margo, MD  Ascorbic Acid (VITAMIN C) 1000 MG tablet Take 2,000 mg by mouth daily.   Yes [provider]  aspirin  81 MG tablet Take 81 mg by mouth daily.   Yes [provider]  budesonide  (PULMICORT ) 0.5 MG/2ML nebulizer solution Take 0.5 mg by nebulization daily as needed (Asthma). 05/05/23  Yes [provider]  carvedilol  (COREG ) 25 MG tablet TAKE 1 TABLET BY MOUTH TWICE  DAILY WITH MEALS 09/10/23  Yes Kemya Shed, Isidro Margo, MD  cetirizine  (ZYRTEC ) 10 MG tablet Take 1 tablet (10 mg total) by mouth daily. 10/02/23  Yes Soldatova, Liuba, MD  Coenzyme Q10 (CO Q 10 PO) Take 1 tablet by mouth daily.    Yes [provider]  famotidine  (PEPCID ) 20 MG tablet Take 1 tablet (20 mg total) by mouth 2 (two) times daily. 10/02/23  Yes Soldatova, Liuba, MD  fluticasone  (FLONASE ) 50 MCG/ACT nasal spray Place 2 sprays into both nostrils daily. Patient taking differently: Place 2 sprays into both nostrils daily as needed for allergies. 03/13/23  Yes Soldatova, Liuba, MD  fluticasone  furoate-vilanterol (BREO ELLIPTA ) 100-25 MCG/ACT AEPB INHALE 1 PUFF INTO THE LUNGS DAILY. RINSE MOUTH AFTER USE 08/28/23  Yes Trudy Fusi, DO  glipiZIDE  (GLUCOTROL ) 5 MG tablet TAKE 1 TABLET BY MOUTH DAILY  WITH BREAKFAST 09/10/23  Yes Elvira Hammersmith, MD  lisinopril  (ZESTRIL ) 40 MG tablet TAKE 1 TABLET BY MOUTH DAILY 09/10/23  Yes Dilara Navarrete Jose, MD  metFORMIN  (GLUCOPHAGE ) 500 MG tablet TAKE 1 TABLET BY MOUTH TWICE  DAILY WITH MEALS 09/10/23  Yes Cayden Granholm, Isidro Margo, MD  oxymetazoline  (AFRIN NASAL SPRAY) 0.05 % nasal spray Place 2 sprays into both nostrils 2 (two) times daily as needed for congestion. Use for epistaxis as needed avoid using for more than 48 hrs 08/26/23  Yes Soldatova, Liuba, MD  rosuvastatin  (CRESTOR ) 20 MG tablet TAKE 1 TABLET BY MOUTH DAILY 09/10/23  Yes Caycee Wanat Jose, MD  cetirizine  (ZYRTEC ) 10 MG tablet Take 10 mg by mouth daily as needed for allergies. Patient not taking: Reported on  11/25/2023    [provider]  famotidine  (PEPCID ) 20 MG tablet TAKE 1 TABLET BY MOUTH TWICE A DAY Patient not taking: Reported on 11/25/2023 07/31/23   Soldatova, Liuba, MD  FARXIGA  10 MG TABS tablet TAKE 1 TABLET BY MOUTH DAILY  BEFORE BREAKFAST Patient not taking: Reported on 10/02/2023 01/04/23   Kailin Principato Jose, MD  methylPREDNISolone  (MEDROL  DOSEPAK) 4 MG TBPK tablet Take with signs of chronic sinusitis and take as directed Patient not taking: Reported on 11/25/2023 08/20/23   Artice Last, MD  oxyCODONE  (ROXICODONE ) 5 MG immediate release tablet Take 1 tablet (5 mg total) by mouth every  6 (six) hours as needed for severe pain (pain score 7-10). Patient not taking: Reported on 09/03/2023 08/20/23   Artice Last, MD    No Known Allergies  Patient Active Problem List   Diagnosis Date Noted   Chronic nasal congestion 08/20/2023   Nasal obstruction 08/20/2023   Hypertrophy of both inferior nasal turbinates 08/20/2023   Nasal septal deviation 08/20/2023   Nasal polyps 08/20/2023   HLD (hyperlipidemia) 06/28/2022   HTN (hypertension) 06/28/2022   Ischemic cardiomyopathy 05/21/2022   Esophageal dysphagia 05/21/2022   Chronic bilateral low back pain without sciatica 05/21/2022   Chronic left hip pain 05/21/2022   Age-related nuclear cataract of both eyes 02/05/2021   Vitreomacular adhesion of left eye 12/13/2019   Severe nonproliferative diabetic retinopathy of right eye without macular edema associated with type 2 diabetes mellitus (HCC) 12/13/2019   Severe nonproliferative diabetic retinopathy of left eye, with macular edema, associated with type 2 diabetes mellitus (HCC) 12/13/2019   Vitreomacular adhesion of right eye 12/13/2019   History of diabetes mellitus 02/09/2018   CARDIOMYOPATHY, ISCHEMIC 05/18/2010   Coronary atherosclerosis 10/11/2009   Dyslipidemia associated with type 2 diabetes mellitus (HCC) 10/09/2009   HYPERLIPIDEMIA 10/09/2009   Hypertension associated with diabetes (HCC) 10/09/2009   MYOCARDIAL INFARCTION, HX OF 10/09/2009    Past Medical History:  Diagnosis Date   Allergy    Asthma    Coronary artery disease    s/p lateral MI 4 /11, s/p Promus DES OM2   Diabetes mellitus without complication (HCC)    Heart murmur    History of pneumonia    Hyperlipidemia    Hypertension    Myocardial infarct (HCC) 2011    Past Surgical History:  Procedure Laterality Date   ADENOIDECTOMY     COLONOSCOPY     CORONARY STENT PLACEMENT  2011   MAXILLARY ANTROSTOMY Bilateral 08/20/2023   Procedure: MAXILLARY ANTROSTOMY;  Surgeon: Artice Last, MD;   Location: [facility] OR;  Service: ENT;  Laterality: Bilateral;   NASAL ENDOSCOPY Bilateral 08/20/2023   Procedure: NASAL ENDOSCOPY;  Surgeon: Artice Last, MD;  Location: [facility] OR;  Service: ENT;  Laterality: Bilateral;   POLYPECTOMY Bilateral 08/20/2023   Procedure: POLYPECTOMY NASAL;  Surgeon: Artice Last, MD;  Location: [facility] OR;  Service: ENT;  Laterality: Bilateral;   SEPTOPLASTY N/A 08/20/2023   Procedure: SEPTOPLASTY;  Surgeon: Artice Last, MD;  Location: [facility] OR;  Service: ENT;  Laterality: N/A;   SINUS ENDO WITH FUSION N/A 08/20/2023   Procedure: SINUS ENDO WITH FUSION;  Surgeon: Artice Last, MD;  Location: [facility] OR;  Service: ENT;  Laterality: N/A;   TONSILLECTOMY     TURBINATE REDUCTION Bilateral 08/20/2023   Procedure: TURBINATE REDUCTION;  Surgeon: Artice Last, MD;  Location: [facility] OR;  Service: ENT;  Laterality: Bilateral;   TYMPANIC MEMBRANE REPAIR Right 2004   UPPER GASTROINTESTINAL ENDOSCOPY  VASECTOMY      Social History   Socioeconomic History   Marital status: Married    Spouse name: Not on file   Number of children: 3   Years of education: Not on file   Highest education level: Not on file  Occupational History   Occupation: Company secretary   Occupation: retired  Tobacco Use   Smoking status: Former    Current packs/day: 0.00    Average packs/day: 0.5 packs/day for 5.0 years (2.5 ttl pk-yrs)    Types: Cigarettes    Start date: 10/13/1969    Quit date: 10/14/1974    Years since quitting: 49.1   Smokeless tobacco: Never  Vaping Use   Vaping status: Never Used  Substance and Sexual Activity   Alcohol use: Yes    Alcohol/week: 5.0 standard drinks of alcohol    Types: 5 Standard drinks or equivalent per week    Comment: 1-2 drink daily   Drug use: No   Sexual activity: Yes  Other Topics Concern   Not on file  Social History Narrative   Occupation- Writer    Former -tobacco -stopped 1979   Married ,3 children     Alcohol  use - yes , social    Drug use - no   Regular exercise- yes    Social Drivers of Corporate investment banker Strain: Low Risk  (12/25/2022)   Overall Financial Resource Strain (CARDIA)    Difficulty of Paying Living Expenses: Not hard at all  Food Insecurity: No Food Insecurity (12/25/2022)   Hunger Vital Sign    Worried About Running Out of Food in the Last Year: Never true    Ran Out of Food in the Last Year: Never true  Transportation Needs: No Transportation Needs (12/25/2022)   PRAPARE - Administrator, Civil Service (Medical): No    Lack of Transportation (Non-Medical): No  Physical Activity: Sufficiently Active (12/25/2022)   Exercise Vital Sign    Days of Exercise per Week: 3 days    Minutes of Exercise per Session: 100 min  Stress: No Stress Concern Present (12/25/2022)   [employer] of Occupational Health - Occupational Stress Questionnaire    Feeling of Stress : Not at all  Social Connections: Moderately Integrated (12/25/2022)   Social Connection and Isolation Panel [NHANES]    Frequency of Communication with Friends and Family: Once a week    Frequency of Social Gatherings with Friends and Family: Once a week    Attends Religious Services: More than 4 times per year    Active Member of [employer] or Organizations: Yes    Attends Banker Meetings: 1 to 4 times per year    Marital Status: Married  Catering manager Violence: Not At Risk (12/25/2022)   Humiliation, Afraid, Rape, and Kick questionnaire    Fear of Current or Ex-Partner: No    Emotionally Abused: No    Physically Abused: No    Sexually Abused: No    Family History  Problem Relation Age of Onset   Heart disease Mother    Hypertension Mother    Heart disease Father    Hypertension Father    Heart disease Brother    Heart disease Sister    Colon cancer Maternal Grandfather 33   Colon polyps Neg Hx    Esophageal cancer Neg Hx    Stomach cancer Neg Hx    Rectal cancer Neg Hx  Review of Systems  Constitutional:  Positive for malaise/fatigue. Negative for chills and fever.  HENT: Negative.  Negative for congestion and sore throat.   Respiratory: Negative.  Negative for cough and shortness of breath.   Cardiovascular: Negative.  Negative for chest pain and palpitations.  Gastrointestinal:  Negative for abdominal pain, diarrhea, nausea and vomiting.  Genitourinary: Negative.  Negative for dysuria and hematuria.  Skin: Negative.  Negative for rash.  Neurological:  Negative for dizziness and headaches.  All other systems reviewed and are negative.   Today's Vitals   11/25/23 0806  BP: 118/80  Pulse: (!) 52  Temp: 98.3 F (36.8 C)  TempSrc: Oral  SpO2: 92%  Weight: 198 lb (89.8 kg)  Height: 5\' 9"  (1.753 m)   Body mass index is 29.24 kg/m.   Physical Exam Vitals reviewed.  Constitutional:      Appearance: Normal appearance.  HENT:     Head: Normocephalic.  Eyes:     Extraocular Movements: Extraocular movements intact.     Conjunctiva/sclera: Conjunctivae normal.     Pupils: Pupils are equal, round, and reactive to light.  Cardiovascular:     Rate and Rhythm: Normal rate and regular rhythm.     Pulses: Normal pulses.     Heart sounds: Normal heart sounds.  Pulmonary:     Effort: Pulmonary effort is normal.     Breath sounds: Normal breath sounds.  Abdominal:     Palpations: Abdomen is soft.     Tenderness: There is no abdominal tenderness.  Skin:    General: Skin is warm and dry.  Neurological:     Mental Status: He is alert and oriented to person, place, and time.  Psychiatric:        Mood and Affect: Mood normal.        Behavior: Behavior normal.    Results for orders placed or performed in visit on 11/25/23 (from the past 24 hours)  POCT glycosylated hemoglobin (Hb A1C)     Status: Abnormal   Collection Time: 11/25/23  8:35 AM  Result Value Ref Range   Hemoglobin A1C 7.6 (A) 4.0 - 5.6 %   HbA1c POC (<> result, manual entry)      HbA1c, POC (prediabetic range)     HbA1c, POC (controlled diabetic range)       ASSESSMENT & PLAN: A total of 43 minutes was spent with the patient and counseling/coordination of care regarding preparing for this visit, review of most recent office visit notes, review of multiple chronic medical conditions and their management, cardiovascular risks associated with hypertension and diabetes, review of all medications and changes made, review of most recent bloodwork results including interpretation of today's hemoglobin A1c, review of health maintenance items, education on nutrition, prognosis, documentation, and need for follow up.   Problem List Items Addressed This Visit       Cardiovascular and Mediastinum   Hypertension associated with diabetes (HCC) - Primary   BP Readings from Last 3 Encounters:  11/25/23 118/80  10/02/23 (!) 144/78  09/03/23 (!) 154/72  Well-controlled hypertension Continue amlodipine  5 mg, lisinopril  40 mg, and carvedilol  25 mg twice a day  Hemoglobin A1c higher than before 7.6, not at goal. Has been off Farxiga .  Recommend to start again Continue glipizide  5 mg twice a day and metformin  500 mg twice a day Cardiovascular risks associated with uncontrolled diabetes discussed Diet and nutrition discussed Follow-up in 6 months Blood work done today      Relevant Medications  dapagliflozin  propanediol (FARXIGA ) 10 MG TABS tablet   Other Relevant Orders   POCT glycosylated hemoglobin (Hb A1C)   Microalbumin / creatinine urine ratio   CBC with Differential/Platelet   Comprehensive metabolic panel with GFR   Lipid panel   Ischemic cardiomyopathy   Stable.  No recent anginal episodes. Continues daily baby aspirin  along with carvedilol  25 mg twice a day        Respiratory   Hypertrophy of both inferior nasal turbinates   Was able to follow-up with ENT. Underwent surgery successfully        Digestive   Esophageal dysphagia   Was able to undergo  upper endoscopy on 08/30/2023 Normal esophagus and normal stomach No signs of malignancy        Endocrine   Dyslipidemia associated with type 2 diabetes mellitus (HCC)   Uncontrolled diabetes with hemoglobin A1c of 7.6 Restart Farxiga  and continue glipizide  and metformin  Continue rosuvastatin  20 mg daily Diet and nutrition discussed      Relevant Medications   dapagliflozin  propanediol (FARXIGA ) 10 MG TABS tablet   Other Relevant Orders   POCT glycosylated hemoglobin (Hb A1C)   Microalbumin / creatinine urine ratio   CBC with Differential/Platelet   Comprehensive metabolic panel with GFR   Lipid panel   Patient Instructions  Health Maintenance After Age 92 After age 69, you are at a higher risk for certain long-term diseases and infections as well as injuries from falls. Falls are a major cause of broken bones and head injuries in people who are older than age 73. Getting regular preventive care can help to keep you healthy and well. Preventive care includes getting regular testing and making lifestyle changes as recommended by your health care provider. Talk with your health care provider about: Which screenings and tests you should have. A screening is a test that checks for a disease when you have no symptoms. A diet and exercise plan that is right for you. What should I know about screenings and tests to prevent falls? Screening and testing are the best ways to find a health problem early. Early diagnosis and treatment give you the best chance of managing medical conditions that are common after age 24. Certain conditions and lifestyle choices may make you more likely to have a fall. Your health care provider may recommend: Regular vision checks. Poor vision and conditions such as cataracts can make you more likely to have a fall. If you wear glasses, make sure to get your prescription updated if your vision changes. Medicine review. Work with your health care provider to regularly  review all of the medicines you are taking, including over-the-counter medicines. Ask your health care provider about any side effects that may make you more likely to have a fall. Tell your health care provider if any medicines that you take make you feel dizzy or sleepy. Strength and balance checks. Your health care provider may recommend certain tests to check your strength and balance while standing, walking, or changing positions. Foot health exam. Foot pain and numbness, as well as not wearing proper footwear, can make you more likely to have a fall. Screenings, including: Osteoporosis screening. Osteoporosis is a condition that causes the bones to get weaker and break more easily. Blood pressure screening. Blood pressure changes and medicines to control blood pressure can make you feel dizzy. Depression screening. You may be more likely to have a fall if you have a fear of falling, feel depressed, or feel unable to do  activities that you used to do. Alcohol use screening. Using too much alcohol can affect your balance and may make you more likely to have a fall. Follow these instructions at home: Lifestyle Do not drink alcohol if: Your health care provider tells you not to drink. If you drink alcohol: Limit how much you have to: 0-1 drink a day for women. 0-2 drinks a day for men. Know how much alcohol is in your drink. In the [place] one drink equals one 12 oz bottle of beer (355 mL), one 5 oz glass of wine (148 mL), or one 1 oz glass of hard liquor (44 mL). Do not use any products that contain nicotine or tobacco. These products include cigarettes, chewing tobacco, and vaping devices, such as e-cigarettes. If you need help quitting, ask your health care provider. Activity  Follow a regular exercise program to stay fit. This will help you maintain your balance. Ask your health care provider what types of exercise are appropriate for you. If you need a cane or walker, use it as recommended  by your health care provider. Wear supportive shoes that have nonskid soles. Safety  Remove any tripping hazards, such as rugs, cords, and clutter. Install safety equipment such as grab bars in bathrooms and safety rails on stairs. Keep rooms and walkways well-lit. General instructions Talk with your health care provider about your risks for falling. Tell your health care provider if: You fall. Be sure to tell your health care provider about all falls, even ones that seem minor. You feel dizzy, tiredness (fatigue), or off-balance. Take over-the-counter and prescription medicines only as told by your health care provider. These include supplements. Eat a healthy diet and maintain a healthy weight. A healthy diet includes low-fat dairy products, low-fat (lean) meats, and fiber from whole grains, beans, and lots of fruits and vegetables. Stay current with your vaccines. Schedule regular health, dental, and eye exams. Summary Having a healthy lifestyle and getting preventive care can help to protect your health and wellness after age 71. Screening and testing are the best way to find a health problem early and help you avoid having a fall. Early diagnosis and treatment give you the best chance for managing medical conditions that are more common for people who are older than age 40. Falls are a major cause of broken bones and head injuries in people who are older than age 57. Take precautions to prevent a fall at home. Work with your health care provider to learn what changes you can make to improve your health and wellness and to prevent falls. This information is not intended to replace advice given to you by your health care provider. Make sure you discuss any questions you have with your health care provider. Document Revised: 11/06/2020 Document Reviewed: 11/06/2020 Elsevier Patient Education  2024 Elsevier Inc.    Maryagnes Small, MD [facility]

## 2023-11-25 NOTE — Assessment & Plan Note (Signed)
 BP Readings from Last 3 Encounters:  11/25/23 118/80  10/02/23 (!) 144/78  09/03/23 (!) 154/72  Well-controlled hypertension Continue amlodipine  5 mg, lisinopril  40 mg, and carvedilol  25 mg twice a day  Hemoglobin A1c higher than before 7.6, not at goal. Has been off Farxiga .  Recommend to start again Continue glipizide  5 mg twice a day and metformin  500 mg twice a day Cardiovascular risks associated with uncontrolled diabetes discussed Diet and nutrition discussed Follow-up in 6 months Blood work done today

## 2023-11-25 NOTE — Patient Instructions (Signed)
 Health Maintenance After Age 71 After age 4, you are at a higher risk for certain long-term diseases and infections as well as injuries from falls. Falls are a major cause of broken bones and head injuries in people who are older than age 47. Getting regular preventive care can help to keep you healthy and well. Preventive care includes getting regular testing and making lifestyle changes as recommended by your health care provider. Talk with your health care provider about: Which screenings and tests you should have. A screening is a test that checks for a disease when you have no symptoms. A diet and exercise plan that is right for you. What should I know about screenings and tests to prevent falls? Screening and testing are the best ways to find a health problem early. Early diagnosis and treatment give you the best chance of managing medical conditions that are common after age 37. Certain conditions and lifestyle choices may make you more likely to have a fall. Your health care provider may recommend: Regular vision checks. Poor vision and conditions such as cataracts can make you more likely to have a fall. If you wear glasses, make sure to get your prescription updated if your vision changes. Medicine review. Work with your health care provider to regularly review all of the medicines you are taking, including over-the-counter medicines. Ask your health care provider about any side effects that may make you more likely to have a fall. Tell your health care provider if any medicines that you take make you feel dizzy or sleepy. Strength and balance checks. Your health care provider may recommend certain tests to check your strength and balance while standing, walking, or changing positions. Foot health exam. Foot pain and numbness, as well as not wearing proper footwear, can make you more likely to have a fall. Screenings, including: Osteoporosis screening. Osteoporosis is a condition that causes  the bones to get weaker and break more easily. Blood pressure screening. Blood pressure changes and medicines to control blood pressure can make you feel dizzy. Depression screening. You may be more likely to have a fall if you have a fear of falling, feel depressed, or feel unable to do activities that you used to do. Alcohol use screening. Using too much alcohol can affect your balance and may make you more likely to have a fall. Follow these instructions at home: Lifestyle Do not drink alcohol if: Your health care provider tells you not to drink. If you drink alcohol: Limit how much you have to: 0-1 drink a day for women. 0-2 drinks a day for men. Know how much alcohol is in your drink. In the [place] one drink equals one 12 oz bottle of beer (355 mL), one 5 oz glass of wine (148 mL), or one 1 oz glass of hard liquor (44 mL). Do not use any products that contain nicotine or tobacco. These products include cigarettes, chewing tobacco, and vaping devices, such as e-cigarettes. If you need help quitting, ask your health care provider. Activity  Follow a regular exercise program to stay fit. This will help you maintain your balance. Ask your health care provider what types of exercise are appropriate for you. If you need a cane or walker, use it as recommended by your health care provider. Wear supportive shoes that have nonskid soles. Safety  Remove any tripping hazards, such as rugs, cords, and clutter. Install safety equipment such as grab bars in bathrooms and safety rails on stairs. Keep rooms and walkways  well-lit. General instructions Talk with your health care provider about your risks for falling. Tell your health care provider if: You fall. Be sure to tell your health care provider about all falls, even ones that seem minor. You feel dizzy, tiredness (fatigue), or off-balance. Take over-the-counter and prescription medicines only as told by your health care provider. These include  supplements. Eat a healthy diet and maintain a healthy weight. A healthy diet includes low-fat dairy products, low-fat (lean) meats, and fiber from whole grains, beans, and lots of fruits and vegetables. Stay current with your vaccines. Schedule regular health, dental, and eye exams. Summary Having a healthy lifestyle and getting preventive care can help to protect your health and wellness after age 11. Screening and testing are the best way to find a health problem early and help you avoid having a fall. Early diagnosis and treatment give you the best chance for managing medical conditions that are more common for people who are older than age 28. Falls are a major cause of broken bones and head injuries in people who are older than age 48. Take precautions to prevent a fall at home. Work with your health care provider to learn what changes you can make to improve your health and wellness and to prevent falls. This information is not intended to replace advice given to you by your health care provider. Make sure you discuss any questions you have with your health care provider. Document Revised: 11/06/2020 Document Reviewed: 11/06/2020 Elsevier Patient Education  2024 [employer].

## 2023-11-25 NOTE — Assessment & Plan Note (Signed)
 Uncontrolled diabetes with hemoglobin A1c of 7.6 Restart Farxiga  and continue glipizide  and metformin  Continue rosuvastatin  20 mg daily Diet and nutrition discussed

## 2023-11-25 NOTE — Assessment & Plan Note (Signed)
 Was able to follow-up with ENT. Underwent surgery successfully

## 2023-11-25 NOTE — Assessment & Plan Note (Signed)
 Stable.  No recent anginal episodes. Continues daily baby aspirin along with carvedilol 25 mg twice a day

## 2023-11-25 NOTE — Assessment & Plan Note (Signed)
 Was able to undergo upper endoscopy on 08/30/2023 Normal esophagus and normal stomach No signs of malignancy

## 2023-12-31 ENCOUNTER — Ambulatory Visit: Payer: Medicare Other

## 2023-12-31 VITALS — Ht 69.0 in | Wt 198.0 lb

## 2023-12-31 DIAGNOSIS — E1169 Type 2 diabetes mellitus with other specified complication: Secondary | ICD-10-CM

## 2023-12-31 DIAGNOSIS — E119 Type 2 diabetes mellitus without complications: Secondary | ICD-10-CM

## 2023-12-31 DIAGNOSIS — E785 Hyperlipidemia, unspecified: Secondary | ICD-10-CM

## 2023-12-31 DIAGNOSIS — Z0000 Encounter for general adult medical examination without abnormal findings: Secondary | ICD-10-CM | POA: Diagnosis not present

## 2023-12-31 NOTE — Progress Notes (Signed)
 Subjective:   Scott Dodson is a 73 y.o. who presents for a Medicare Wellness preventive visit.  As a reminder, Annual Wellness Visits don't include a physical exam, and some assessments may be limited, especially if this visit is performed virtually. We may recommend an in-person follow-up visit with your provider if needed.  Visit Complete: Virtual I connected with  Scott Dodson on 12/31/23 by a audio enabled telemedicine application and verified that I am speaking with the correct person using two identifiers.  Patient Location: Home  Provider Location: Office/Clinic  I discussed the limitations of evaluation and management by telemedicine. The patient expressed understanding and agreed to proceed.  Vital Signs: Because this visit was a virtual/telehealth visit, some criteria may be missing or patient reported. Any vitals not documented were not able to be obtained and vitals that have been documented are patient reported.  VideoDeclined- This patient declined Librarian, academic. Therefore the visit was completed with audio only.  Persons Participating in Visit: Patient.  AWV Questionnaire: No: Patient Medicare AWV questionnaire was not completed prior to this visit.  Cardiac Risk Factors include: advanced age (>57men, >47 women);diabetes mellitus;dyslipidemia;hypertension;male gender     Objective:    Today's Vitals   12/31/23 0850  Weight: 198 lb (89.8 kg)  Height: 5' 9 (1.753 m)   Body mass index is 29.24 kg/m.     12/31/2023    8:50 AM 08/20/2023    7:41 AM 12/25/2022    3:50 PM 06/28/2022    5:25 PM 06/28/2022   12:57 PM 06/07/2022    8:54 AM 08/11/2019    8:06 AM  Advanced Directives  Does Patient Have a Medical Advance Directive? Yes Yes Yes Yes Yes Yes Yes  Type of Estate agent of Protection;Living will Healthcare Power of [place];Living will Healthcare Power of [place];Living will Healthcare Power of [employer] Power of [employer] Power of Attorney  Does patient want to make changes to medical advance directive?    No - Patient declined No - Patient declined No - Patient declined No - Patient declined  Copy of Healthcare Power of Attorney in Chart? No - copy requested No - copy requested No - copy requested No - copy requested  No - copy requested No - copy requested  Would patient like information on creating a medical advance directive?    No - Patient declined  No - Patient declined No - Patient declined    Current Medications (verified) Outpatient Encounter Medications as of 12/31/2023  Medication Sig   acetaminophen  (TYLENOL ) 500 MG tablet Take 1 tablet (500 mg total) by mouth every 6 (six) hours. Take every 6 hrs x 2-3 days then take every 6 hrs as needed   albuterol  (VENTOLIN  HFA) 108 (90 Base) MCG/ACT inhaler Inhale 2 puffs into the lungs every 6 (six) hours as needed for wheezing or shortness of breath.   amLODipine  (NORVASC ) 5 MG tablet TAKE 1 TABLET BY MOUTH DAILY   Ascorbic Acid (VITAMIN C) 1000 MG tablet Take 2,000 mg by mouth daily.   aspirin  81 MG tablet Take 81 mg by mouth daily.   budesonide  (PULMICORT ) 0.5 MG/2ML nebulizer solution Take 0.5 mg by nebulization daily as needed (Asthma).   carvedilol  (COREG ) 25 MG tablet TAKE 1 TABLET BY MOUTH TWICE  DAILY WITH MEALS   cetirizine  (ZYRTEC ) 10 MG tablet Take 10 mg by mouth daily as needed for allergies.   Coenzyme Q10 (CO Q 10 PO) Take 1  tablet by mouth daily.   dapagliflozin  propanediol (FARXIGA ) 10 MG TABS tablet Take 1 tablet (10 mg total) by mouth daily before breakfast.   fluticasone  (FLONASE ) 50 MCG/ACT nasal spray Place 2 sprays into both nostrils daily.   glipiZIDE  (GLUCOTROL ) 5 MG tablet TAKE 1 TABLET BY MOUTH DAILY  WITH BREAKFAST   lisinopril  (ZESTRIL ) 40 MG tablet TAKE 1 TABLET BY MOUTH DAILY   metFORMIN  (GLUCOPHAGE ) 500 MG tablet TAKE 1 TABLET BY MOUTH TWICE  DAILY WITH MEALS   oxyCODONE  (ROXICODONE ) 5 MG  immediate release tablet Take 1 tablet (5 mg total) by mouth every 6 (six) hours as needed for severe pain (pain score 7-10).   oxymetazoline  (AFRIN NASAL SPRAY) 0.05 % nasal spray Place 2 sprays into both nostrils 2 (two) times daily as needed for congestion. Use for epistaxis as needed avoid using for more than 48 hrs   rosuvastatin  (CRESTOR ) 20 MG tablet TAKE 1 TABLET BY MOUTH DAILY   cetirizine  (ZYRTEC ) 10 MG tablet Take 1 tablet (10 mg total) by mouth daily.   famotidine  (PEPCID ) 20 MG tablet TAKE 1 TABLET BY MOUTH TWICE A DAY (Patient not taking: Reported on 12/31/2023)   famotidine  (PEPCID ) 20 MG tablet Take 1 tablet (20 mg total) by mouth 2 (two) times daily.   fluticasone  furoate-vilanterol (BREO ELLIPTA ) 100-25 MCG/ACT AEPB INHALE 1 PUFF INTO THE LUNGS DAILY. RINSE MOUTH AFTER USE (Patient not taking: Reported on 12/31/2023)   methylPREDNISolone  (MEDROL  DOSEPAK) 4 MG TBPK tablet Take with signs of chronic sinusitis and take as directed (Patient not taking: Reported on 11/25/2023)   No facility-administered encounter medications on file as of 12/31/2023.    Allergies (verified) Patient has no known allergies.   History: Past Medical History:  Diagnosis Date   Allergy    Asthma    Coronary artery disease    s/p lateral MI 4 /11, s/p Promus DES OM2   Diabetes mellitus without complication (HCC)    Heart murmur    History of pneumonia    Hyperlipidemia    Hypertension    Myocardial infarct [facility]) 2011   Past Surgical History:  Procedure Laterality Date   ADENOIDECTOMY     COLONOSCOPY     CORONARY STENT PLACEMENT  2011   MAXILLARY ANTROSTOMY Bilateral 08/20/2023   Procedure: MAXILLARY ANTROSTOMY;  Surgeon: Okey Burns, MD;  Location: [facility] OR;  Service: ENT;  Laterality: Bilateral;   NASAL ENDOSCOPY Bilateral 08/20/2023   Procedure: NASAL ENDOSCOPY;  Surgeon: Okey Burns, MD;  Location: [facility] OR;  Service: ENT;  Laterality: Bilateral;   POLYPECTOMY Bilateral 08/20/2023    Procedure: POLYPECTOMY NASAL;  Surgeon: Okey Burns, MD;  Location: [facility] OR;  Service: ENT;  Laterality: Bilateral;   SEPTOPLASTY N/A 08/20/2023   Procedure: SEPTOPLASTY;  Surgeon: Okey Burns, MD;  Location: [facility] OR;  Service: ENT;  Laterality: N/A;   SINUS ENDO WITH FUSION N/A 08/20/2023   Procedure: SINUS ENDO WITH FUSION;  Surgeon: Okey Burns, MD;  Location: [facility] OR;  Service: ENT;  Laterality: N/A;   TONSILLECTOMY     TURBINATE REDUCTION Bilateral 08/20/2023   Procedure: TURBINATE REDUCTION;  Surgeon: Okey Burns, MD;  Location: [facility] OR;  Service: ENT;  Laterality: Bilateral;   TYMPANIC MEMBRANE REPAIR Right 2004   UPPER GASTROINTESTINAL ENDOSCOPY     VASECTOMY     Family History  Problem Relation Age of Onset   Heart disease Mother    Hypertension Mother    Heart disease Father    Hypertension Father  Heart disease Brother    Heart disease Sister    Colon cancer Maternal Grandfather 24   Colon polyps Neg Hx    Esophageal cancer Neg Hx    Stomach cancer Neg Hx    Rectal cancer Neg Hx    Social History   Socioeconomic History   Marital status: Married    Spouse name: Not on file   Number of children: 3   Years of education: Not on file   Highest education level: Not on file  Occupational History   Occupation: Company secretary   Occupation: retired  Tobacco Use   Smoking status: Former    Current packs/day: 0.00    Average packs/day: 0.5 packs/day for 5.0 years (2.5 ttl pk-yrs)    Types: Cigarettes    Start date: 10/13/1969    Quit date: 10/14/1974    Years since quitting: 49.2   Smokeless tobacco: Never  Vaping Use   Vaping status: Never Used  Substance and Sexual Activity   Alcohol use: Yes    Alcohol/week: 5.0 standard drinks of alcohol    Types: 5 Standard drinks or equivalent per week    Comment: 1-2 drink daily   Drug use: No   Sexual activity: Yes  Other Topics Concern   Not on file  Social History Narrative   Occupation-  Writer    Former -tobacco -stopped 1979   Married ,3 children     Alcohol use - yes , social    Drug use - no   Regular exercise- yes    Social Drivers of Corporate investment banker Strain: Low Risk  (12/31/2023)   Overall Financial Resource Strain (CARDIA)    Difficulty of Paying Living Expenses: Not hard at all  Food Insecurity: No Food Insecurity (12/31/2023)   Hunger Vital Sign    Worried About Running Out of Food in the Last Year: Never true    Ran Out of Food in the Last Year: Never true  Transportation Needs: No Transportation Needs (12/31/2023)   PRAPARE - Administrator, Civil Service (Medical): No    Lack of Transportation (Non-Medical): No  Physical Activity: Sufficiently Active (12/31/2023)   Exercise Vital Sign    Days of Exercise per Week: 3 days    Minutes of Exercise per Session: 120 min  Stress: No Stress Concern Present (12/31/2023)   [employer] of Occupational Health - Occupational Stress Questionnaire    Feeling of Stress: Not at all  Social Connections: Moderately Integrated (12/31/2023)   Social Connection and Isolation Panel    Frequency of Communication with Friends and Family: Once a week    Frequency of Social Gatherings with Friends and Family: Once a week    Attends Religious Services: More than 4 times per year    Active Member of [employer] or Organizations: Yes    Attends Banker Meetings: 1 to 4 times per year    Marital Status: Married    Tobacco Counseling Counseling given: No    Clinical Intake:  Pre-visit preparation completed: Yes  Pain : No/denies pain     BMI - recorded: 29.24 Nutritional Status: BMI 25 -29 Overweight Nutritional Risks: None Diabetes: Yes CBG done?: No Did pt. bring in CBG monitor from home?: No  Lab Results  Component Value Date   HGBA1C 7.6 (A) 11/25/2023   HGBA1C 6.6 (H) 05/26/2023   HGBA1C 6.3 (A) 11/18/2022     How often do you need to  have someone help you  when you read instructions, pamphlets, or other written materials from your doctor or pharmacy?: 1 - Never  Interpreter Needed?: No  Information entered by :: Verdie Saba, CMA   Activities of Daily Living     12/31/2023    9:10 AM 08/20/2023    7:38 AM  In your present state of health, do you have any difficulty performing the following activities:  Hearing? 0   Vision? 0   Difficulty concentrating or making decisions? 0   Walking or climbing stairs? 0   Dressing or bathing? 0   Doing errands, shopping? 0 0  Preparing Food and eating ? N   Using the Toilet? N   In the past six months, have you accidently leaked urine? N   Do you have problems with loss of bowel control? N   Managing your Medications? N   Managing your Finances? N   Housekeeping or managing your Housekeeping? N     Patient Care Team: Purcell Emil Schanz, MD as PCP - General (Internal Medicine) Octavia Bruckner, MD as Consulting Physician (Ophthalmology) Magdalen Pasco RAMAN, DPM as Consulting Physician (Podiatry)  I have updated your Care Teams any recent Medical Services you may have received from other providers in the past year.     Assessment:   This is a routine wellness examination for Phares.  Hearing/Vision screen Hearing Screening - Comments:: Denies hearing difficulties   Vision Screening - Comments:: Wears rx glasses - up to date with routine eye exams with Dr Octavia   Goals Addressed               This Visit's Progress     Patient Stated (pt-stated)        Patient stated he wants to continue waking up and seeing another day - keep working        Depression Screen     12/31/2023    8:58 AM 11/25/2023   12:15 PM 05/26/2023    8:07 AM 12/25/2022    3:59 PM 11/18/2022    8:10 AM 07/10/2022    9:41 AM 08/17/2020    8:06 AM  PHQ 2/9 Scores  PHQ - 2 Score 0 0 0 0 0 0 0  PHQ- 9 Score 0   0       Fall Risk     12/31/2023    8:57 AM 11/25/2023   12:16 PM 05/26/2023    8:07 AM 12/25/2022     3:51 PM 11/18/2022    8:10 AM  Fall Risk   Falls in the past year? 1 0 0 0 0  Number falls in past yr: 0 0 0 0 0  Comment 1      Injury with Fall? 0 0 0 0 0  Risk for fall due to :   No Fall Risks No Fall Risks No Fall Risks  Follow up Falls evaluation completed;Falls prevention discussed  Falls evaluation completed Falls prevention discussed Falls evaluation completed    MEDICARE RISK AT HOME:  Medicare Risk at Home Any stairs in or around the home?:  (outside) If so, are there any without handrails?: No Home free of loose throw rugs in walkways, pet beds, electrical cords, etc?: Yes Adequate lighting in your home to reduce risk of falls?: Yes Life alert?: No Use of a cane, walker or w/c?: No Grab bars in the bathroom?: Yes Shower chair or bench in shower?: Yes Elevated toilet seat or a handicapped toilet?: Yes  TIMED  UP AND GO:  Was the test performed?  No  Cognitive Function: 6CIT completed        12/31/2023    9:00 AM 12/25/2022    3:53 PM 08/11/2019    8:04 AM 10/14/2018   10:31 AM  6CIT Screen  What Year? 0 points 0 points 0 points 0 points  What month? 0 points 0 points 0 points 0 points  What time? 0 points 0 points 0 points 0 points  Count back from 20 0 points 0 points 0 points 0 points  Months in reverse 0 points 0 points 0 points 0 points  Repeat phrase 2 points 0 points 0 points 0 points  Total Score 2 points 0 points 0 points 0 points    Immunizations Immunization History  Administered Date(s) Administered   PFIZER(Purple Top)SARS-COV-2 Vaccination 02/15/2020, 03/07/2020   Pneumococcal Conjugate-13 08/12/2018   Pneumococcal Polysaccharide-23 02/15/2020   Td 01/17/2010   Tdap 08/10/2012   Zoster Recombinant(Shingrix) 08/13/2018, 03/05/2019    Screening Tests Health Maintenance  Topic Date Due   COVID-19 Vaccine (3 - Pfizer risk series) 04/04/2020   FOOT EXAM  08/16/2020   INFLUENZA VACCINE  01/30/2024   HEMOGLOBIN A1C  05/27/2024    OPHTHALMOLOGY EXAM  09/09/2024   Diabetic kidney evaluation - eGFR measurement  11/24/2024   Diabetic kidney evaluation - Urine ACR  11/24/2024   Medicare Annual Wellness (AWV)  12/30/2024   Colonoscopy  11/15/2025   Pneumococcal Vaccine: 50+ Years  Completed   Hepatitis C Screening  Completed   Zoster Vaccines- Shingrix  Completed   Hepatitis B Vaccines  Aged Out   HPV VACCINES  Aged Out   Meningococcal B Vaccine  Aged Out   DTaP/Tdap/Td  Discontinued    Health Maintenance  Health Maintenance Due  Topic Date Due   COVID-19 Vaccine (3 - Pfizer risk series) 04/04/2020   FOOT EXAM  08/16/2020   Health Maintenance Items Addressed: 12/31/2023  Referral to Dr Pasco Ovens ([facility]) for a Diabetic Foot Exam   Additional Screening:  Vision Screening: Recommended annual ophthalmology exams for early detection of glaucoma and other disorders of the eye. Would you like a referral to an eye doctor? No  Patient stated he has seen Dr Octavia in 2025.  Dental Screening: Recommended annual dental exams for proper oral hygiene  Community Resource Referral / Chronic Care Management: CRR required this visit?  No   CCM required this visit?  No   Plan:    I have personally reviewed and noted the following in the patient's chart:   Medical and social history Use of alcohol, tobacco or illicit drugs  Current medications and supplements including opioid prescriptions. Patient is not currently taking opioid prescriptions. Functional ability and status Nutritional status Physical activity Advanced directives List of other physicians Hospitalizations, surgeries, and ER visits in previous 12 months Vitals Screenings to include cognitive, depression, and falls Referrals and appointments  In addition, I have reviewed and discussed with patient certain preventive protocols, quality metrics, and best practice recommendations. A written personalized care plan for preventive services as  well as general preventive health recommendations were provided to patient.   Verdie CHRISTELLA Saba, CMA   12/31/2023   After Visit Summary: (MyChart) Due to this being a telephonic visit, the after visit summary with patients personalized plan was offered to patient via MyChart   Notes: Pt has Oxygen  on hand from being discharged but has not used it and awaiting an order by PCP  to dc for [employer].  Referral to Dr Magdalen of [facility] for a Diabetic Foot Exam.

## 2023-12-31 NOTE — Patient Instructions (Addendum)
 Scott Dodson , Thank you for taking time out of your busy schedule to complete your Annual Wellness Visit with me. I enjoyed our conversation and look forward to speaking with you again next year. I, as well as your care team,  appreciate your ongoing commitment to your health goals. Please review the following plan we discussed and let me know if I can assist you in the future. Your Game plan/ To Do List    Referral to Dr Pasco Ovens of [facility] for a Diabetic Foot Exam. Follow up Visits: Next Medicare AWV with our clinical staff: 01/03/2025   Have you seen your provider in the last 6 months (3 months if uncontrolled diabetes)? Yes Next Office Visit with your provider: 122/2025  Clinician Recommendations:  Aim for 30 minutes of exercise or brisk walking, 6-8 glasses of water, and 5 servings of fruits and vegetables each day.       This is a list of the screening recommended for you and due dates:  Health Maintenance  Topic Date Due   COVID-19 Vaccine (3 - Pfizer risk series) 04/04/2020   Complete foot exam   08/16/2020   Flu Shot  01/30/2024   Hemoglobin A1C  05/27/2024   Eye exam for diabetics  09/09/2024   Yearly kidney function blood test for diabetes  11/24/2024   Yearly kidney health urinalysis for diabetes  11/24/2024   Medicare Annual Wellness Visit  12/30/2024   Colon Cancer Screening  11/15/2025   Pneumococcal Vaccine for age over 52  Completed   Hepatitis C Screening  Completed   Zoster (Shingles) Vaccine  Completed   Hepatitis B Vaccine  Aged Out   HPV Vaccine  Aged Out   Meningitis B Vaccine  Aged Out   DTaP/Tdap/Td vaccine  Discontinued    Advanced directives: (Copy Requested) Please bring a copy of your health care power of attorney and living will to the office to be added to your chart at your convenience. You can mail to [facility] [address] [place], [place] [address] or email to [email or web address] Advance Care Planning is  important because it:  [x]  Makes sure you receive the medical care that is consistent with your values, goals, and preferences  [x]  It provides guidance to your family and loved ones and reduces their decisional burden about whether or not they are making the right decisions based on your wishes.  Follow the link provided in your after visit summary or read over the paperwork we have mailed to you to help you started getting your Advance Directives in place. If you need assistance in completing these, please reach out to us  so that we can help you!   Managing Pain Without Opioids Opioids are strong medicines used to treat moderate to severe pain. For some people, especially those who have long-term (chronic) pain, opioids may not be the best choice for pain management due to: Side effects like nausea, constipation, and sleepiness. The risk of addiction (opioid use disorder). The longer you take opioids, the greater your risk of addiction. Pain that lasts for more than 3 months is called chronic pain. Managing chronic pain usually requires more than one approach and is often provided by a team of health care providers working together (multidisciplinary approach). Pain management may be done at a pain management center or pain clinic. How to manage pain without the use of opioids Use non-opioid medicines Non-opioid medicines for pain may include: Over-the-counter or prescription non-steroidal  anti-inflammatory drugs (NSAIDs). These may be the first medicines used for pain. They work well for muscle and bone pain, and they reduce swelling. Acetaminophen . This over-the-counter medicine may work well for milder pain but not swelling. Antidepressants. These may be used to treat chronic pain. A certain type of antidepressant (tricyclics) is often used. These medicines are given in lower doses for pain than when used for depression. Anticonvulsants. These are usually used to treat seizures but may also  reduce nerve (neuropathic) pain. Muscle relaxants. These relieve pain caused by sudden muscle tightening (spasms). You may also use a pain medicine that is applied to the skin as a patch, cream, or gel (topical analgesic), such as a numbing medicine. These may cause fewer side effects than medicines taken by mouth. Do certain therapies as directed Some therapies can help with pain management. They include: Physical therapy. You will do exercises to gain strength and flexibility. A physical therapist may teach you exercises to move and stretch parts of your body that are weak, stiff, or painful. You can learn these exercises at physical therapy visits and practice them at home. Physical therapy may also involve: Massage. Heat wraps or applying heat or cold to affected areas. Electrical signals that interrupt pain signals (transcutaneous electrical nerve stimulation, TENS). Weak lasers that reduce pain and swelling (low-level laser therapy). Signals from your body that help you learn to regulate pain (biofeedback). Occupational therapy. This helps you to learn ways to function at home and work with less pain. Recreational therapy. This involves trying new activities or hobbies, such as a physical activity or drawing. Mental health therapy, including: Cognitive behavioral therapy (CBT). This helps you learn coping skills for dealing with pain. Acceptance and commitment therapy (ACT) to change the way you think and react to pain. Relaxation therapies, including muscle relaxation exercises and mindfulness-based stress reduction. Pain management counseling. This may be individual, family, or group counseling.  Receive medical treatments Medical treatments for pain management include: Nerve block injections. These may include a pain blocker and anti-inflammatory medicines. You may have injections: Near the spine to relieve chronic back or neck pain. Into joints to relieve back or joint pain. Into  nerve areas that supply a painful area to relieve body pain. Into muscles (trigger point injections) to relieve some painful muscle conditions. A medical device placed near your spine to help block pain signals and relieve nerve pain or chronic back pain (spinal cord stimulation device). Acupuncture. Follow these instructions at home Medicines Take over-the-counter and prescription medicines only as told by your health care provider. If you are taking pain medicine, ask your health care providers about possible side effects to watch out for. Do not drive or use heavy machinery while taking prescription opioid pain medicine. Lifestyle  Do not use drugs or alcohol to reduce pain. If you drink alcohol, limit how much you have to: 0-1 drink a day for women who are not pregnant. 0-2 drinks a day for men. Know how much alcohol is in a drink. In the [place] one drink equals one 12 oz bottle of beer (355 mL), one 5 oz glass of wine (148 mL), or one 1 oz glass of hard liquor (44 mL). Do not use any products that contain nicotine or tobacco. These products include cigarettes, chewing tobacco, and vaping devices, such as e-cigarettes. If you need help quitting, ask your health care provider. Eat a healthy diet and maintain a healthy weight. Poor diet and excess weight may make pain worse.  Eat foods that are high in fiber. These include fresh fruits and vegetables, whole grains, and beans. Limit foods that are high in fat and processed sugars, such as fried and sweet foods. Exercise regularly. Exercise lowers stress and may help relieve pain. Ask your health care provider what activities and exercises are safe for you. If your health care provider approves, join an exercise class that combines movement and stress reduction. Examples include yoga and tai chi. Get enough sleep. Lack of sleep may make pain worse. Lower stress as much as possible. Practice stress reduction techniques as told by your  therapist. General instructions Work with all your pain management providers to find the treatments that work best for you. You are an important member of your pain management team. There are many things you can do to reduce pain on your own. Consider joining an online or in-person support group for people who have chronic pain. Keep all follow-up visits. This is important. Where to find more information You can find more information about managing pain without opioids from: American Academy of Pain Medicine: painmed.org Institute for Chronic Pain: instituteforchronicpain.org American Chronic Pain Association: theacpa.org Contact a health care provider if: You have side effects from pain medicine. Your pain gets worse or does not get better with treatments or home therapy. You are struggling with anxiety or depression. Summary Many types of pain can be managed without opioids. Chronic pain may respond better to pain management without opioids. Pain is best managed when you and a team of health care providers work together. Pain management without opioids may include non-opioid medicines, medical treatments, physical therapy, mental health therapy, and lifestyle changes. Tell your health care providers if your pain gets worse or is not being managed well enough. This information is not intended to replace advice given to you by your health care provider. Make sure you discuss any questions you have with your health care provider. Document Revised: 09/27/2020 Document Reviewed: 09/27/2020 Elsevier Patient Education  2024 [employer].

## 2024-01-08 ENCOUNTER — Other Ambulatory Visit: Payer: Self-pay | Admitting: Emergency Medicine

## 2024-01-08 DIAGNOSIS — E1159 Type 2 diabetes mellitus with other circulatory complications: Secondary | ICD-10-CM

## 2024-01-08 DIAGNOSIS — I10 Essential (primary) hypertension: Secondary | ICD-10-CM

## 2024-01-12 ENCOUNTER — Encounter: Payer: Self-pay | Admitting: Podiatry

## 2024-01-12 ENCOUNTER — Ambulatory Visit (INDEPENDENT_AMBULATORY_CARE_PROVIDER_SITE_OTHER)

## 2024-01-12 ENCOUNTER — Ambulatory Visit: Admitting: Podiatry

## 2024-01-12 DIAGNOSIS — M722 Plantar fascial fibromatosis: Secondary | ICD-10-CM

## 2024-01-12 MED ORDER — TRIAMCINOLONE ACETONIDE 10 MG/ML IJ SUSP
10.0000 mg | Freq: Once | INTRAMUSCULAR | Status: AC
  Administered 2024-01-12: 10 mg via INTRA_ARTICULAR

## 2024-01-12 NOTE — Patient Instructions (Signed)

## 2024-01-13 NOTE — Progress Notes (Signed)
 Subjective:   Patient ID: Scott Dodson, male   DOB: 73 y.o.   MRN: [id number]   HPI Patient presents stating that he has a lot of pain in the heel region bilateral right over left and that he does stand on concrete 5-hour periods with his job.  States its gradually getting worse and making walking difficult and patient does not smoke likes to be active   Review of Systems  All other systems reviewed and are negative.       Objective:  Physical Exam Vitals and nursing note reviewed.  Constitutional:      Appearance: He is well-developed.  Pulmonary:     Effort: Pulmonary effort is normal.  Musculoskeletal:        General: Normal range of motion.  Skin:    General: Skin is warm.  Neurological:     Mental Status: He is alert.     Neurovascular status intact muscle strength found to be adequate range of motion adequate exquisite discomfort medial fascial band central band right over left with inflammation fluid buildup noted with patient found to have moderate equinus.  Patient has good digital perfusion well-oriented x 3     Assessment:  Inflammatory fasciitis of the heel right over left with fluid buildup moderate depression of the arch     Plan:  H&P x-rays reviewed sterile prep injected the plantar fascia bilateral 3 mg Kenalog  5 mg Xylocaine  applied sterile dressing applied fascial brace right that was properly fitted into the arch with all instructions given and reappoint to recheck  X-rays indicate small spurs no indication stress fracture arthritis associated with condition

## 2024-02-12 ENCOUNTER — Ambulatory Visit: Admitting: Podiatry

## 2024-02-12 DIAGNOSIS — M722 Plantar fascial fibromatosis: Secondary | ICD-10-CM

## 2024-02-12 MED ORDER — TRIAMCINOLONE ACETONIDE 10 MG/ML IJ SUSP
10.0000 mg | Freq: Once | INTRAMUSCULAR | Status: AC
  Administered 2024-02-12: 10 mg via INTRA_ARTICULAR

## 2024-02-12 NOTE — Progress Notes (Signed)
 Subjective:   Patient ID: Scott Dodson, male   DOB: 73 y.o.   MRN: [id number]   HPI Patient states the heels are doing well but the arch has been sore on both feet   ROS      Objective:  Physical Exam  Neurovascular status intact significant improvement in the plantar fascia at insertion bilateral with inflammation into the mid arch area bilateral     Assessment:  Acute plantar fasciitis bilateral heels improved with quite a bit of pain in the arch bilateral     Plan:  H&P reviewed sterile prep injected the mid arch area bilateral 3 mg dexamethasone  Kenalog  5 mg Xylocaine  advised on stretch reappoint as symptoms indicate

## 2024-02-13 ENCOUNTER — Other Ambulatory Visit (HOSPITAL_COMMUNITY): Payer: Self-pay

## 2024-04-02 ENCOUNTER — Ambulatory Visit (INDEPENDENT_AMBULATORY_CARE_PROVIDER_SITE_OTHER): Admitting: Otolaryngology

## 2024-04-05 ENCOUNTER — Encounter (INDEPENDENT_AMBULATORY_CARE_PROVIDER_SITE_OTHER): Payer: Self-pay | Admitting: Otolaryngology

## 2024-04-05 ENCOUNTER — Ambulatory Visit (INDEPENDENT_AMBULATORY_CARE_PROVIDER_SITE_OTHER): Admitting: Otolaryngology

## 2024-04-05 VITALS — BP 121/67 | HR 54 | Temp 98.1°F

## 2024-04-05 DIAGNOSIS — Z87891 Personal history of nicotine dependence: Secondary | ICD-10-CM | POA: Diagnosis not present

## 2024-04-05 DIAGNOSIS — J339 Nasal polyp, unspecified: Secondary | ICD-10-CM | POA: Diagnosis not present

## 2024-04-05 DIAGNOSIS — J3089 Other allergic rhinitis: Secondary | ICD-10-CM

## 2024-04-05 DIAGNOSIS — R0982 Postnasal drip: Secondary | ICD-10-CM

## 2024-04-05 DIAGNOSIS — R0981 Nasal congestion: Secondary | ICD-10-CM | POA: Diagnosis not present

## 2024-04-05 DIAGNOSIS — J4520 Mild intermittent asthma, uncomplicated: Secondary | ICD-10-CM

## 2024-04-05 DIAGNOSIS — J3489 Other specified disorders of nose and nasal sinuses: Secondary | ICD-10-CM | POA: Diagnosis not present

## 2024-04-05 MED ORDER — LEVOCETIRIZINE DIHYDROCHLORIDE 5 MG PO TABS: 5.0000 mg | ORAL_TABLET | Freq: Every evening | ORAL | 3 refills | Status: AC

## 2024-04-05 MED ORDER — BUDESONIDE 0.5 MG/2ML IN SUSP: 0.5000 mg | Freq: Two times a day (BID) | RESPIRATORY_TRACT | 12 refills | Status: AC

## 2024-04-05 NOTE — Progress Notes (Signed)
 ENT Progress Note:   Update 04/05/2024  Discussed the use of AI scribe software for clinical note transcription with the patient, who gave verbal consent to proceed.  History of Present Illness   Discussed the use of AI scribe software for clinical note transcription with the patient, who gave verbal consent to proceed.  History of Present Illness Scott Dodson is a 73 year old male who presents for a follow-up visit after nasal surgery (septo/ITR and nasal polypectomy)  He has been using Zyrtec  as part of his nasal routine. Dupixent has not been started due to lack of follow-up. He continues to use a steroid in his nasal rinse and may need a refill.  No nasal bleeding or crusting.   Records Reviewed:  Initial Evaluation  Update 08/04/23 Discussed the use of AI scribe software for clinical note transcription with the patient, who gave verbal consent to proceed.  History of Present Illness   The patient is a 73 year old male with hx of chronic nasal congestion and bilateral nasal polyps who presents for f/u.  He has been doing steroid nasal rinses, including a device that allows rinsing in both directions similar to Navage, which improved their nasal congestion, sense of smell, and ease of breathing through the nose.  They have arranged a payment plan with their insurance company to be able to start Dupixent. They have a history of a heart attack in 2011 and have undergone heart checks, including echocardiograms, with no recent issues. They have a cardiologist, Dr. Verlin, and had seen their team for preop clearance already and had Echo done.    Update 04/28/23: he returns for follow-up after neck U/S and MBS//esophagram CT sinuses. Reports no changes in the size of neck lump, denies pain or skin changes. Swallowing function is stable, on regular diet. Continues to have nasal congestion, and started nasal saline rinses. On antihistamine. On Famotidine .   Initial consult 03/13/23:    Reason for Consult: neck mass and dysphagia  nasal congestion phlegm   HPI: Scott Dodson is an 73 y.o. male with hx of MI in 2011, history of diabetes, former smoking, quit in 70s, right TM reconstruction due to history of chronic ear infections performed, who is here for evaluation of longstanding chronic dysphagia symptoms and left anterior neck mass.   He is here for chronic dysphagia symptoms primarily with solids. He reports no issues with liquids. When he is eating solid foods it would get stuck in his throat. Eventually it all goes down. He has food regurgitation (undigested). Usually bread is hardest for him. Foods with particles and dry foods cause him to cough and choke.  This has been a problem for over 4-5 yrs.  He reports persistent phlegm in the back of his throat.  He also reports chronic nasal congestion and symptoms of difficulties with breathing through his nose.  He was evaluated by Dr. Luciano, and had MBS and esophagram.  He also had CT neck.  I reviewed all imaging and summarized findings in assessment and plan.   She reports longstanding history of a lump along the left anterior neck, that did not bother him, but was noted by one of the physicians who saw him in the past, denies pain, denies significant changes in size of the lump. Of note CT soft tissue neck did not mention any lymphadenopathy or any evidence of anterior left neck mass that was performed on September 25, 2022.   Records Reviewed:  Dr Vella note from  11/04/22 Scott Dodson is a 73 y.o. year old male who presents for evaluation of a left neck nodule.  The patient reports a non-painful lump on the left side of his neck, which he first noticed approximately a year ago. The nodule fluctuates in size, occasionally decreasing in size. He denies experiencing fevers, chills, night sweats, or weight loss.  The patient experiences dysphagia, particularly when consuming tablets and solids. He describes a sensation of food  becoming lodged in his throat. He has not yet scheduled his swallow study ordered by Gastroenterology. He denies experiencing reflux. Depending on the type of food consumed, he experiences choking and coughing.  He has no history of sinus surgery. He denies significant nasal congestion and generally breathes through his nose without difficulty. He experiences facial pain and pressure during colds. His olfactory function is normal. He experiences persistent throat drainage. He has Flonase  available for use. He suffers from numerous allergies.  He smoked a long time ago. He drinks 2 alcoholic beverages per night. He is retired from his main job and now works part-time. He works for [employer].   GI records Dr. Victory Brand 08/30/22 Clinical details of this patient's symptoms are in my July 03, 2022 office consult note. About 2 years of chronic solid food dysphagia. Scott Dodson reports having noticed a lump in the left side of his neck for the last few weeks.    EGD   The examined esophagus was normal. The scope was                            withdrawn. Dilation was performed with a Maloney                            dilator with no resistance at 52 Fr.                           The stomach was normal.                           The cardia and gastric fundus were normal on                            retroflexion. (Hill grade 1)                           The examined duodenum was normal.                           The larynx could not be well-visualized. Complications:            No immediate complications.   Past Medical History:  Diagnosis Date   Allergy    Asthma    Coronary artery disease    s/p lateral MI 4 /11, s/p Promus DES OM2   Diabetes mellitus without complication (HCC)    Heart murmur    History of pneumonia    Hyperlipidemia    Hypertension    Myocardial infarct [facility]) 2011    Past Surgical History:  Procedure Laterality Date   ADENOIDECTOMY     COLONOSCOPY      CORONARY STENT PLACEMENT  2011   MAXILLARY ANTROSTOMY Bilateral 08/20/2023   Procedure: MAXILLARY ANTROSTOMY;  Surgeon: Okey Burns, MD;  Location: [facility] OR;  Service: ENT;  Laterality: Bilateral;   NASAL ENDOSCOPY Bilateral 08/20/2023   Procedure: NASAL ENDOSCOPY;  Surgeon: Okey Burns, MD;  Location: [facility] OR;  Service: ENT;  Laterality: Bilateral;   POLYPECTOMY Bilateral 08/20/2023   Procedure: POLYPECTOMY NASAL;  Surgeon: Okey Burns, MD;  Location: [facility] OR;  Service: ENT;  Laterality: Bilateral;   SEPTOPLASTY N/A 08/20/2023   Procedure: SEPTOPLASTY;  Surgeon: Okey Burns, MD;  Location: [facility] OR;  Service: ENT;  Laterality: N/A;   SINUS ENDO WITH FUSION N/A 08/20/2023   Procedure: SINUS ENDO WITH FUSION;  Surgeon: Okey Burns, MD;  Location: [facility] OR;  Service: ENT;  Laterality: N/A;   TONSILLECTOMY     TURBINATE REDUCTION Bilateral 08/20/2023   Procedure: TURBINATE REDUCTION;  Surgeon: Okey Burns, MD;  Location: [facility] OR;  Service: ENT;  Laterality: Bilateral;   TYMPANIC MEMBRANE REPAIR Right 2004   UPPER GASTROINTESTINAL ENDOSCOPY     VASECTOMY      Family History  Problem Relation Age of Onset   Heart disease Mother    Hypertension Mother    Heart disease Father    Hypertension Father    Heart disease Brother    Heart disease Sister    Colon cancer Maternal Grandfather 2   Colon polyps Neg Hx    Esophageal cancer Neg Hx    Stomach cancer Neg Hx    Rectal cancer Neg Hx     Social History:  reports that he quit smoking about 49 years ago. His smoking use included cigarettes. He started smoking about 54 years ago. He has a 2.5 pack-year smoking history. He has never used smokeless tobacco. He reports current alcohol use of about 5.0 standard drinks of alcohol per week. He reports that he does not use drugs.  Allergies: No Known Allergies  Medications: I have reviewed the patient's current medications.  The PMH, PSH, Medications, Allergies, and SH were reviewed  and updated.  ROS: Constitutional: Negative for fever, weight loss and weight gain. Cardiovascular: Negative for chest pain and dyspnea on exertion. Respiratory: Is not experiencing shortness of breath at rest. Gastrointestinal: Negative for nausea and vomiting. Neurological: Negative for headaches. Psychiatric: The patient is not nervous/anxious  Blood pressure 121/67, pulse (!) 54, temperature 98.1 F (36.7 C), SpO2 95%.  PHYSICAL EXAM:  Exam: General: Well-developed, well-nourished Respiratory Respiratory effort: Equal inspiration and expiration without stridor Cardiovascular Peripheral Vascular: Warm extremities with equal color/perfusion Eyes: No nystagmus with equal extraocular motion bilaterally Neuro/Psych/Balance: Patient oriented to person, place, and time; Appropriate mood and affect; Gait is intact with no imbalance; Cranial nerves I-XII are intact Head and Face Inspection: Normocephalic and atraumatic without mass or lesion Facial Strength: Facial motility symmetric and full bilaterally ENT Pinna: External ear intact and fully developed External canal: Canal is patent with intact skin Tympanic Membrane: Clear and mobile External Nose: No scar or anatomic deformity Internal Nose: Septal incision healed well, crusting bilaterally, removed using nasal endoscope, Blakesley and suction, bilateral middle turbinates appear medialized, no active epistaxis, no septal perforation   Lips, Teeth, and gums: Mucosa and teeth intact and viable, missing teeth on exam Oral cavity/oropharynx: No erythema or exudate, no lesions present No active bleeding along posterior pharynx Nasopharynx: No mass or lesion with intact mucosa  Neck Neck and Trachea: Midline trachea without mass or lesion.  Procedure: PROCEDURE NOTE: nasal endoscopy   Preoperative diagnosis: s/p septoplasty ITR and nasal polypectomy    Postoperative diagnosis: same + septal  perforation  Procedure: Diagnostic  nasal endoscopy (68768)   Findings: healed nearly completely, white thick secretions along left middle meatus, R maxillary antrostomy patent without purulence noted on the right side, mild mucosal edema, nasal passages patent no epistaxis  Surgeon: Elena Larry, M.D.  Anesthesia: Topical lidocaine  and Afrin  H&P REVIEW: The patient's history and physical were reviewed today prior to procedure. All medications were reviewed and updated as well. Complications: None Condition is stable throughout exam Indications and consent: The patient presents with symptoms of chronic sinusitis not responding to previous therapies. All the risks, benefits, and potential complications were reviewed with the patient preoperatively and informed consent was obtained. The time out was completed with confirmation of the correct procedure.   Procedure: The patient was seated upright in the clinic. Topical lidocaine  and Afrin were applied to the nasal cavity. After adequate anesthesia had occurred, the rigid nasal endoscope was passed into the nasal cavity. The nasal mucosa, turbinates, septum,  were visualized bilaterally. There was no purulence or polyps noted. The visualized mucosa of the septum was intact. The scope was then slowly withdrawn and the patient tolerated the procedure well. There were no complications or blood loss.  Studies Reviewed: CT neck w/con   Assessment/Plan: Encounter Diagnoses  Name Primary?   Nasal polyps Yes   Post-nasal drainage    Chronic nasal congestion    Mild intermittent asthma without complication    Environmental and seasonal allergies    Update last OV Assessment and Plan    Chronic nasal congestion nasal polyps environmental allergies  On Budesonide  rinse and Zyrtec . Dupixent recommended to prevent polyp regrowth. Has not started it yet.  - Continue budesonide  nasal rinse daily. - Initiate Dupixent therapy. - Continue Zyrtec  for allergy management. - Irrigate  with steroid in the morning and saline rinse at night during pollen season to remove nasal crusting and secretions. - Refill Zyrtec  and Pepcid  prescriptions.  Gastroesophageal reflux disease (GERD) Pepcid  managing symptoms effectively. - Continue Pepcid .  - diet and lifestyle changes to minimize reflux     Update 04/05/24 Assessment and Plan Assessment & Plan Chronic nasal congestion s/p Septo/ITR nad nasal polyp removal Small perforation in the middle of the septum, without crusting or epistaxis  He reports improved nasal breathing. Not expected to cause significant issues. - Continue nasal rinses with added steroid. Refill sent.  - Continue Zyrtec  or switch to Xyzal if needed. Refill sent  - RTC 1 year         Elena Larry, MD Otolaryngology [facility] Phone: [phone] Fax: [phone]    04/05/2024, 9:23 AM

## 2024-04-19 DIAGNOSIS — E113312 Type 2 diabetes mellitus with moderate nonproliferative diabetic retinopathy with macular edema, left eye: Secondary | ICD-10-CM | POA: Diagnosis not present

## 2024-04-19 DIAGNOSIS — H40023 Open angle with borderline findings, high risk, bilateral: Secondary | ICD-10-CM | POA: Diagnosis not present

## 2024-04-30 ENCOUNTER — Other Ambulatory Visit: Payer: Self-pay | Admitting: Emergency Medicine

## 2024-04-30 ENCOUNTER — Other Ambulatory Visit (INDEPENDENT_AMBULATORY_CARE_PROVIDER_SITE_OTHER): Payer: Self-pay | Admitting: Otolaryngology

## 2024-04-30 DIAGNOSIS — I10 Essential (primary) hypertension: Secondary | ICD-10-CM

## 2024-04-30 DIAGNOSIS — I152 Hypertension secondary to endocrine disorders: Secondary | ICD-10-CM

## 2024-04-30 DIAGNOSIS — I255 Ischemic cardiomyopathy: Secondary | ICD-10-CM

## 2024-04-30 DIAGNOSIS — E1169 Type 2 diabetes mellitus with other specified complication: Secondary | ICD-10-CM

## 2024-04-30 DIAGNOSIS — E1165 Type 2 diabetes mellitus with hyperglycemia: Secondary | ICD-10-CM

## 2024-06-01 ENCOUNTER — Encounter: Payer: Self-pay | Admitting: Emergency Medicine

## 2024-06-01 ENCOUNTER — Ambulatory Visit: Payer: Self-pay | Admitting: Emergency Medicine

## 2024-06-01 ENCOUNTER — Ambulatory Visit: Admitting: Emergency Medicine

## 2024-06-01 VITALS — BP 122/82 | HR 69 | Temp 98.0°F | Ht 69.0 in | Wt 191.0 lb

## 2024-06-01 DIAGNOSIS — E1169 Type 2 diabetes mellitus with other specified complication: Secondary | ICD-10-CM | POA: Diagnosis not present

## 2024-06-01 DIAGNOSIS — Z7984 Long term (current) use of oral hypoglycemic drugs: Secondary | ICD-10-CM

## 2024-06-01 DIAGNOSIS — R053 Chronic cough: Secondary | ICD-10-CM | POA: Insufficient documentation

## 2024-06-01 DIAGNOSIS — E785 Hyperlipidemia, unspecified: Secondary | ICD-10-CM | POA: Diagnosis not present

## 2024-06-01 DIAGNOSIS — E113412 Type 2 diabetes mellitus with severe nonproliferative diabetic retinopathy with macular edema, left eye: Secondary | ICD-10-CM

## 2024-06-01 DIAGNOSIS — E1159 Type 2 diabetes mellitus with other circulatory complications: Secondary | ICD-10-CM

## 2024-06-01 DIAGNOSIS — I152 Hypertension secondary to endocrine disorders: Secondary | ICD-10-CM | POA: Diagnosis not present

## 2024-06-01 DIAGNOSIS — E113491 Type 2 diabetes mellitus with severe nonproliferative diabetic retinopathy without macular edema, right eye: Secondary | ICD-10-CM

## 2024-06-01 LAB — COMPREHENSIVE METABOLIC PANEL WITH GFR
ALT: 27 U/L (ref 0–53)
AST: 24 U/L (ref 0–37)
Albumin: 3.9 g/dL (ref 3.5–5.2)
Alkaline Phosphatase: 50 U/L (ref 39–117)
BUN: 11 mg/dL (ref 6–23)
CO2: 29 meq/L (ref 19–32)
Calcium: 8.8 mg/dL (ref 8.4–10.5)
Chloride: 105 meq/L (ref 96–112)
Creatinine, Ser: 0.88 mg/dL (ref 0.40–1.50)
GFR: 86.61 mL/min (ref >60.00)
Glucose, Bld: 143 mg/dL — ABNORMAL HIGH (ref 70–99)
Potassium: 4.2 meq/L (ref 3.5–5.1)
Sodium: 140 meq/L (ref 135–145)
Total Bilirubin: 0.6 mg/dL (ref 0.2–1.2)
Total Protein: 6.1 g/dL (ref 6.0–8.3)

## 2024-06-01 LAB — HEMOGLOBIN A1C: Hgb A1c MFr Bld: 6.9 % — ABNORMAL HIGH (ref 4.6–6.5)

## 2024-06-01 NOTE — Assessment & Plan Note (Signed)
 Well-controlled diabetes with hemoglobin A1c of 6.9 Continue Farxiga  and continue glipizide  and metformin  Continue rosuvastatin  20 mg daily Diet and nutrition discussed

## 2024-06-01 NOTE — Progress Notes (Signed)
 Scott Dodson 73 y.o.   Chief Complaint  Patient presents with   Follow-up    Pt states that he has a cough for about 2 months     HISTORY OF PRESENT ILLNESS: This is a 72 y.o. male here for 89-month follow-up of chronic medical conditions including hypertension diabetes Also complaining of cough at times productive of greenish phlegm for the last 2 months Denies difficulty breathing.  No longer smoking.  Denies fever or chills. No other associated symptoms No other complaints or medical concerns today. History of hypertension, has been on lisinopril  for a long time.  HPI   Prior to Admission medications   Medication Sig Start Date End Date Taking? Authorizing Provider  acetaminophen  (TYLENOL ) 500 MG tablet Take 1 tablet (500 mg total) by mouth every 6 (six) hours. Take every 6 hrs x 2-3 days then take every 6 hrs as needed 08/20/23   Soldatova, Liuba, MD  albuterol  (VENTOLIN  HFA) 108 (90 Base) MCG/ACT inhaler Inhale 2 puffs into the lungs every 6 (six) hours as needed for wheezing or shortness of breath. 06/30/22   Jillian Buttery, MD  amLODipine  (NORVASC ) 5 MG tablet TAKE 1 TABLET BY MOUTH DAILY 01/09/24   Purcell Emil Schanz, MD  Ascorbic Acid (VITAMIN C) 1000 MG tablet Take 2,000 mg by mouth daily.    [provider]  aspirin  81 MG tablet Take 81 mg by mouth daily.    [provider]  budesonide  (PULMICORT ) 0.5 MG/2ML nebulizer solution Take 0.5 mg by nebulization daily as needed (Asthma). 05/05/23   [provider]  budesonide  (PULMICORT ) 0.5 MG/2ML nebulizer solution Take 2 mLs (0.5 mg total) by nebulization in the morning and at bedtime. MIX 1 VIAL WITH 250CC OF NORMAL SALINE FOR SINUS WASH DAILY AS NEEDED 04/05/24   Soldatova, Liuba, MD  carvedilol  (COREG ) 25 MG tablet TAKE 1 TABLET BY MOUTH TWICE  DAILY WITH MEALS 04/30/24   Allana Shrestha, Emil Schanz, MD  cetirizine  (ZYRTEC ) 10 MG tablet Take 10 mg by mouth daily as needed for allergies.    [provider]  Coenzyme Q10 (CO Q 10 PO) Take 1 tablet by mouth daily.    [provider]  dapagliflozin  propanediol (FARXIGA ) 10 MG TABS tablet Take 1 tablet (10 mg total) by mouth daily before breakfast. 11/25/23   Purcell Emil Schanz, MD  famotidine  (PEPCID ) 20 MG tablet Take 1 tablet (20 mg total) by mouth 2 (two) times daily. 10/02/23   Soldatova, Liuba, MD  fluticasone  (FLONASE ) 50 MCG/ACT nasal spray Place 2 sprays into both nostrils daily. 03/13/23   Soldatova, Liuba, MD  glipiZIDE  (GLUCOTROL ) 5 MG tablet TAKE 1 TABLET BY MOUTH DAILY  WITH BREAKFAST 04/30/24   Purcell Emil Schanz, MD  levocetirizine (XYZAL  ALLERGY 24HR) 5 MG tablet Take 1 tablet (5 mg total) by mouth every evening. 04/05/24   Soldatova, Liuba, MD  lisinopril  (ZESTRIL ) 40 MG tablet TAKE 1 TABLET BY MOUTH DAILY 04/30/24   Purcell Emil Schanz, MD  metFORMIN  (GLUCOPHAGE ) 500 MG tablet TAKE 1 TABLET BY MOUTH TWICE  DAILY WITH MEALS 04/30/24   Paulo Keimig, Emil Schanz, MD  oxyCODONE  (ROXICODONE ) 5 MG immediate release tablet Take 1 tablet (5 mg total) by mouth every 6 (six) hours as needed for severe pain (pain score 7-10). 08/20/23   Soldatova, Liuba, MD  oxymetazoline  (AFRIN NASAL SPRAY) 0.05 % nasal spray Place 2 sprays into both nostrils 2 (two) times daily as needed for congestion. Use for epistaxis as needed avoid using for  more than 48 hrs 08/26/23   Okey Burns, MD  rosuvastatin  (CRESTOR ) 20 MG tablet TAKE 1 TABLET BY MOUTH DAILY 04/30/24   Purcell Emil Schanz, MD    No Known Allergies  Patient Active Problem List   Diagnosis Date Noted   Chronic nasal congestion 08/20/2023   Nasal obstruction 08/20/2023   Hypertrophy of both inferior nasal turbinates 08/20/2023   Nasal septal deviation 08/20/2023   Nasal polyps 08/20/2023   HLD (hyperlipidemia) 06/28/2022   HTN (hypertension) 06/28/2022   Ischemic cardiomyopathy 05/21/2022   Esophageal dysphagia 05/21/2022   Chronic bilateral low back pain without  sciatica 05/21/2022   Chronic left hip pain 05/21/2022   Age-related nuclear cataract of both eyes 02/05/2021   Vitreomacular adhesion of left eye 12/13/2019   Severe nonproliferative diabetic retinopathy of right eye without macular edema associated with type 2 diabetes mellitus (HCC) 12/13/2019   Severe nonproliferative diabetic retinopathy of left eye, with macular edema, associated with type 2 diabetes mellitus (HCC) 12/13/2019   Vitreomacular adhesion of right eye 12/13/2019   History of diabetes mellitus 02/09/2018   CARDIOMYOPATHY, ISCHEMIC 05/18/2010   Coronary atherosclerosis 10/11/2009   Dyslipidemia associated with type 2 diabetes mellitus (HCC) 10/09/2009   HYPERLIPIDEMIA 10/09/2009   Hypertension associated with diabetes (HCC) 10/09/2009   MYOCARDIAL INFARCTION, HX OF 10/09/2009    Past Medical History:  Diagnosis Date   Allergy    Asthma    Coronary artery disease    s/p lateral MI 4 /11, s/p Promus DES OM2   Diabetes mellitus without complication (HCC)    Heart murmur    History of pneumonia    Hyperlipidemia    Hypertension    Myocardial infarct [facility] 2011    Past Surgical History:  Procedure Laterality Date   ADENOIDECTOMY     COLONOSCOPY     CORONARY STENT PLACEMENT  2011   MAXILLARY ANTROSTOMY Bilateral 08/20/2023   Procedure: MAXILLARY ANTROSTOMY;  Surgeon: Okey Burns, MD;  Location: [facility] OR;  Service: ENT;  Laterality: Bilateral;   NASAL ENDOSCOPY Bilateral 08/20/2023   Procedure: NASAL ENDOSCOPY;  Surgeon: Okey Burns, MD;  Location: [facility] OR;  Service: ENT;  Laterality: Bilateral;   POLYPECTOMY Bilateral 08/20/2023   Procedure: POLYPECTOMY NASAL;  Surgeon: Okey Burns, MD;  Location: [facility] OR;  Service: ENT;  Laterality: Bilateral;   SEPTOPLASTY N/A 08/20/2023   Procedure: SEPTOPLASTY;  Surgeon: Okey Burns, MD;  Location: [facility] OR;  Service: ENT;  Laterality: N/A;   SINUS ENDO WITH FUSION N/A 08/20/2023   Procedure: SINUS ENDO WITH FUSION;   Surgeon: Okey Burns, MD;  Location: [facility] OR;  Service: ENT;  Laterality: N/A;   TONSILLECTOMY     TURBINATE REDUCTION Bilateral 08/20/2023   Procedure: TURBINATE REDUCTION;  Surgeon: Okey Burns, MD;  Location: [facility] OR;  Service: ENT;  Laterality: Bilateral;   TYMPANIC MEMBRANE REPAIR Right 2004   UPPER GASTROINTESTINAL ENDOSCOPY     VASECTOMY      Social History   Socioeconomic History   Marital status: Married    Spouse name: Not on file   Number of children: 3   Years of education: Not on file   Highest education level: Not on file  Occupational History   Occupation: company secretary   Occupation: retired  Tobacco Use   Smoking status: Former    Current packs/day: 0.00    Average packs/day: 0.5 packs/day for 5.0 years (2.5 ttl pk-yrs)    Types: Cigarettes    Start date: 10/13/1969  Quit date: 10/14/1974    Years since quitting: 49.6   Smokeless tobacco: Never  Vaping Use   Vaping status: Never Used  Substance and Sexual Activity   Alcohol use: Yes    Alcohol/week: 5.0 standard drinks of alcohol    Types: 5 Standard drinks or equivalent per week    Comment: 1-2 drink daily   Drug use: No   Sexual activity: Yes  Other Topics Concern   Not on file  Social History Narrative   Occupation- writer    Former -tobacco -stopped 1979   Married ,3 children     Alcohol use - yes , social    Drug use - no   Regular exercise- yes    Social Drivers of Corporate Investment Banker Strain: Low Risk  (12/31/2023)   Overall Financial Resource Strain (CARDIA)    Difficulty of Paying Living Expenses: Not hard at all  Food Insecurity: No Food Insecurity (12/31/2023)   Hunger Vital Sign    Worried About Running Out of Food in the Last Year: Never true    Ran Out of Food in the Last Year: Never true  Transportation Needs: No Transportation Needs (12/31/2023)   PRAPARE - Administrator, Civil Service (Medical): No    Lack of Transportation  (Non-Medical): No  Physical Activity: Sufficiently Active (12/31/2023)   Exercise Vital Sign    Days of Exercise per Week: 3 days    Minutes of Exercise per Session: 120 min  Stress: No Stress Concern Present (12/31/2023)   [employer] of Occupational Health - Occupational Stress Questionnaire    Feeling of Stress: Not at all  Social Connections: Moderately Integrated (12/31/2023)   Social Connection and Isolation Panel    Frequency of Communication with Friends and Family: Once a week    Frequency of Social Gatherings with Friends and Family: Once a week    Attends Religious Services: More than 4 times per year    Active Member of [employer] or Organizations: Yes    Attends Banker Meetings: 1 to 4 times per year    Marital Status: Married  Catering Manager Violence: Not At Risk (12/31/2023)   Humiliation, Afraid, Rape, and Kick questionnaire    Fear of Current or Ex-Partner: No    Emotionally Abused: No    Physically Abused: No    Sexually Abused: No    Family History  Problem Relation Age of Onset   Heart disease Mother    Hypertension Mother    Heart disease Father    Hypertension Father    Heart disease Brother    Heart disease Sister    Colon cancer Maternal Grandfather 18   Colon polyps Neg Hx    Esophageal cancer Neg Hx    Stomach cancer Neg Hx    Rectal cancer Neg Hx      Review of Systems  Constitutional: Negative.  Negative for chills and fever.  HENT: Negative.  Negative for congestion and sore throat.   Respiratory:  Positive for cough.   Cardiovascular: Negative.  Negative for chest pain and palpitations.  Gastrointestinal:  Negative for abdominal pain, diarrhea, nausea and vomiting.  Genitourinary: Negative.  Negative for dysuria and hematuria.  Skin: Negative.  Negative for rash.  Neurological: Negative.  Negative for dizziness and headaches.  All other systems reviewed and are negative.   Today's Vitals   06/01/24 0812  BP: 122/82  Pulse:  69  Temp: 98 F (36.7 C)  TempSrc: Oral  SpO2: 95%  Weight: 191 lb (86.6 kg)  Height: 5' 9 (1.753 m)   Body mass index is 28.21 kg/m.   Physical Exam Vitals reviewed.  Constitutional:      Appearance: Normal appearance.  HENT:     Head: Normocephalic.     Mouth/Throat:     Mouth: Mucous membranes are moist.     Pharynx: Oropharynx is clear.  Eyes:     Extraocular Movements: Extraocular movements intact.     Conjunctiva/sclera: Conjunctivae normal.     Pupils: Pupils are equal, round, and reactive to light.  Cardiovascular:     Rate and Rhythm: Normal rate and regular rhythm.     Pulses: Normal pulses.     Heart sounds: Normal heart sounds.  Pulmonary:     Effort: Pulmonary effort is normal.     Breath sounds: Normal breath sounds.  Musculoskeletal:     Cervical back: No tenderness.  Lymphadenopathy:     Cervical: No cervical adenopathy.  Skin:    General: Skin is warm and dry.  Neurological:     General: No focal deficit present.     Mental Status: He is alert and oriented to person, place, and time.      ASSESSMENT & PLAN: A total of 42 minutes was spent with the patient and counseling/coordination of care regarding preparing for this visit, review of most recent office visit notes, review of multiple chronic medical conditions and their management, cardiovascular risks associated with hypertension and diabetes, review of all medications, review of most recent bloodwork results including interpretation of today's hemoglobin A1c, review of health maintenance items, education on nutrition, prognosis, documentation, and need for follow up.  Problem List Items Addressed This Visit       Cardiovascular and Mediastinum   Hypertension associated with diabetes (HCC) - Primary   BP Readings from Last 3 Encounters:  06/01/24 122/82  04/05/24 121/67  11/25/23 118/80  Well-controlled hypertension Continue amlodipine  5 mg, lisinopril  40 mg, and carvedilol  25 mg twice a  day  Well-controlled diabetes with hemoglobin A1c of 6.9. Continue Farxiga  10 mg daily, metformin  500 mg twice a day and glipizide  5 mg twice a day Cardiovascular risks associated with diabetes and dyslipidemia discussed Diet and nutrition discussed Recommend follow-up in 3 months       Relevant Orders   Hemoglobin A1c (Completed)   Comprehensive metabolic panel with GFR (Completed)     Endocrine   Dyslipidemia associated with type 2 diabetes mellitus (HCC)   Well-controlled diabetes with hemoglobin A1c of 6.9 Continue Farxiga  and continue glipizide  and metformin  Continue rosuvastatin  20 mg daily Diet and nutrition discussed      Relevant Orders   Hemoglobin A1c (Completed)   Comprehensive metabolic panel with GFR (Completed)   Severe nonproliferative diabetic retinopathy of right eye without macular edema associated with type 2 diabetes mellitus (HCC)   Stable.  No signs of progression. Follows up with ophthalmologist on a regular basis.      Severe nonproliferative diabetic retinopathy of left eye, with macular edema, associated with type 2 diabetes mellitus (HCC)     Other   Chronic cough   Clinical stable.  Differential diagnosis discussed Most likely viral upper respiratory infection On lisinopril  for many years.  May be contributing Recommend chest x-ray today Will review images when available Cough management discussed      Relevant Orders   DG Chest 2 View   Patient Instructions  Health Maintenance After Age 81 After  age 41, you are at a higher risk for certain long-term diseases and infections as well as injuries from falls. Falls are a major cause of broken bones and head injuries in people who are older than age 52. Getting regular preventive care can help to keep you healthy and well. Preventive care includes getting regular testing and making lifestyle changes as recommended by your health care provider. Talk with your health care provider about: Which  screenings and tests you should have. A screening is a test that checks for a disease when you have no symptoms. A diet and exercise plan that is right for you. What should I know about screenings and tests to prevent falls? Screening and testing are the best ways to find a health problem early. Early diagnosis and treatment give you the best chance of managing medical conditions that are common after age 44. Certain conditions and lifestyle choices may make you more likely to have a fall. Your health care provider may recommend: Regular vision checks. Poor vision and conditions such as cataracts can make you more likely to have a fall. If you wear glasses, make sure to get your prescription updated if your vision changes. Medicine review. Work with your health care provider to regularly review all of the medicines you are taking, including over-the-counter medicines. Ask your health care provider about any side effects that may make you more likely to have a fall. Tell your health care provider if any medicines that you take make you feel dizzy or sleepy. Strength and balance checks. Your health care provider may recommend certain tests to check your strength and balance while standing, walking, or changing positions. Foot health exam. Foot pain and numbness, as well as not wearing proper footwear, can make you more likely to have a fall. Screenings, including: Osteoporosis screening. Osteoporosis is a condition that causes the bones to get weaker and break more easily. Blood pressure screening. Blood pressure changes and medicines to control blood pressure can make you feel dizzy. Depression screening. You may be more likely to have a fall if you have a fear of falling, feel depressed, or feel unable to do activities that you used to do. Alcohol use screening. Using too much alcohol can affect your balance and may make you more likely to have a fall. Follow these instructions at home: Lifestyle Do  not drink alcohol if: Your health care provider tells you not to drink. If you drink alcohol: Limit how much you have to: 0-1 drink a day for women. 0-2 drinks a day for men. Know how much alcohol is in your drink. In the [place] one drink equals one 12 oz bottle of beer (355 mL), one 5 oz glass of wine (148 mL), or one 1 oz glass of hard liquor (44 mL). Do not use any products that contain nicotine or tobacco. These products include cigarettes, chewing tobacco, and vaping devices, such as e-cigarettes. If you need help quitting, ask your health care provider. Activity  Follow a regular exercise program to stay fit. This will help you maintain your balance. Ask your health care provider what types of exercise are appropriate for you. If you need a cane or walker, use it as recommended by your health care provider. Wear supportive shoes that have nonskid soles. Safety  Remove any tripping hazards, such as rugs, cords, and clutter. Install safety equipment such as grab bars in bathrooms and safety rails on stairs. Keep rooms and walkways well-lit. General instructions Talk with  your health care provider about your risks for falling. Tell your health care provider if: You fall. Be sure to tell your health care provider about all falls, even ones that seem minor. You feel dizzy, tiredness (fatigue), or off-balance. Take over-the-counter and prescription medicines only as told by your health care provider. These include supplements. Eat a healthy diet and maintain a healthy weight. A healthy diet includes low-fat dairy products, low-fat (lean) meats, and fiber from whole grains, beans, and lots of fruits and vegetables. Stay current with your vaccines. Schedule regular health, dental, and eye exams. Summary Having a healthy lifestyle and getting preventive care can help to protect your health and wellness after age 82. Screening and testing are the best way to find a health problem early and  help you avoid having a fall. Early diagnosis and treatment give you the best chance for managing medical conditions that are more common for people who are older than age 42. Falls are a major cause of broken bones and head injuries in people who are older than age 35. Take precautions to prevent a fall at home. Work with your health care provider to learn what changes you can make to improve your health and wellness and to prevent falls. This information is not intended to replace advice given to you by your health care provider. Make sure you discuss any questions you have with your health care provider. Document Revised: 11/06/2020 Document Reviewed: 11/06/2020 Elsevier Patient Education  2024 Elsevier Inc.       Emil Schaumann, MD [facility]

## 2024-06-01 NOTE — Assessment & Plan Note (Signed)
 Stable.  No signs of progression. Follows up with ophthalmologist on a regular basis.

## 2024-06-01 NOTE — Patient Instructions (Signed)
 Health Maintenance After Age 71 After age 27, you are at a higher risk for certain long-term diseases and infections as well as injuries from falls. Falls are a major cause of broken bones and head injuries in people who are older than age 73. Getting regular preventive care can help to keep you healthy and well. Preventive care includes getting regular testing and making lifestyle changes as recommended by your health care provider. Talk with your health care provider about: Which screenings and tests you should have. A screening is a test that checks for a disease when you have no symptoms. A diet and exercise plan that is right for you. What should I know about screenings and tests to prevent falls? Screening and testing are the best ways to find a health problem early. Early diagnosis and treatment give you the best chance of managing medical conditions that are common after age 90. Certain conditions and lifestyle choices may make you more likely to have a fall. Your health care provider may recommend: Regular vision checks. Poor vision and conditions such as cataracts can make you more likely to have a fall. If you wear glasses, make sure to get your prescription updated if your vision changes. Medicine review. Work with your health care provider to regularly review all of the medicines you are taking, including over-the-counter medicines. Ask your health care provider about any side effects that may make you more likely to have a fall. Tell your health care provider if any medicines that you take make you feel dizzy or sleepy. Strength and balance checks. Your health care provider may recommend certain tests to check your strength and balance while standing, walking, or changing positions. Foot health exam. Foot pain and numbness, as well as not wearing proper footwear, can make you more likely to have a fall. Screenings, including: Osteoporosis screening. Osteoporosis is a condition that causes  the bones to get weaker and break more easily. Blood pressure screening. Blood pressure changes and medicines to control blood pressure can make you feel dizzy. Depression screening. You may be more likely to have a fall if you have a fear of falling, feel depressed, or feel unable to do activities that you used to do. Alcohol  use screening. Using too much alcohol  can affect your balance and may make you more likely to have a fall. Follow these instructions at home: Lifestyle Do not drink alcohol  if: Your health care provider tells you not to drink. If you drink alcohol : Limit how much you have to: 0-1 drink a day for women. 0-2 drinks a day for men. Know how much alcohol  is in your drink. In the [place] one drink equals one 12 oz bottle of beer (355 mL), one 5 oz glass of wine (148 mL), or one 1 oz glass of hard liquor (44 mL). Do not use any products that contain nicotine or tobacco. These products include cigarettes, chewing tobacco, and vaping devices, such as e-cigarettes. If you need help quitting, ask your health care provider. Activity  Follow a regular exercise program to stay fit. This will help you maintain your balance. Ask your health care provider what types of exercise are appropriate for you. If you need a cane or walker, use it as recommended by your health care provider. Wear supportive shoes that have nonskid soles. Safety  Remove any tripping hazards, such as rugs, cords, and clutter. Install safety equipment such as grab bars in bathrooms and safety rails on stairs. Keep rooms and walkways  well-lit. General instructions Talk with your health care provider about your risks for falling. Tell your health care provider if: You fall. Be sure to tell your health care provider about all falls, even ones that seem minor. You feel dizzy, tiredness (fatigue), or off-balance. Take over-the-counter and prescription medicines only as told by your health care provider. These include  supplements. Eat a healthy diet and maintain a healthy weight. A healthy diet includes low-fat dairy products, low-fat (lean) meats, and fiber from whole grains, beans, and lots of fruits and vegetables. Stay current with your vaccines. Schedule regular health, dental, and eye exams. Summary Having a healthy lifestyle and getting preventive care can help to protect your health and wellness after age 15. Screening and testing are the best way to find a health problem early and help you avoid having a fall. Early diagnosis and treatment give you the best chance for managing medical conditions that are more common for people who are older than age 42. Falls are a major cause of broken bones and head injuries in people who are older than age 64. Take precautions to prevent a fall at home. Work with your health care provider to learn what changes you can make to improve your health and wellness and to prevent falls. This information is not intended to replace advice given to you by your health care provider. Make sure you discuss any questions you have with your health care provider. Document Revised: 11/06/2020 Document Reviewed: 11/06/2020 Elsevier Patient Education  2024 [employer].

## 2024-06-01 NOTE — Assessment & Plan Note (Signed)
 Clinical stable.  Differential diagnosis discussed Most likely viral upper respiratory infection On lisinopril  for many years.  May be contributing Recommend chest x-ray today Will review images when available Cough management discussed

## 2024-06-01 NOTE — Assessment & Plan Note (Signed)
 BP Readings from Last 3 Encounters:  06/01/24 122/82  04/05/24 121/67  11/25/23 118/80  Well-controlled hypertension Continue amlodipine  5 mg, lisinopril  40 mg, and carvedilol  25 mg twice a day  Well-controlled diabetes with hemoglobin A1c of 6.9. Continue Farxiga  10 mg daily, metformin  500 mg twice a day and glipizide  5 mg twice a day Cardiovascular risks associated with diabetes and dyslipidemia discussed Diet and nutrition discussed Recommend follow-up in 3 months

## 2024-06-03 ENCOUNTER — Ambulatory Visit (INDEPENDENT_AMBULATORY_CARE_PROVIDER_SITE_OTHER)

## 2024-06-03 DIAGNOSIS — R053 Chronic cough: Secondary | ICD-10-CM | POA: Diagnosis not present

## 2024-11-30 ENCOUNTER — Ambulatory Visit: Admitting: Emergency Medicine

## 2025-01-03 ENCOUNTER — Ambulatory Visit
